# Patient Record
Sex: Female | Born: 1950 | Race: White | Hispanic: No | Smoking: Current every day smoker
Health system: Southern US, Community
[De-identification: ages and names within clinical notes are randomized; demographics above are authoritative.]

## PROBLEM LIST (undated history)

## (undated) DIAGNOSIS — Z961 Presence of intraocular lens: Secondary | ICD-10-CM

## (undated) DIAGNOSIS — Z9289 Personal history of other medical treatment: Secondary | ICD-10-CM

## (undated) DIAGNOSIS — I10 Essential (primary) hypertension: Secondary | ICD-10-CM

## (undated) DIAGNOSIS — F329 Major depressive disorder, single episode, unspecified: Secondary | ICD-10-CM

## (undated) DIAGNOSIS — F32A Depression, unspecified: Secondary | ICD-10-CM

## (undated) DIAGNOSIS — C4491 Basal cell carcinoma of skin, unspecified: Secondary | ICD-10-CM

## (undated) DIAGNOSIS — M549 Dorsalgia, unspecified: Secondary | ICD-10-CM

## (undated) DIAGNOSIS — M5432 Sciatica, left side: Secondary | ICD-10-CM

## (undated) DIAGNOSIS — H43813 Vitreous degeneration, bilateral: Secondary | ICD-10-CM

## (undated) DIAGNOSIS — H25813 Combined forms of age-related cataract, bilateral: Secondary | ICD-10-CM

## (undated) DIAGNOSIS — G459 Transient cerebral ischemic attack, unspecified: Secondary | ICD-10-CM

## (undated) DIAGNOSIS — H52209 Unspecified astigmatism, unspecified eye: Secondary | ICD-10-CM

## (undated) DIAGNOSIS — G5700 Lesion of sciatic nerve, unspecified lower limb: Secondary | ICD-10-CM

## (undated) DIAGNOSIS — R32 Unspecified urinary incontinence: Secondary | ICD-10-CM

## (undated) DIAGNOSIS — J309 Allergic rhinitis, unspecified: Secondary | ICD-10-CM

## (undated) DIAGNOSIS — G379 Demyelinating disease of central nervous system, unspecified: Secondary | ICD-10-CM

## (undated) DIAGNOSIS — H353131 Nonexudative age-related macular degeneration, bilateral, early dry stage: Secondary | ICD-10-CM

## (undated) DIAGNOSIS — H5052 Exophoria: Secondary | ICD-10-CM

## (undated) DIAGNOSIS — L409 Psoriasis, unspecified: Secondary | ICD-10-CM

## (undated) DIAGNOSIS — Z780 Asymptomatic menopausal state: Secondary | ICD-10-CM

## (undated) DIAGNOSIS — H527 Unspecified disorder of refraction: Secondary | ICD-10-CM

## (undated) DIAGNOSIS — N6019 Diffuse cystic mastopathy of unspecified breast: Secondary | ICD-10-CM

## (undated) DIAGNOSIS — IMO0002 Reserved for concepts with insufficient information to code with codable children: Secondary | ICD-10-CM

## (undated) DIAGNOSIS — K219 Gastro-esophageal reflux disease without esophagitis: Secondary | ICD-10-CM

## (undated) HISTORY — DX: Gastro-esophageal reflux disease without esophagitis: K21.9

## (undated) HISTORY — DX: Asymptomatic menopausal state: Z78.0

## (undated) HISTORY — DX: Unspecified disorder of refraction: H52.7

## (undated) HISTORY — DX: Demyelinating disease of central nervous system, unspecified: G37.9

## (undated) HISTORY — DX: Transient cerebral ischemic attack, unspecified: G45.9

## (undated) HISTORY — DX: Depression, unspecified: F32.A

## (undated) HISTORY — DX: Psoriasis, unspecified: L40.9

## (undated) HISTORY — DX: Unspecified astigmatism, unspecified eye: H52.209

## (undated) HISTORY — DX: Dorsalgia, unspecified: M54.9

## (undated) HISTORY — DX: Major depressive disorder, single episode, unspecified: F32.9

## (undated) HISTORY — PX: TONSILLECTOMY AND ADENOIDECTOMY: SHX28

## (undated) HISTORY — DX: Reserved for concepts with insufficient information to code with codable children: IMO0002

## (undated) HISTORY — DX: Nonexudative age-related macular degeneration, bilateral, early dry stage: H35.3131

## (undated) HISTORY — DX: Vitreous degeneration, bilateral: H43.813

## (undated) HISTORY — DX: Essential (primary) hypertension: I10

## (undated) HISTORY — DX: Combined forms of age-related cataract, bilateral: H25.813

## (undated) HISTORY — DX: Lesion of sciatic nerve, unspecified lower limb: G57.00

## (undated) HISTORY — DX: Unspecified urinary incontinence: R32

## (undated) HISTORY — DX: Exophoria: H50.52

## (undated) HISTORY — DX: Diffuse cystic mastopathy of unspecified breast: N60.19

## (undated) HISTORY — DX: Sciatica, left side: M54.32

## (undated) HISTORY — DX: Allergic rhinitis, unspecified: J30.9

## (undated) HISTORY — DX: Basal cell carcinoma of skin, unspecified: C44.91

## (undated) HISTORY — DX: Personal history of other medical treatment: Z92.89

## (undated) HISTORY — DX: Presence of intraocular lens: Z96.1

---

## 1997-07-15 HISTORY — PX: SKIN BIOPSY: SHX1

## 1998-07-15 DIAGNOSIS — G459 Transient cerebral ischemic attack, unspecified: Secondary | ICD-10-CM

## 1998-07-15 HISTORY — DX: Transient cerebral ischemic attack, unspecified: G45.9

## 2001-07-15 DIAGNOSIS — G379 Demyelinating disease of central nervous system, unspecified: Secondary | ICD-10-CM

## 2001-07-15 HISTORY — DX: Demyelinating disease of central nervous system, unspecified: G37.9

## 2001-11-12 HISTORY — PX: CHOLECYSTECTOMY: SHX55

## 2005-04-14 HISTORY — PX: LUMBAR DISC SURGERY: SHX700

## 2007-07-16 DIAGNOSIS — L409 Psoriasis, unspecified: Secondary | ICD-10-CM

## 2007-07-16 HISTORY — DX: Psoriasis, unspecified: L40.9

## 2012-12-13 DIAGNOSIS — Z9289 Personal history of other medical treatment: Secondary | ICD-10-CM

## 2012-12-13 HISTORY — DX: Personal history of other medical treatment: Z92.89

## 2013-05-15 DIAGNOSIS — M5432 Sciatica, left side: Secondary | ICD-10-CM

## 2013-05-15 DIAGNOSIS — G5700 Lesion of sciatic nerve, unspecified lower limb: Secondary | ICD-10-CM

## 2013-05-15 HISTORY — DX: Sciatica, left side: M54.32

## 2013-05-15 HISTORY — DX: Lesion of sciatic nerve, unspecified lower limb: G57.00

## 2014-06-22 DIAGNOSIS — I6781 Acute cerebrovascular insufficiency: Secondary | ICD-10-CM | POA: Diagnosis not present

## 2014-06-22 DIAGNOSIS — E782 Mixed hyperlipidemia: Secondary | ICD-10-CM | POA: Diagnosis not present

## 2014-06-22 DIAGNOSIS — Z6830 Body mass index (BMI) 30.0-30.9, adult: Secondary | ICD-10-CM | POA: Diagnosis not present

## 2014-06-22 DIAGNOSIS — I10 Essential (primary) hypertension: Secondary | ICD-10-CM | POA: Diagnosis not present

## 2014-06-22 DIAGNOSIS — M65311 Trigger thumb, right thumb: Secondary | ICD-10-CM | POA: Diagnosis not present

## 2014-07-04 DIAGNOSIS — M65311 Trigger thumb, right thumb: Secondary | ICD-10-CM | POA: Diagnosis not present

## 2014-07-04 DIAGNOSIS — M79644 Pain in right finger(s): Secondary | ICD-10-CM | POA: Diagnosis not present

## 2014-08-09 ENCOUNTER — Ambulatory Visit (INDEPENDENT_AMBULATORY_CARE_PROVIDER_SITE_OTHER): Payer: Medicare Other | Admitting: Podiatry

## 2014-08-09 ENCOUNTER — Encounter: Payer: Self-pay | Admitting: Podiatry

## 2014-08-09 VITALS — BP 121/81 | HR 63 | Resp 18

## 2014-08-09 DIAGNOSIS — L600 Ingrowing nail: Secondary | ICD-10-CM | POA: Diagnosis not present

## 2014-08-09 NOTE — Patient Instructions (Signed)

## 2014-08-09 NOTE — Progress Notes (Signed)
   Subjective:    Patient ID: [name], female    DOB: 06-Apr-1951, 64 y.o.   MRN: [id number]  HPI  64 year old female percent the office today with complaints of painful ingrown toenail to the left big toe. She states that she also has and discomfort to the right side on the left side is more severe. 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 She states that she started using a pedicure weekly Norditropin ingrown toenails. She states that her last appointment to get a pedicure they had difficulty trimming the left toenail and the area was very painful. She denies any recent redness or drainage from the nail site. No other complaints at this time.    Review of Systems  All other systems reviewed and are negative.      Objective:   Physical Exam AAO x3, NAD DP/PT pulses palpable bilaterally, CRT less than 3 seconds Protective sensation intact with Simms Weinstein monofilament, vibratory sensation intact, Achilles tendon reflex intact. Tenderness to palpation overlying the medial and lateral nail borders of the left hallux evidence of incurvation of ingrowing. There is no overlying erythema or increasing warmth. There is trace edema directly on the nail borders. No ascending cellulitis. There is no discomfort currently overlying the nail borders of the right hallux. The hallux nails do appear to be hypertrophic, dystrophic, discolored and brittle. Remaining nails do have slight discoloration to them as well. There is no tenderness along the lesser digit nails. No open lesions or pre-ulcer lesions identified. No other areas of tenderness to bilateral lower extremity. No other areas of edema, erythema, increased warmth bilateral lower extremities. MMT 5/5, ROM WNL No pain with calf compression, swelling, warmth, erythema.    Assessment & Plan:  64 year old female with symptomatic ingrown toenail left medial lateral  nail borders. -Treatment options were discussed the patient including alternatives, risks, complications. -Discussed the patient partial nail avulsion with chemical matricectomy to both medial and lateral nail borders of left hallux. Risks and complications of the procedure were discussed the patient for which she understands verbally consents to the procedure. Once the site was properly identified the left hallux was then infiltrated with 3 mL of a one-to-one mixture of 2% lidocaine plain and 0.5% Marcaine plain. Once anesthetized, the skin was then prepped in sterile fashion. A tourniquet was then applied. Next the medial and lateral nail borders were then sharply excised making sure to remove the entire offending nail border. No underlying purulence is expressed. There is found to be significant amount of ingrowing on the nail borders. Once the nail borders were assured to be removed phenol was then applied under standard conditions and the copious irrigated. This was performed on both nail borders. Betadine ointment was then applied followed by a dry sterile dressing. After application of the dressing the tourniquet was removed and there is found to be an immediate capillary refill time to the digit. The patient tolerated the procedure well without any complications. Post procedure suctions were discussed the patient for which she verbally understood. Monitor for any signs or symptoms of infection and directed to call the office immediately should any occur. -Follow-up in 1 week or sooner should any problems arise. In the meantime, encouraged to call the office with any questions, concerns, change in symptoms.

## 2014-08-10 ENCOUNTER — Telehealth: Payer: Self-pay | Admitting: *Deleted

## 2014-08-10 NOTE — Telephone Encounter (Addendum)
Pt states there are multiple areas on her check-out sheet that are incorrect.  Pt states the only medications she takes are Aspirin 325mg  daily, Losartan HCTZ 100/25mg  daily and has NEVER taken Norco.  Pt also states  She quit smoking over 9 years ago and it was charted as NEVER smoked.  Pt states there was a B/P and pulse also for that day, that wasn't taken at that visit.  Pt would like these areas corrected.

## 2014-08-24 ENCOUNTER — Ambulatory Visit: Payer: Medicare Other | Admitting: Podiatry

## 2014-08-31 ENCOUNTER — Ambulatory Visit (INDEPENDENT_AMBULATORY_CARE_PROVIDER_SITE_OTHER): Payer: Medicare Other | Admitting: Podiatry

## 2014-08-31 ENCOUNTER — Encounter: Payer: Self-pay | Admitting: Podiatry

## 2014-08-31 VITALS — BP 149/81 | HR 84 | Resp 18

## 2014-08-31 DIAGNOSIS — L600 Ingrowing nail: Secondary | ICD-10-CM

## 2014-08-31 NOTE — Patient Instructions (Signed)

## 2014-09-01 NOTE — Progress Notes (Addendum)
Patient ID: [name], female   DOB: 1950-09-06, 64 y.o.   MRN: [id number]  Subjective: 64 year old female returns the office today requesting nail procedure to the right big toe. Last appointment she had ingrown toenail removed with chemical matricectomy on the left hallux for which she stated is healing well. She states that the area no longer has any pain. She denies any drainage or purulence from the area and denies any surrounding erythema or streaking. She states of the right hallux has continued to be painful and she would likie to proceed with the procedure at this time. Denies any systemic complaints such as fevers, chills, nausea, vomiting. No other complaints at this time in no acute changes.  Objective: AAO 3, NAD DP/PT pulses palpable, CRT less than 3 seconds Protective sensation intact with Simms Weinstein monofilament, vibratory sensation intact, Achilles tendon reflex intact. Left hallux status post medial/lateral partial nail excision which is healing well for this timeframe. Is a scab formed overlying the area. There is no tenderness to palpation. There is no swelling erythema, edema, increased warmth. No ascending cellulitis. No drainage or purulence. No malodor. Left second digit with incurvation along the lateral aspect of the nail with mild tenderness. There is mild erythema over the nail border directly without any ascending cellulitis. There is no drainage or purulence. Right hallux with evidence of ingrowing on both the medial and lateral nail borders. There is tenderness to palpation overlying these nail borders. There is no surrounding erythema or increase in warmth. There is trace edema directly overlying the nail borders. There is no drainage or purulence expressed. No malodor. Remaining nails without pathology. No other open lesions or pre-ulcerative lesions identified. No pain with calf compression, swelling, warmth, erythema.  Assessment: 64 year old female  presents for right symptomatic ingrown toenail  Plan: -At this time patient is requesting partial nail avulsions the chemical matricectomy to both the medial and lateral nail borders of the right hallux. Risks and complications of the procedure were discussed which understands and verbally consents. After the site was properly identified a total of 3 mL of a mixture of 2% lidocaine plain and 0.5% Marcaine plain was infiltrated in a hallux block fashion. Once anesthetized the skin was then prepped in a sterile fashion. A tourniquet was then applied. Next both the medial and lateral nail borders were then sharply excised making sure to remove the entire offending nail border. There is found to be extensive amount of ingrowing along the nail borders. Once the nails were shortened to be remove the area was debrided and the underlying skin was intact. There is no purulence or other signs of infection present. Hemostasis was achieved. Phenol was then applied under standard conditions and copiously irrigated. Betadine ointment was applied followed by dry sterile dressing. After application of the dressing the tourniquet was removed and there is found to be an immediate capillary refill time to the digit. Patient tolerated the procedure well without any complications. Post procedure instructions were discussed the patient for which she understood. -Left second digit nail debrided without complication/bleeding. Continue to monitor the area. Discussed that if the area worsens or does not improve would likely need to have partial nail avulsion. -Follow-up in 1 week or sooner if any problems are to arise. In the meantime, encouraged call the office with any questions, concerns, change in symptoms. Monitoring signs or symptoms of infection and directed to call the office immediately lesion any occur or go to the emergency room.

## 2014-09-14 ENCOUNTER — Encounter: Payer: Self-pay | Admitting: Podiatry

## 2014-09-14 ENCOUNTER — Ambulatory Visit (INDEPENDENT_AMBULATORY_CARE_PROVIDER_SITE_OTHER): Payer: Medicare Other | Admitting: Podiatry

## 2014-09-14 VITALS — BP 125/75 | HR 76 | Resp 18

## 2014-09-14 DIAGNOSIS — L600 Ingrowing nail: Secondary | ICD-10-CM

## 2014-09-14 NOTE — Patient Instructions (Signed)

## 2014-09-19 NOTE — Progress Notes (Signed)
Patient ID: [name], female   DOB: 1950-08-20, 64 y.o.   MRN: [id number]  Subjective: 64 year old female presents the office they for follow-up evaluation of bilateral hallux partial nail avulsions. She states that she is doing well and she has no pain overlying the area. She denies any drainage or purulence. Denies any redness. She does state than the right fifth toenail has started to become mildly painful. She states that she might of Brothers toe and the shoe causing some irritation. She denies any history of injury or trauma. She denies any redness or drainage line nail site. No other complaints at this time.  Objective: AAO 3, NAD DP/PT pulses palpable, CRT less than 3 seconds Protective sensation intact with Simms Weinstein monofilament, vibratory sensation intact, Achilles tendon reflex intact. Bilateral hallux status post partial nail avulsion which are healing well. There is a small amount of granulation tissue within the nail borders however overall the sites are healing well. There is no tenderness palpation and there is no drainage or purulence. There is no swelling erythema or ascending cellulitis. There is no areas of fluctuance or crepitus or malodor. No clinical signs of infection. On the right fifth toenail there is some evidence of a mild subungual hematoma which encompasses less than 25% of the nail. She denies any history of injury or trauma. There is mild incurvation on the nail border without any surrounding erythema or drainage or other clinical signs of infection. Remaining nails without pathology. No other areas of tenderness to bilateral lower extremity is. No overlying edema, erythema, increase in warmth. No pain with calf compression, swelling, warmth, erythema.  Assessment: 64 year old female status post bilateral hallux partial nail avulsion with right fifth digit toenail discomfort   Plan: -Treatment options were discussed including alternatives, risks,  complications. -Recommended continue soaking the hallux nails in Epson salts twice a day covered with antibiotic ointment and a Band-Aid. Continue this until area has completely healed. Can leave the area uncovered at night. -Right fifth digit nail was sharply debrided without complications to patient comfort. Discussed the patient closely monitor the area. The area was becomes symptomatic again or if there is any changes to call the office. -Follow-up as needed. In the meantime encouraged to call the office with any questions, concerns, changes symptoms.

## 2014-10-05 DIAGNOSIS — L600 Ingrowing nail: Secondary | ICD-10-CM

## 2014-12-22 DIAGNOSIS — I10 Essential (primary) hypertension: Secondary | ICD-10-CM | POA: Diagnosis not present

## 2014-12-22 DIAGNOSIS — R203 Hyperesthesia: Secondary | ICD-10-CM | POA: Diagnosis not present

## 2014-12-22 DIAGNOSIS — E782 Mixed hyperlipidemia: Secondary | ICD-10-CM | POA: Diagnosis not present

## 2014-12-27 DIAGNOSIS — Z0001 Encounter for general adult medical examination with abnormal findings: Secondary | ICD-10-CM | POA: Diagnosis not present

## 2014-12-27 DIAGNOSIS — Z1211 Encounter for screening for malignant neoplasm of colon: Secondary | ICD-10-CM | POA: Diagnosis not present

## 2014-12-27 DIAGNOSIS — Z124 Encounter for screening for malignant neoplasm of cervix: Secondary | ICD-10-CM | POA: Diagnosis not present

## 2016-01-03 ENCOUNTER — Ambulatory Visit: Payer: Self-pay | Admitting: Family Medicine

## 2016-01-04 ENCOUNTER — Encounter: Payer: Self-pay | Admitting: Family Medicine

## 2016-01-04 ENCOUNTER — Other Ambulatory Visit: Payer: Self-pay | Admitting: Family Medicine

## 2016-01-11 ENCOUNTER — Ambulatory Visit (INDEPENDENT_AMBULATORY_CARE_PROVIDER_SITE_OTHER): Payer: Medicare Other | Admitting: Family Medicine

## 2016-01-11 ENCOUNTER — Encounter: Payer: Self-pay | Admitting: Family Medicine

## 2016-01-11 VITALS — BP 137/88 | HR 73 | Temp 97.9°F | Resp 20 | Ht 64.0 in | Wt 192.8 lb

## 2016-01-11 DIAGNOSIS — Z7189 Other specified counseling: Secondary | ICD-10-CM

## 2016-01-11 DIAGNOSIS — Z6833 Body mass index (BMI) 33.0-33.9, adult: Secondary | ICD-10-CM

## 2016-01-11 DIAGNOSIS — Z8673 Personal history of transient ischemic attack (TIA), and cerebral infarction without residual deficits: Secondary | ICD-10-CM | POA: Diagnosis not present

## 2016-01-11 DIAGNOSIS — H6120 Impacted cerumen, unspecified ear: Secondary | ICD-10-CM | POA: Insufficient documentation

## 2016-01-11 DIAGNOSIS — H6123 Impacted cerumen, bilateral: Secondary | ICD-10-CM

## 2016-01-11 DIAGNOSIS — I10 Essential (primary) hypertension: Secondary | ICD-10-CM | POA: Insufficient documentation

## 2016-01-11 DIAGNOSIS — E782 Mixed hyperlipidemia: Secondary | ICD-10-CM | POA: Insufficient documentation

## 2016-01-11 DIAGNOSIS — Z7689 Persons encountering health services in other specified circumstances: Secondary | ICD-10-CM

## 2016-01-11 DIAGNOSIS — Z1211 Encounter for screening for malignant neoplasm of colon: Secondary | ICD-10-CM

## 2016-01-11 DIAGNOSIS — Z0000 Encounter for general adult medical examination without abnormal findings: Secondary | ICD-10-CM

## 2016-01-11 DIAGNOSIS — Z1159 Encounter for screening for other viral diseases: Secondary | ICD-10-CM

## 2016-01-11 MED ORDER — LOSARTAN POTASSIUM-HCTZ 100-25 MG PO TABS: 1.0000 | ORAL_TABLET | Freq: Every day | ORAL | Status: DC

## 2016-01-11 MED ORDER — CARBAMIDE PEROXIDE 6.5 % OT SOLN: 5.0000 [drp] | Freq: Two times a day (BID) | OTIC | Status: DC

## 2016-01-11 NOTE — Patient Instructions (Signed)
I have placed future lab orders for you to complete fasting when you return from vacation.  I have called in refills for BP medicine.  Information on cologuard (colon cancer screen) will be sent to you.  I have called in drops to help with ear wax removal   Health Maintenance, Female Adopting a healthy lifestyle and getting preventive care can go a long way to promote health and wellness. Talk with your health care provider about what schedule of regular examinations is right for you. This is a good chance for you to check in with your provider about disease prevention and staying healthy. In between checkups, there are plenty of things you can do on your own. Experts have done a lot of research about which lifestyle changes and preventive measures are most likely to keep you healthy. Ask your health care provider for more information. WEIGHT AND DIET  Eat a healthy diet  Be sure to include plenty of vegetables, fruits, low-fat dairy products, and lean protein.  Do not eat a lot of foods high in solid fats, added sugars, or salt.  Get regular exercise. This is one of the most important things you can do for your health.  Most adults should exercise for at least 150 minutes each week. The exercise should increase your heart rate and make you sweat (moderate-intensity exercise).  Most adults should also do strengthening exercises at least twice a week. This is in addition to the moderate-intensity exercise.  Maintain a healthy weight  Body mass index (BMI) is a measurement that can be used to identify possible weight problems. It estimates body fat based on height and weight. Your health care provider can help determine your BMI and help you achieve or maintain a healthy weight.  For females 23 years of age and older:   A BMI below 18.5 is considered underweight.  A BMI of 18.5 to 24.9 is normal.  A BMI of 25 to 29.9 is considered overweight.  A BMI of 30 and above is considered  obese.  Watch levels of cholesterol and blood lipids  You should start having your blood tested for lipids and cholesterol at 67 years of age, then have this test every 5 years.  You may need to have your cholesterol levels checked more often if:  Your lipid or cholesterol levels are high.  You are older than 67 years of age.  You are at high risk for heart disease.  CANCER SCREENING   Lung Cancer  Lung cancer screening is recommended for adults 67-70 years old who are at high risk for lung cancer because of a history of smoking.  A yearly low-dose CT scan of the lungs is recommended for people who:  Currently smoke.  Have quit within the past 15 years.  Have at least a 30-pack-year history of smoking. A pack year is smoking an average of one pack of cigarettes a day for 1 year.  Yearly screening should continue until it has been 15 years since you quit.  Yearly screening should stop if you develop a health problem that would prevent you from having lung cancer treatment.  Breast Cancer  Practice breast self-awareness. This means understanding how your breasts normally appear and feel.  It also means doing regular breast self-exams. Let your health care provider know about any changes, no matter how small.  If you are in your 20s or 30s, you should have a clinical breast exam (CBE) by a health care provider every 1-3  years as part of a regular health exam.  If you are 80 or older, have a CBE every year. Also consider having a breast X-ray (mammogram) every year.  If you have a family history of breast cancer, talk to your health care provider about genetic screening.  If you are at high risk for breast cancer, talk to your health care provider about having an MRI and a mammogram every year.  Breast cancer gene (BRCA) assessment is recommended for women who have family members with BRCA-related cancers. BRCA-related cancers  include:  Breast.  Ovarian.  Tubal.  Peritoneal cancers.  Results of the assessment will determine the need for genetic counseling and BRCA1 and BRCA2 testing. Cervical Cancer Your health care provider may recommend that you be screened regularly for cancer of the pelvic organs (ovaries, uterus, and vagina). This screening involves a pelvic examination, including checking for microscopic changes to the surface of your cervix (Pap test). You may be encouraged to have this screening done every 3 years, beginning at age 21.  For women ages 22-65, health care providers may recommend pelvic exams and Pap testing every 3 years, or they may recommend the Pap and pelvic exam, combined with testing for human papilloma virus (HPV), every 5 years. Some types of HPV increase your risk of cervical cancer. Testing for HPV may also be done on women of any age with unclear Pap test results.  Other health care providers may not recommend any screening for nonpregnant women who are considered low risk for pelvic cancer and who do not have symptoms. Ask your health care provider if a screening pelvic exam is right for you.  If you have had past treatment for cervical cancer or a condition that could lead to cancer, you need Pap tests and screening for cancer for at least 20 years after your treatment. If Pap tests have been discontinued, your risk factors (such as having a new sexual partner) need to be reassessed to determine if screening should resume. Some women have medical problems that increase the chance of getting cervical cancer. In these cases, your health care provider may recommend more frequent screening and Pap tests. Colorectal Cancer  This type of cancer can be detected and often prevented.  Routine colorectal cancer screening usually begins at 67 years of age and continues through 67 years of age.  Your health care provider may recommend screening at an earlier age if you have risk factors for  colon cancer.  Your health care provider may also recommend using home test kits to check for hidden blood in the stool.  A small camera at the end of a tube can be used to examine your colon directly (sigmoidoscopy or colonoscopy). This is done to check for the earliest forms of colorectal cancer.  Routine screening usually begins at age 7.  Direct examination of the colon should be repeated every 5-10 years through 67 years of age. However, you may need to be screened more often if early forms of precancerous polyps or small growths are found. Skin Cancer  Check your skin from head to toe regularly.  Tell your health care provider about any new moles or changes in moles, especially if there is a change in a mole's shape or color.  Also tell your health care provider if you have a mole that is larger than the size of a pencil eraser.  Always use sunscreen. Apply sunscreen liberally and repeatedly throughout the day.  Protect yourself by wearing long  sleeves, pants, a wide-brimmed hat, and sunglasses whenever you are outside. HEART DISEASE, DIABETES, AND HIGH BLOOD PRESSURE   High blood pressure causes heart disease and increases the risk of stroke. High blood pressure is more likely to develop in:  People who have blood pressure in the high end of the normal range (130-139/85-89 mm Hg).  People who are overweight or obese.  People who are African American.  If you are 61-23 years of age, have your blood pressure checked every 3-5 years. If you are 72 years of age or older, have your blood pressure checked every year. You should have your blood pressure measured twice--once when you are at a hospital or clinic, and once when you are not at a hospital or clinic. Record the average of the two measurements. To check your blood pressure when you are not at a hospital or clinic, you can use:  An automated blood pressure machine at a pharmacy.  A home blood pressure monitor.  If you  are between 59 years and 56 years old, ask your health care provider if you should take aspirin to prevent strokes.  Have regular diabetes screenings. This involves taking a blood sample to check your fasting blood sugar level.  If you are at a normal weight and have a low risk for diabetes, have this test once every three years after 67 years of age.  If you are overweight and have a high risk for diabetes, consider being tested at a younger age or more often. PREVENTING INFECTION  Hepatitis B  If you have a higher risk for hepatitis B, you should be screened for this virus. You are considered at high risk for hepatitis B if:  You were born in a country where hepatitis B is common. Ask your health care provider which countries are considered high risk.  Your parents were born in a high-risk country, and you have not been immunized against hepatitis B (hepatitis B vaccine).  You have HIV or AIDS.  You use needles to inject street drugs.  You live with someone who has hepatitis B.  You have had sex with someone who has hepatitis B.  You get hemodialysis treatment.  You take certain medicines for conditions, including cancer, organ transplantation, and autoimmune conditions. Hepatitis C  Blood testing is recommended for:  Everyone born from [birth year] through 1967.  Anyone with known risk factors for hepatitis C. Sexually transmitted infections (STIs)  You should be screened for sexually transmitted infections (STIs) including gonorrhea and chlamydia if:  You are sexually active and are younger than 67 years of age.  You are older than 67 years of age and your health care provider tells you that you are at risk for this type of infection.  Your sexual activity has changed since you were last screened and you are at an increased risk for chlamydia or gonorrhea. Ask your health care provider if you are at risk.  If you do not have HIV, but are at risk, it may be recommended that you  take a prescription medicine daily to prevent HIV infection. This is called pre-exposure prophylaxis (PrEP). You are considered at risk if:  You are sexually active and do not regularly use condoms or know the HIV status of your partner(s).  You take drugs by injection.  You are sexually active with a partner who has HIV. Talk with your health care provider about whether you are at high risk of being infected with HIV. If you choose  to begin PrEP, you should first be tested for HIV. You should then be tested every 3 months for as long as you are taking PrEP.  PREGNANCY   If you are premenopausal and you may become pregnant, ask your health care provider about preconception counseling.  If you may become pregnant, take 400 to 800 micrograms (mcg) of folic acid every day.  If you want to prevent pregnancy, talk to your health care provider about birth control (contraception). OSTEOPOROSIS AND MENOPAUSE   Osteoporosis is a disease in which the bones lose minerals and strength with aging. This can result in serious bone fractures. Your risk for osteoporosis can be identified using a bone density scan.  If you are 74 years of age or older, or if you are at risk for osteoporosis and fractures, ask your health care provider if you should be screened.  Ask your health care provider whether you should take a calcium or vitamin D supplement to lower your risk for osteoporosis.  Menopause may have certain physical symptoms and risks.  Hormone replacement therapy may reduce some of these symptoms and risks. Talk to your health care provider about whether hormone replacement therapy is right for you.  HOME CARE INSTRUCTIONS   Schedule regular health, dental, and eye exams.  Stay current with your immunizations.   Do not use any tobacco products including cigarettes, chewing tobacco, or electronic cigarettes.  If you are pregnant, do not drink alcohol.  If you are breastfeeding, limit how  much and how often you drink alcohol.  Limit alcohol intake to no more than 1 drink per day for nonpregnant women. One drink equals 12 ounces of beer, 5 ounces of wine, or 1 ounces of hard liquor.  Do not use street drugs.  Do not share needles.  Ask your health care provider for help if you need support or information about quitting drugs.  Tell your health care provider if you often feel depressed.  Tell your health care provider if you have ever been abused or do not feel safe at home.   This information is not intended to replace advice given to you by your health care provider. Make sure you discuss any questions you have with your health care provider.   Document Released: 01/14/2011 Document Revised: 07/22/2014 Document Reviewed: 06/02/2013 Elsevier Interactive Patient Education [employer].

## 2016-01-11 NOTE — Progress Notes (Signed)
Patient ID: [name], female   DOB: 03/19/1951, 65 y.o.   MRN: [id number]      Patient ID: [name], female  DOB: 25-Mar-1951, 65 y.o.   MRN: [id number]  Subjective:  [name] is a 65 y.o.  female present for establishment of care.  All past medical history, surgical history, allergies, family history, immunizations, medications and social history were obtained and entered in the electronic medical record today. All recent labs, ED visits and hospitalizations within the last year were reviewed. Patient Care Team    Relationship Specialty Notifications Start End  [name], DO PCP - General Family Medicine  01/04/16   [name], DPM Consulting Physician Podiatry  01/12/16     Health maintenance:  Colonoscopy: pt declines colonoscopy, she has never had one and does not desire one. Discussed colo-guard testing, and she is agreeable to this. Colo-guard form signed. FOBT negative 2016. Mammogram: no fhx, declines mammogram. SBE.  Cervical cancer screening: completed (2016) normal. > 65, not indicated. Last menstrual cycle at age 69.  Immunizations: tdap 2015, zostavax completed, declines flu always. She will think about PNA series, currenlty declines.  Infectious disease screening: Pt agreeable to Hep C screening  DEXA:declines future screening. She does take vit D daily.  Assistive device: None  Patient has a Dental home. Hospitalizations/ED visits: None Eye Exam: 12/2015- Vision exam, no barriers.  Hearing: no barriers identified. Cognitve exam: No barriers identified.  Get up and go test: steady <20s. Foot exam 09/2014  Depression screen [facility] 2/9 01/11/2016  Decreased Interest 0  Down, Depressed, Hopeless 0  PHQ - 2 Score 0    Current Exercise Habits: Structured exercise class, Type of exercise: calisthenics, Time (Minutes): 45, Frequency (Times/Week): 3, Weekly Exercise (Minutes/Week): 135, Intensity: Mild      Functional Status Survey: Is the patient  deaf or have difficulty hearing?: No Does the patient have difficulty seeing, even when wearing glasses/contacts?: No Does the patient have difficulty concentrating, remembering, or making decisions?: No Does the patient have difficulty walking or climbing stairs?: No Does the patient have difficulty dressing or bathing?: No Does the patient have difficulty doing errands alone such as visiting a doctor's office or shopping?: No  Lipid Panel  No results found for: CHOL, TRIG, HDL, CHOLHDL, VLDL, LDLCALC, LDLDIRECT - last lipids collected 2015 at prior PCP. Pt has hyperlipidemia on fish oil.  - total chol 153, trig 126,vldl 25,hdl 43, ldl85 Advanced Directives: Pt has advanced care plan, living will and HPOA. Copy requested.   Diabetes screen: - last glucose collected at prior PCP 2015. - a1c 5.1, glucose 85  Past Medical History  Diagnosis Date  . Hypertension   . Urinary incontinence   . Allergic rhinitis   . Fibrocystic breast   . GERD (gastroesophageal reflux disease)   . 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 Postmenopausal   . TIA (transient ischemic attack) 2000  . Psoriasis 2009  . Back pain     Dr. Karin Lieu ([facility])  . History of echocardiogram 12/2012    Dr. Daiva Huge: EF55-60%, mild TR, trace pulmonic and MR, abnl left vent diastolic  . Piriformis syndrome 05/2013  . Sciatica of left side 05/2013   Allergies  Allergen Reactions  . Sulfa Antibiotics    Past Surgical History  Procedure Laterality Date  . Skin biopsy Left 1999    left hand bcc vs scc  . Cholecystectomy  11/2001  . Lumbar disc surgery  04/2005    R L4-5  and L5-S1  . Tonsillectomy and adenoidectomy     Family History  Problem Relation Age of Onset  . Heart disease Father 63    Mi at 88; died  . Diabetes Father   . Rheum arthritis Mother   . Alcohol abuse Mother   . Dementia Maternal Grandmother    Social History   Social History    . Marital Status: Married    Spouse Name: N/A  . Number of Children: N/A  . Years of Education: N/A   Occupational History  . Not on file.   Social History Main Topics  . Smoking status: Former Research scientist (life sciences)  . Smokeless tobacco: Never Used  . Alcohol Use: 1.2 oz/week    0 Standard drinks or equivalent, 2 Cans of beer per week  . Drug Use: No  . Sexual Activity: No   Other Topics Concern  . Not on file   Social History Narrative   Married to [employer]. 2 sons.   College. Retired.    Former smoker (quit 04/2005- 30+ pack year), occasional etoh, no drugs   Drinks caffeine, uses herbal remedies, daily vitamin use   Wears her seatbelt, wears her bicycle helmet   Exercises routinely   Smoke detector in the home.    Firearms in the home (locked)   Feels safe in her relationships.    ROS: 14 pt review of systems performed and negative (unless mentioned in an HPI)  Objective: BP 137/88 mmHg  Pulse 73  Temp(Src) 97.9 F (36.6 C)  Resp 20  Ht 5\' 4"  (1.626 m)  Wt 192 lb 12.8 oz (87.454 kg)  BMI 33.08 kg/m2  SpO2 95% Gen: Afebrile. No acute distress. Nontoxic in appearance, well-developed, well-nourished, female, pleasant  HENT: AT. [place]. Bilateral TM visualized and normal in appearance, normal external auditory canal. MMM, no oral lesions, good dentition. Bilateral nares WNL. Throat without erythema, ulcerations or exudates. no Cough on exam, no hoarseness on exam. Eyes:Pupils Equal Round Reactive to light, Extraocular movements intact,  Conjunctiva without redness, discharge or icterus. Neck/lymp/endocrine: Supple,no lymphadenopathy, no thyromegaly CV: RRR no murmur, no edema, +2/4 P posterior tibialis pulses.  Chest: CTAB, no wheeze, rhonchi or crackles.  Abd: Soft. Obese. NTND. BS present. no Masses palpated. No hepatosplenomegaly. No rebound tenderness or guarding. Skin: no rashes, purpura or petechiae. Warm and well-perfused. Skin intact. Neuro/Msk: Normal gait. PERLA. EOMi. Alert.  Oriented x3.   Psych: Normal affect, dress and demeanor. Normal speech. Normal thought content and judgment.   Assessment/plan: [name] is a 65 y.o. female present for establishment of care/Medicare wellness: Medicare annual wellness visit, subsequent - Cologuard; Future--> Colonoscopy: pt declines colonoscopy, she has never had one and does not desire one. Discussed colo-guard testing, and she is agreeable to this. Colo-guard form signed.  -Mammogram: no fhx, declines mammogram. SBE.  - Cervical cancer screening: completed last year, it was normal. > 65, not indicated. Last menstrual cycle at age 34.  - Immunizations: tdap 2015, zostavax completed, declines flu always. She will think about PNA series, currenlty declines.  - Hepatitis c antibody (reflex); Future--> Pt agreeable to Hep C screening  - DEXA:declines future screening. She does take vit D daily.    Essential hypertension, benign - stable. Encouraged exercise and low salt diet.  - losartan-hydrochlorothiazide (HYZAAR) 100-25 MG tablet; Take 1 tablet by mouth daily.  Dispense: 90 tablet; Refill: 1 - Lipid panel; Future -  CBC w/Diff; Future - COMPLETE METABOLIC PANEL WITH GFR; Future - F/U 6 months  History of transient ischemic attack/<ixed Hyperlipidemia - continue ASA and fish oil (does not desire statin) - Lipid panel; Future - CBC w/Diff; Future - COMPLETE METABOLIC PANEL WITH GFR; Future  Adult BMI 33.0-33.9 kg/sq m - encouraged healthy diet and exercise >150 minutes a week.  Cerumen impaction, bilateral - AVOID QTIP use.  - carbamide peroxide (DEBROX) 6.5 % otic solution; Place 5 drops into both ears 2 (two) times daily.  Dispense: 15 mL; Refill: 0  Electronically Signed by: Howard Pouch, DO [place] [facility]- OR

## 2016-01-12 DIAGNOSIS — Z0000 Encounter for general adult medical examination without abnormal findings: Secondary | ICD-10-CM | POA: Insufficient documentation

## 2016-01-12 DIAGNOSIS — Z1211 Encounter for screening for malignant neoplasm of colon: Secondary | ICD-10-CM | POA: Insufficient documentation

## 2016-01-25 ENCOUNTER — Other Ambulatory Visit (INDEPENDENT_AMBULATORY_CARE_PROVIDER_SITE_OTHER): Payer: Medicare Other

## 2016-01-25 DIAGNOSIS — Z8673 Personal history of transient ischemic attack (TIA), and cerebral infarction without residual deficits: Secondary | ICD-10-CM

## 2016-01-25 DIAGNOSIS — E782 Mixed hyperlipidemia: Secondary | ICD-10-CM | POA: Diagnosis not present

## 2016-01-25 DIAGNOSIS — Z6833 Body mass index (BMI) 33.0-33.9, adult: Secondary | ICD-10-CM

## 2016-01-25 DIAGNOSIS — Z1159 Encounter for screening for other viral diseases: Secondary | ICD-10-CM

## 2016-01-25 DIAGNOSIS — I10 Essential (primary) hypertension: Secondary | ICD-10-CM

## 2016-01-25 LAB — CBC WITH DIFFERENTIAL/PLATELET
BASOS ABS: 0 10*3/uL (ref 0.0–0.1)
BASOS PCT: 0.4 % (ref 0.0–3.0)
EOS PCT: 1.2 % (ref 0.0–5.0)
Eosinophils Absolute: 0.1 10*3/uL (ref 0.0–0.7)
HEMATOCRIT: 42.5 % (ref 36.0–46.0)
Hemoglobin: 14.7 g/dL (ref 12.0–15.0)
LYMPHS PCT: 27 % (ref 12.0–46.0)
Lymphs Abs: 1.6 10*3/uL (ref 0.7–4.0)
MCHC: 34.6 g/dL (ref 30.0–36.0)
MCV: 92.7 fl (ref 78.0–100.0)
MONOS PCT: 8 % (ref 3.0–12.0)
Monocytes Absolute: 0.5 10*3/uL (ref 0.1–1.0)
NEUTROS ABS: 3.6 10*3/uL (ref 1.4–7.7)
Neutrophils Relative %: 63.4 % (ref 43.0–77.0)
PLATELETS: 174 10*3/uL (ref 150.0–400.0)
RBC: 4.58 Mil/uL (ref 3.87–5.11)
RDW: 13.3 % (ref 11.5–15.5)
WBC: 5.7 10*3/uL (ref 4.0–10.5)

## 2016-01-25 LAB — COMPLETE METABOLIC PANEL WITH GFR
ALT: 18 U/L (ref 6–29)
AST: 19 U/L (ref 10–35)
Albumin: 4 g/dL (ref 3.6–5.1)
Alkaline Phosphatase: 69 U/L (ref 33–130)
BILIRUBIN TOTAL: 1 mg/dL (ref 0.2–1.2)
BUN: 26 mg/dL — ABNORMAL HIGH (ref 7–25)
CHLORIDE: 107 mmol/L (ref 98–110)
CO2: 30 mmol/L (ref 20–31)
Calcium: 9.2 mg/dL (ref 8.6–10.4)
Creat: 0.62 mg/dL (ref 0.50–0.99)
GFR, Est African American: >89 mL/min (ref >=60)
GLUCOSE: 97 mg/dL (ref 65–99)
Potassium: 4.8 mmol/L (ref 3.5–5.3)
SODIUM: 145 mmol/L (ref 135–146)
TOTAL PROTEIN: 6.2 g/dL (ref 6.1–8.1)

## 2016-01-26 ENCOUNTER — Telehealth: Payer: Self-pay | Admitting: Family Medicine

## 2016-01-26 LAB — LIPID PANEL
CHOLESTEROL: 175 mg/dL (ref 0–200)
HDL: 36.9 mg/dL — ABNORMAL LOW (ref >39.00)
LDL Cholesterol: 107 mg/dL — ABNORMAL HIGH (ref 0–99)
NonHDL: 138.07
TRIGLYCERIDES: 157 mg/dL — AB (ref 0.0–149.0)
Total CHOL/HDL Ratio: 5
VLDL: 31.4 mg/dL (ref 0.0–40.0)

## 2016-01-26 LAB — HEPATITIS C ANTIBODY: HCV Ab: NEGATIVE

## 2016-01-26 NOTE — Telephone Encounter (Signed)
Please call pt: - her labs are stable.  - Her triglycerides are still a little higher than desired, if she increases her fish oil she will benefit. She should take 2- 3g daily.

## 2016-01-26 NOTE — Telephone Encounter (Signed)
Spoke with patient reviewed lab results and instructions. 

## 2016-05-13 ENCOUNTER — Ambulatory Visit (INDEPENDENT_AMBULATORY_CARE_PROVIDER_SITE_OTHER): Payer: Medicare Other | Admitting: Family Medicine

## 2016-05-13 VITALS — BP 112/75 | HR 83 | Temp 97.9°F | Resp 20 | Wt 192.8 lb

## 2016-05-13 DIAGNOSIS — R350 Frequency of micturition: Secondary | ICD-10-CM

## 2016-05-13 MED ORDER — CEPHALEXIN 500 MG PO CAPS: 500.0000 mg | ORAL_CAPSULE | Freq: Three times a day (TID) | ORAL | 0 refills | Status: DC

## 2016-05-13 NOTE — Patient Instructions (Addendum)
I will send your urine off for culture and testing to make certain you are properly treated for your symptoms.  Make certain to drink plenty of water/cranberry juice.    Have a GREAT trip!!!

## 2016-05-13 NOTE — Progress Notes (Signed)
[name] , 1951-04-29, 65 y.o., female MRN: [id number] Patient Care Team    Relationship Specialty Notifications Start End  [name], DO PCP - General Family Medicine  01/04/16   [name], DPM Consulting Physician Podiatry  01/12/16     CC: UTI symptoms Subjective: Pt presents for an acute OV with complaints of UTI like symptoms of 5 days duration.  Associated symptoms include urinary frequency, bladder pressure, mild dysuria. She denies fever, chills or low back pain. She use to have frequent UTI, but not recently. Pt has a h/o urinary incontinence and cystocele. She denies h/o of kidney stones. She is eating and drinking well.   Allergies  Allergen Reactions  . Sulfa Antibiotics    Social History  Substance Use Topics  . Smoking status: Former Research scientist (life sciences)  . Smokeless tobacco: Never Used  . Alcohol use 1.2 oz/week    2 Cans of beer per week   Past Medical History:  Diagnosis Date  . Allergic rhinitis   . Back pain    Dr. Karin Lieu ([facility])  . Basal cell carcinoma   . Cystocele   . 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 Fibrocystic breast   . GERD (gastroesophageal reflux disease)   . History of echocardiogram 12/2012   Dr. Daiva Huge: EF55-60%, mild TR, trace pulmonic and MR, abnl left vent diastolic  . Hypertension   . Piriformis syndrome 05/2013  . Postmenopausal   . Psoriasis 2009  . Sciatica of left side 05/2013  . TIA (transient ischemic attack) 2000  . Urinary incontinence    Past Surgical History:  Procedure Laterality Date  . CHOLECYSTECTOMY  11/2001  . LUMBAR DISC SURGERY  04/2005   R L4-5  and L5-S1  . SKIN BIOPSY Left 1999   left hand bcc vs scc  . TONSILLECTOMY AND ADENOIDECTOMY     Family History  Problem Relation Age of Onset  . Heart disease Father 45    MI at 79; died  . Diabetes Father   . Rheum arthritis Mother   . Alcohol abuse Mother   . Dementia Maternal Grandmother       Medication List       Accurate as of 05/13/16  4:05 PM. Always use your most recent med list.          aspirin EC 325 MG tablet Take 325 mg by mouth daily.   losartan-hydrochlorothiazide 100-25 MG tablet Commonly known as:  HYZAAR Take 1 tablet by mouth daily.   Omega 3 1000 MG Caps Take by mouth.   Vitamin D3 5000 units Caps Take by mouth.       No results found for this or any previous visit (from the past 24 hour(s)). No results found.   ROS: Negative, with the exception of above mentioned in HPI   Objective:  BP 112/75 (BP Location: Right Arm, Patient Position: Sitting, Cuff Size: Large)   Pulse 83   Temp 97.9 F (36.6 C)   Resp 20   Wt 192 lb 12.8 oz (87.5 kg)   SpO2 96%   BMI 33.09 kg/m  Body mass index is 33.09 kg/m. Gen: Afebrile. No acute distress. Nontoxic in appearance, well developed, well nourished.  HENT: AT. [place]. MMM Eyes:Pupils Equal Round Reactive to light, Extraocular movements intact,  Conjunctiva without redness, discharge or icterus. CV: RRR  Abd: Soft. NTND. BS present. MSK: no cva tenderness.  Neuro: Normal gait. PERLA. EOMi.  Alert. Oriented x3  Assessment/Plan: [name] is a 65 y.o. female present for acute OV for  Keflex 500 mg TID.  Micro U/A and Urine culture, pt will be called after results and regimen altered if needed.  Pt will be called with results.  F/U PRN  electronically signed by:  Howard Pouch, DO  [place]

## 2016-05-14 LAB — URINALYSIS, ROUTINE W REFLEX MICROSCOPIC
Bilirubin Urine: NEGATIVE
Glucose, UA: NEGATIVE
Hgb urine dipstick: NEGATIVE
KETONES UR: NEGATIVE
NITRITE: POSITIVE — AB
PH: 6 (ref 5.0–8.0)
Protein, ur: NEGATIVE
SPECIFIC GRAVITY, URINE: 1.013 (ref 1.001–1.035)

## 2016-05-14 LAB — URINALYSIS, MICROSCOPIC ONLY
BACTERIA UA: NONE SEEN [HPF]
CASTS: NONE SEEN [LPF]
CRYSTALS: NONE SEEN [HPF]
RBC / HPF: NONE SEEN RBC/HPF (ref <=2)
Yeast: NONE SEEN [HPF]

## 2016-05-15 ENCOUNTER — Telehealth: Payer: Self-pay | Admitting: Family Medicine

## 2016-05-15 LAB — URINE CULTURE

## 2016-05-15 NOTE — Telephone Encounter (Signed)
Spoke with patient and reviewed results and instructions. Patient verbalized understanding.

## 2016-05-15 NOTE — Telephone Encounter (Signed)
Patient did have some mild bacteria overload of group B strep. Please have her continue the antibiotics prescribed until completion.

## 2016-06-26 ENCOUNTER — Ambulatory Visit: Payer: Medicare Other | Admitting: Family Medicine

## 2016-07-09 ENCOUNTER — Other Ambulatory Visit: Payer: Self-pay | Admitting: Family Medicine

## 2016-07-09 DIAGNOSIS — I10 Essential (primary) hypertension: Secondary | ICD-10-CM

## 2016-07-10 ENCOUNTER — Encounter: Payer: Self-pay | Admitting: *Deleted

## 2016-08-02 ENCOUNTER — Other Ambulatory Visit: Payer: Self-pay | Admitting: Family Medicine

## 2016-08-02 ENCOUNTER — Telehealth: Payer: Self-pay | Admitting: Family Medicine

## 2016-08-02 DIAGNOSIS — I10 Essential (primary) hypertension: Secondary | ICD-10-CM

## 2016-08-02 NOTE — Telephone Encounter (Signed)
Patient would like to get RF of hyzaar medication until she can make it into her appointment.  Her husband passed away and she has been having a very hard time getting into the office.  Please sent Rx to [facility] in chart.

## 2016-08-02 NOTE — Telephone Encounter (Signed)
Please disregard.  Patient actually scheduled 08/05/16 and will have enough pills to make it until that appointment.

## 2016-08-05 ENCOUNTER — Encounter: Payer: Self-pay | Admitting: Family Medicine

## 2016-08-05 ENCOUNTER — Ambulatory Visit (INDEPENDENT_AMBULATORY_CARE_PROVIDER_SITE_OTHER): Payer: Medicare Other | Admitting: Family Medicine

## 2016-08-05 ENCOUNTER — Ambulatory Visit: Payer: Medicare Other | Admitting: Family Medicine

## 2016-08-05 VITALS — BP 131/87 | HR 74 | Temp 97.9°F | Resp 20 | Ht 64.0 in | Wt 195.5 lb

## 2016-08-05 DIAGNOSIS — F4320 Adjustment disorder, unspecified: Secondary | ICD-10-CM | POA: Diagnosis not present

## 2016-08-05 DIAGNOSIS — I10 Essential (primary) hypertension: Secondary | ICD-10-CM | POA: Diagnosis not present

## 2016-08-05 DIAGNOSIS — F4321 Adjustment disorder with depressed mood: Secondary | ICD-10-CM

## 2016-08-05 MED ORDER — LOSARTAN POTASSIUM-HCTZ 100-25 MG PO TABS: 1.0000 | ORAL_TABLET | Freq: Every day | ORAL | 1 refills | Status: DC

## 2016-08-05 NOTE — Patient Instructions (Signed)
It was great to see you today. So sorry for your loss.  If you need anything please do not hesitate to ask.   Your BP looked good today. I called 6 months of refills. Follow up at that time.    Complicated Grieving Introduction Grief is a normal response to the death of someone close to you. Feelings of fear, anger, and guilt can affect almost everyone who loses a loved one. It is also common to have symptoms of depression while you are grieving. These include problems with sleep, loss of appetite, and lack of energy. They may last for weeks or months after a loss. Complicated grief is different from normal grief or depression. Normal grieving involves sadness and feelings of loss, but these feelings are not constant. Complicated grief is a constant and severe type of grief. It interferes with your ability to function normally. It may last for several months to a year or longer. Complicated grief may require treatment from a mental health care provider. What are the causes? It is not known why some people continue to struggle with grief and others do not. You may be at higher risk for complicated grief if:  The death of your loved one was sudden or unexpected.  The death of your loved one was due to a violent event.  Your loved one committed suicide.  Your loved one was a child or a young person.  You were very close to or dependent on the loved one.  You have a history of depression. What are the signs or symptoms? Signs and symptoms of complicated grief may include:  Feeling disbelief or numbness.  Being unable to enjoy good memories of your loved one.  Needing to avoid anything that reminds you of your loved one.  Being unable to stop thinking about the death.  Feeling intense anger or guilt.  Feeling alone and hopeless.  Feeling that your life is meaningless and empty.  Losing the desire to live. How is this diagnosed? Your health care provider may diagnose complicated  grief if:  You have constant symptoms of grief for 6-12 months or longer.  Your symptoms are interfering with your ability to live your life. Your health care provider may want you to see a mental health care provider. Many symptoms of depression are similar to the symptoms of complicated grief. It is important to be evaluated for complicated grief along with other mental health conditions. How is this treated? Talk therapy with a mental health provider is the most common treatment for complicated grief. During therapy, you will learn healthy ways to cope with the loss of your loved one. In some cases, your mental health care provider may also recommend antidepressant medicines. Follow these instructions at home:  Take care of yourself.  Eat regular meals and maintain a healthy diet. Eat plenty of fruits, vegetables, and whole grains.  Try to get some exercise each day.  Keep regular hours for sleep. Try to get at least 8 hours of sleep each night.  Do not use drugs or alcohol to ease your symptoms.  Take medicines only as directed by your health care provider.  Spend time with friends and loved ones.  Consider joining a grief (bereavement) support group to help you deal with your loss.  Keep all follow-up visits as directed by your health care provider. This is important. Contact a health care provider if:  Your symptoms keep you from functioning normally.  Your symptoms do not get better  with treatment. Get help right away if:  You have serious thoughts of hurting yourself or someone else.  You have suicidal feelings. This information is not intended to replace advice given to you by your health care provider. Make sure you discuss any questions you have with your health care provider. Document Released: 07/01/2005 Document Revised: 12/07/2015 Document Reviewed: 12/09/2013  2017 Elsevier

## 2016-08-05 NOTE — Progress Notes (Signed)
[name] , 06/23/1951, 66 y.o., female MRN: [id number] Patient Care Team    Relationship Specialty Notifications Start End  [name], DO PCP - General Family Medicine  01/04/16   [name], DPM Consulting Physician Podiatry  01/12/16     CC:  Subjective: Pt presents for routine OV follow up on hypertension.   Hypertension: Pt reports compliance with Hyzaar today. She denies chest pain, shortness of breath or LE edema. Her BP is well controlled. She had been exercising routinely before the recent loss of her husband.   Grief reaction: Pt has recently lost her husband to kidney cancer (10 days ago). She reports a great support system with her family. Today is the first the day she has left the house. She wants to go to the gym, but did not quite make it there today. She states she feels she is dealing with it all "ok". She becomes tearful sometimes, but overall feels she is "ok".    Allergies  Allergen Reactions  . Sulfa Antibiotics    Social History  Substance Use Topics  . Smoking status: Former Research scientist (life sciences)  . Smokeless tobacco: Never Used  . Alcohol use 1.2 oz/week    2 Cans of beer per week   Past Medical History:  Diagnosis Date  . Allergic rhinitis   . Back pain    Dr. Karin Lieu ([facility])  . Basal cell carcinoma   . Cystocele   . 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 Fibrocystic breast   . GERD (gastroesophageal reflux disease)   . History of echocardiogram 12/2012   Dr. Daiva Huge: EF55-60%, mild TR, trace pulmonic and MR, abnl left vent diastolic  . Hypertension   . Piriformis syndrome 05/2013  . Postmenopausal   . Psoriasis 2009  . Sciatica of left side 05/2013  . TIA (transient ischemic attack) 2000  . Urinary incontinence    Past Surgical History:  Procedure Laterality Date  . CHOLECYSTECTOMY  11/2001  . LUMBAR DISC SURGERY  04/2005   R L4-5  and L5-S1  . SKIN BIOPSY Left 1999   left  hand bcc vs scc  . TONSILLECTOMY AND ADENOIDECTOMY     Family History  Problem Relation Age of Onset  . Rheum arthritis Mother   . Alcohol abuse Mother   . Heart disease Father 24    MI at 21; died  . Diabetes Father   . Dementia Maternal Grandmother    Allergies as of 08/05/2016      Reactions   Sulfa Antibiotics       Medication List       Accurate as of 08/05/16  3:40 PM. Always use your most recent med list.          aspirin EC 325 MG tablet Take 325 mg by mouth daily.   losartan-hydrochlorothiazide 100-25 MG tablet Commonly known as:  HYZAAR TAKE ONE TABLET BY MOUTH ONCE DAILY   Omega 3 1000 MG Caps Take by mouth.   Vitamin D3 5000 units Caps Take by mouth.       No results found for this or any previous visit (from the past 24 hour(s)). No results found.   ROS: Negative, with the exception of above mentioned in HPI   Objective:  BP 131/87 (BP Location: Right Arm, Patient Position: Sitting, Cuff Size: Normal)   Pulse 74   Temp 97.9 F (36.6 C)   Resp 20   Ht  5\' 4"  (1.626 m)   Wt 195 lb 8 oz (88.7 kg)   SpO2 99%   BMI 33.56 kg/m  Body mass index is 33.56 kg/m. Gen: Afebrile. No acute distress. Nontoxic in appearance, well developed, well nourished.  HENT: AT. [place]. MMM, no oral lesions.  Eyes:Pupils Equal Round Reactive to light, Extraocular movements intact,  Conjunctiva without redness, discharge or icterus. CV: RRR, no edema Chest: CTAB, no wheeze or crackles. Good air movement, normal resp effort.  Abd: Soft. NTND. BS present Neuro: Normal gait. PERLA. EOMi. Alert. Oriented x3  Psych: tearful, otherwise Normal affect, dress and demeanor (grieving). Normal speech. Normal thought content and judgment.  Assessment/Plan: VIVAN ALIOTTA is a 66 y.o. female present for OV for  Essential hypertension, benign - continue hyzaar.  - low sodium diet. Exercise > 150 m a week.  - losartan-hydrochlorothiazide (HYZAAR) 100-25 MG tablet; Take 1 tablet by  mouth daily.  Dispense: 90 tablet; Refill: 1 - F/U 6 months   Grief reaction - Husband recently passed away of kidney cancer.  - overall she feels she is coping "ok" - pt declines assistance at this time and has good support system with family and friends.   electronically signed by:  Howard Pouch, DO  [place]

## 2016-08-16 DIAGNOSIS — B0052 Herpesviral keratitis: Secondary | ICD-10-CM | POA: Diagnosis not present

## 2016-08-20 DIAGNOSIS — B0052 Herpesviral keratitis: Secondary | ICD-10-CM | POA: Diagnosis not present

## 2016-09-05 ENCOUNTER — Encounter: Payer: Self-pay | Admitting: Family Medicine

## 2016-11-19 ENCOUNTER — Ambulatory Visit (INDEPENDENT_AMBULATORY_CARE_PROVIDER_SITE_OTHER): Payer: Medicare Other

## 2016-11-19 VITALS — BP 144/88 | HR 82 | Ht 64.0 in | Wt 194.8 lb

## 2016-11-19 DIAGNOSIS — Z0000 Encounter for general adult medical examination without abnormal findings: Secondary | ICD-10-CM

## 2016-11-19 NOTE — Progress Notes (Addendum)
Subjective:   [name] is a 66 y.o. Waters who presents for an Initial Medicare Annual Wellness Visit.  Review of Systems    No ROS.  Medicare Wellness Visit. Cardiac Risk Factors include: advanced age (>43mn, >>2women);hypertension;smoking/ tobacco exposure   Sleep patterns: Sleeps 8 hours, feels rested. Up to void x 1.  Home Safety/Smoke Alarms:  Smoke detectors and security in place.  Living environment; residence and Firearm Safety: Lives in basement of sons home, feels safe in home.  Seat Belt Safety/Bike Helmet: Wears seat belt.   Counseling:   Eye Exam-Last exam 08/2016, yearly by Dr. FLoma Bostonexam Waters. Every 6 months by [name]:   Pap-N/A      Mammo-Last > 5 years, Declines further testing.      Dexa scan-07/15/2006. Declines further testing.    CCS-Cologuard kit at home, not sure she will complete.       Objective:    Today's Vitals   11/19/16 1004  BP: (!) 144/88  Pulse: 82  SpO2: 97%  Weight: 194 lb 12.8 oz (88.4 kg)  Height: _0  (1.626 m)   Body mass index is 33.44 kg/m.   Current Medications (verified) Outpatient Encounter Prescriptions as of 11/19/2016  Medication Sig  . aspirin EC 325 MG tablet Take 325 mg by mouth daily.  . Cholecalciferol (VITAMIN D3) 5000 units CAPS Take by mouth.  . losartan-hydrochlorothiazide (HYZAAR) 100-25 MG tablet Take 1 tablet by mouth daily.  . Multiple Vitamins-Minerals (MULTIVITAMIN ADULT PO) Take 2 Doses by mouth.  . Omega 3 1000 MG CAPS Take by mouth.  .Marland KitchenVITAMIN E PO Take by mouth.   No facility-administered encounter medications on file as of 11/19/2016.     Allergies (verified) Sulfa antibiotics   History: Past Medical History:  Diagnosis Date  . Allergic rhinitis   . Back pain    Dr. LKarin Lieu([facility])  . Basal cell carcinoma   . Cystocele   . 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 Fibrocystic breast   .  GERD (gastroesophageal reflux disease)   . History of echocardiogram 12/2012   Dr. GDaiva Waters EF55-60%, mild TR, trace pulmonic and MR, abnl left vent diastolic  . Hypertension   . Piriformis syndrome 05/2013  . Postmenopausal   . Psoriasis 2009  . Sciatica of left side 05/2013  . TIA (transient ischemic attack) 2000  . Urinary incontinence    Past Surgical History:  Procedure Laterality Date  . CHOLECYSTECTOMY  11/2001  . LUMBAR DISC SURGERY  04/2005   R L4-5  and L5-S1  . SKIN BIOPSY Left 1999   left hand bcc vs scc  . TONSILLECTOMY AND ADENOIDECTOMY     Family History  Problem Relation Age of Onset  . Rheum arthritis Mother   . Alcohol abuse Mother   . Heart disease Father 551   MI at 549 died  . Diabetes Father   . Dementia Maternal Grandmother    Social History   Occupational History  . Not on file.   Social History Main Topics  . Smoking status: Current Every Day Smoker    Packs/day: 0.50    Types: Cigarettes  . Smokeless tobacco: Never Used  . Alcohol use No  . Drug use: No  . Sexual activity: No    Tobacco Counseling Ready to quit: No Counseling given: No   Activities of Daily Living In your present state of health,  do you have any difficulty performing the following activities: 11/19/2016 6/30/Waters  Hearing? N N  Vision? N N  Difficulty concentrating or making decisions? N N  Walking or climbing stairs? N N  Dressing or bathing? N N  Doing errands, shopping? N N  Preparing Food and eating ? N -  Using the Toilet? N -  In the past six months, have you accidently leaked urine? N -  Do you have problems with loss of bowel control? N -  Managing your Medications? N -  Managing your Finances? N -  Housekeeping or managing your Housekeeping? N -  Some recent data might be hidden    Immunizations and Health Maintenance Immunization History  Administered Date(s) Administered  . Tdap 12/21/2013  . Zoster 06/24/2013   There are no preventive care  reminders to display for this patient.  Patient Care Team: [name], DO as PCP - General (Family Medicine) [name], DPM as Consulting Physician (Podiatry)  Indicate any recent Medical Services you may have received from other than [facility] providers in the past year (date may be approximate).     Assessment:   This is a routine wellness examination for [name]. Physical assessment deferred to PCP.   Hearing/Vision screen Hearing Screening Comments: Able to hear conversational tones w/o difficulty. No issues reported.   Vision Screening Comments: Wears contacts.  Dietary issues and exercise activities discussed: Current Exercise Habits: Structured exercise class, Type of exercise: yoga;Other - see comments;strength training/weights (pilates, senior class), Time (Minutes): 45, Frequency (Times/Week): 4, Weekly Exercise (Minutes/Week): 180, Exercise limited by: None identified   Diet (meal preparation, eat out, water intake, caffeinated beverages, dairy products, fruits and vegetables): Drinks water, protein shakes, unsweet tea and coffee  Breakfast: protein shake Lunch: fast food, soup Dinner: soup, salad  Discussed heart healthy diet and staying active.  Goals    . Weight (lb) < 175 lb (79.4 kg)          Lose weight by eating healthier options and increasing activity.       Depression Screen PHQ 2/9 Scores 11/19/2016 6/29/Waters  PHQ - 2 Score 0 0    Patient recently lost husband, emotional most days. States she does not feel she is depressed, just sad.   Fall Risk Fall Risk  11/19/2016 6/29/Waters  Falls in the past year? No No    Cognitive Function:       Ad8 score reviewed for issues:  Issues making decisions: no  Less interest in hobbies / activities: no  Repeats questions, stories (family complaining): no  Trouble using ordinary gadgets (microwave, computer, phone): no  Forgets the month or year: no  Mismanaging finances: no  Remembering appts:  no  Daily problems with thinking and/or memory: no Ad8 score is=0     Screening Tests Health Maintenance  Topic Date Due  . PNA vac Low Risk Adult (1 of 2 - PCV13) 12/13/2016 (Originally 07/10/2014)  . MAMMOGRAM  07/15/2018 (Originally 07/15/2008)  . COLONOSCOPY  07/15/2018 (Originally 07/11/1999)  . INFLUENZA VACCINE  02/12/2017  . TETANUS/TDAP  12/22/2023  . DEXA SCAN  Completed  . Hepatitis C Screening  Completed   Declines screening tests (mammo, dexa and colonoscopy) Declines vaccines.     Plan:    Make appointment with Urology.   Continue doing brain stimulating activities (puzzles, reading, adult coloring books, staying active) to keep memory sharp.   Bring a copy of your advance directives to your next office visit.  I have personally reviewed and noted the following in the patient's chart:   . Medical and social history . Use of alcohol, tobacco or illicit drugs  . Current medications and supplements . Functional ability and status . Nutritional status . Physical activity . Advanced directives . List of other physicians . Hospitalizations, surgeries, and ER visits in previous 12 months . Vitals . Screenings to include cognitive, depression, and falls . Referrals and appointments  In addition, I have reviewed and discussed with patient certain preventive protocols, quality metrics, and best practice recommendations. A written personalized care plan for preventive services as well as general preventive health recommendations were provided to patient.     Gerilyn Nestle, RN   11/19/2016    ___________________________________________________________ Juluis Rainier: -Declines screening tests and vaccines         -Plans to make appt with Urology for bladder leakage         -Considering medication therapy through grieving process (husband recently                     passed). Will discuss at next OV--01/08/2017  Medical screening examination/treatment/procedure(s)  were performed by non-physician practitioner and as supervising physician I was immediately available for consultation/collaboration.  I agree with above assessment and plan.  Electronically Signed by: Howard Pouch, DO [facility]

## 2016-11-19 NOTE — Patient Instructions (Addendum)
Make appointment with Urology.   Continue doing brain stimulating activities (puzzles, reading, adult coloring books, staying active) to keep memory sharp.   Bring a copy of your advance directives to your next office visit.   Fall Prevention in the Home Falls can cause injuries. They can happen to people of all ages. There are many things you can do to make your home safe and to help prevent falls. What can I do on the outside of my home?  Regularly fix the edges of walkways and driveways and fix any cracks.  Remove anything that might make you trip as you walk through a door, such as a raised step or threshold.  Trim any bushes or trees on the path to your home.  Use bright outdoor lighting.  Clear any walking paths of anything that might make someone trip, such as rocks or tools.  Regularly check to see if handrails are loose or broken. Make sure that both sides of any steps have handrails.  Any raised decks and porches should have guardrails on the edges.  Have any leaves, snow, or ice cleared regularly.  Use sand or salt on walking paths during winter.  Clean up any spills in your garage right away. This includes oil or grease spills. What can I do in the bathroom?  Use night lights.  Install grab bars by the toilet and in the tub and shower. Do not use towel bars as grab bars.  Use non-skid mats or decals in the tub or shower.  If you need to sit down in the shower, use a plastic, non-slip stool.  Keep the floor dry. Clean up any water that spills on the floor as soon as it happens.  Remove soap buildup in the tub or shower regularly.  Attach bath mats securely with double-sided non-slip rug tape.  Do not have throw rugs and other things on the floor that can make you trip. What can I do in the bedroom?  Use night lights.  Make sure that you have a light by your bed that is easy to reach.  Do not use any sheets or blankets that are too big for your bed. They  should not hang down onto the floor.  Have a firm chair that has side arms. You can use this for support while you get dressed.  Do not have throw rugs and other things on the floor that can make you trip. What can I do in the kitchen?  Clean up any spills right away.  Avoid walking on wet floors.  Keep items that you use a lot in easy-to-reach places.  If you need to reach something above you, use a strong step stool that has a grab bar.  Keep electrical cords out of the way.  Do not use floor polish or wax that makes floors slippery. If you must use wax, use non-skid floor wax.  Do not have throw rugs and other things on the floor that can make you trip. What can I do with my stairs?  Do not leave any items on the stairs.  Make sure that there are handrails on both sides of the stairs and use them. Fix handrails that are broken or loose. Make sure that handrails are as long as the stairways.  Check any carpeting to make sure that it is firmly attached to the stairs. Fix any carpet that is loose or worn.  Avoid having throw rugs at the top or bottom of the stairs.  If you do have throw rugs, attach them to the floor with carpet tape.  Make sure that you have a light switch at the top of the stairs and the bottom of the stairs. If you do not have them, ask someone to add them for you. What else can I do to help prevent falls?  Otero shoes that:  Do not have high heels.  Have rubber bottoms.  Are comfortable and fit you well.  Are closed at the toe. Do not Antwi sandals.  If you use a stepladder:  Make sure that it is fully opened. Do not climb a closed stepladder.  Make sure that both sides of the stepladder are locked into place.  Ask someone to hold it for you, if possible.  Clearly mark and make sure that you can see:  Any grab bars or handrails.  First and last steps.  Where the edge of each step is.  Use tools that help you move around (mobility aids) if  they are needed. These include:  Canes.  Walkers.  Scooters.  Crutches.  Turn on the lights when you go into a dark area. Replace any light bulbs as soon as they burn out.  Set up your furniture so you have a clear path. Avoid moving your furniture around.  If any of your floors are uneven, fix them.  If there are any pets around you, be aware of where they are.  Review your medicines with your doctor. Some medicines can make you feel dizzy. This can increase your chance of falling. Ask your doctor what other things that you can do to help prevent falls. This information is not intended to replace advice given to you by your health care provider. Make sure you discuss any questions you have with your health care provider. Document Released: 04/27/2009 Document Revised: 12/07/2015 Document Reviewed: 08/05/2014 Elsevier Interactive Patient Education  2017 [place] Maintenance, Female Adopting a healthy lifestyle and getting preventive care can go a long way to promote health and wellness. Talk with your health care provider about what schedule of regular examinations is right for you. This is a good chance for you to check in with your provider about disease prevention and staying healthy. In between checkups, there are plenty of things you can do on your own. Experts have done a lot of research about which lifestyle changes and preventive measures are most likely to keep you healthy. Ask your health care provider for more information. Weight and diet Eat a healthy diet  Be sure to include plenty of vegetables, fruits, low-fat dairy products, and lean protein.  Do not eat a lot of foods high in solid fats, added sugars, or salt.  Get regular exercise. This is one of the most important things you can do for your health.  Most adults should exercise for at least 150 minutes each week. The exercise should increase your heart rate and make you sweat (moderate-intensity  exercise).  Most adults should also do strengthening exercises at least twice a week. This is in addition to the moderate-intensity exercise. Maintain a healthy weight  Body mass index (BMI) is a measurement that can be used to identify possible weight problems. It estimates body fat based on height and weight. Your health care provider can help determine your BMI and help you achieve or maintain a healthy weight.  For females 43 years of age and older:  A BMI below 18.5 is considered underweight.  A BMI of  18.5 to 24.9 is normal.  A BMI of 25 to 29.9 is considered overweight.  A BMI of 30 and above is considered obese. Watch levels of cholesterol and blood lipids  You should start having your blood tested for lipids and cholesterol at 68 years of age, then have this test every 5 years.  You may need to have your cholesterol levels checked more often if:  Your lipid or cholesterol levels are high.  You are older than 68 years of age.  You are at high risk for heart disease. Cancer screening Lung Cancer  Lung cancer screening is recommended for adults 21-88 years old who are at high risk for lung cancer because of a history of smoking.  A yearly low-dose CT scan of the lungs is recommended for people who:  Currently smoke.  Have quit within the past 15 years.  Have at least a 30-pack-year history of smoking. A pack year is smoking an average of one pack of cigarettes a day for 1 year.  Yearly screening should continue until it has been 15 years since you quit.  Yearly screening should stop if you develop a health problem that would prevent you from having lung cancer treatment. Breast Cancer  Practice breast self-awareness. This means understanding how your breasts normally appear and feel.  It also means doing regular breast self-exams. Let your health care provider know about any changes, no matter how small.  If you are in your 20s or 30s, you should have a clinical  breast exam (CBE) by a health care provider every 1-3 years as part of a regular health exam.  If you are 64 or older, have a CBE every year. Also consider having a breast X-ray (mammogram) every year.  If you have a family history of breast cancer, talk to your health care provider about genetic screening.  If you are at high risk for breast cancer, talk to your health care provider about having an MRI and a mammogram every year.  Breast cancer gene (BRCA) assessment is recommended for women who have family members with BRCA-related cancers. BRCA-related cancers include:  Breast.  Ovarian.  Tubal.  Peritoneal cancers.  Results of the assessment will determine the need for genetic counseling and BRCA1 and BRCA2 testing. Cervical Cancer  Your health care provider may recommend that you be screened regularly for cancer of the pelvic organs (ovaries, uterus, and vagina). This screening involves a pelvic examination, including checking for microscopic changes to the surface of your cervix (Pap test). You may be encouraged to have this screening done every 3 years, beginning at age 31.  For women ages 66-65, health care providers may recommend pelvic exams and Pap testing every 3 years, or they may recommend the Pap and pelvic exam, combined with testing for human papilloma virus (HPV), every 5 years. Some types of HPV increase your risk of cervical cancer. Testing for HPV may also be done on women of any age with unclear Pap test results.  Other health care providers may not recommend any screening for nonpregnant women who are considered low risk for pelvic cancer and who do not have symptoms. Ask your health care provider if a screening pelvic exam is right for you.  If you have had past treatment for cervical cancer or a condition that could lead to cancer, you need Pap tests and screening for cancer for at least 20 years after your treatment. If Pap tests have been discontinued, your risk  factors (such  as having a new sexual partner) need to be reassessed to determine if screening should resume. Some women have medical problems that increase the chance of getting cervical cancer. In these cases, your health care provider may recommend more frequent screening and Pap tests. Colorectal Cancer  This type of cancer can be detected and often prevented.  Routine colorectal cancer screening usually begins at 69 years of age and continues through 68 years of age.  Your health care provider may recommend screening at an earlier age if you have risk factors for colon cancer.  Your health care provider may also recommend using home test kits to check for hidden blood in the stool.  A small camera at the end of a tube can be used to examine your colon directly (sigmoidoscopy or colonoscopy). This is done to check for the earliest forms of colorectal cancer.  Routine screening usually begins at age 20.  Direct examination of the colon should be repeated every 5-10 years through 68 years of age. However, you may need to be screened more often if early forms of precancerous polyps or small growths are found. Skin Cancer  Check your skin from head to toe regularly.  Tell your health care provider about any new moles or changes in moles, especially if there is a change in a mole's shape or color.  Also tell your health care provider if you have a mole that is larger than the size of a pencil eraser.  Always use sunscreen. Apply sunscreen liberally and repeatedly throughout the day.  Protect yourself by wearing long sleeves, pants, a wide-brimmed hat, and sunglasses whenever you are outside. Heart disease, diabetes, and high blood pressure  High blood pressure causes heart disease and increases the risk of stroke. High blood pressure is more likely to develop in:  People who have blood pressure in the high end of the normal range (130-139/85-89 mm Hg).  People who are overweight or  obese.  People who are African American.  If you are 66-35 years of age, have your blood pressure checked every 3-5 years. If you are 28 years of age or older, have your blood pressure checked every year. You should have your blood pressure measured twice-once when you are at a hospital or clinic, and once when you are not at a hospital or clinic. Record the average of the two measurements. To check your blood pressure when you are not at a hospital or clinic, you can use:  An automated blood pressure machine at a pharmacy.  A home blood pressure monitor.  If you are between 41 years and 98 years old, ask your health care provider if you should take aspirin to prevent strokes.  Have regular diabetes screenings. This involves taking a blood sample to check your fasting blood sugar level.  If you are at a normal weight and have a low risk for diabetes, have this test once every three years after 68 years of age.  If you are overweight and have a high risk for diabetes, consider being tested at a younger age or more often. Preventing infection Hepatitis B  If you have a higher risk for hepatitis B, you should be screened for this virus. You are considered at high risk for hepatitis B if:  You were born in a country where hepatitis B is common. Ask your health care provider which countries are considered high risk.  Your parents were born in a high-risk country, and you have not been immunized  against hepatitis B (hepatitis B vaccine).  You have HIV or AIDS.  You use needles to inject street drugs.  You live with someone who has hepatitis B.  You have had sex with someone who has hepatitis B.  You get hemodialysis treatment.  You take certain medicines for conditions, including cancer, organ transplantation, and autoimmune conditions. Hepatitis C  Blood testing is recommended for:  Everyone born from [birth year] through 1967.  Anyone with known risk factors for hepatitis C. Sexually  transmitted infections (STIs)  You should be screened for sexually transmitted infections (STIs) including gonorrhea and chlamydia if:  You are sexually active and are younger than 68 years of age.  You are older than 68 years of age and your health care provider tells you that you are at risk for this type of infection.  Your sexual activity has changed since you were last screened and you are at an increased risk for chlamydia or gonorrhea. Ask your health care provider if you are at risk.  If you do not have HIV, but are at risk, it may be recommended that you take a prescription medicine daily to prevent HIV infection. This is called pre-exposure prophylaxis (PrEP). You are considered at risk if:  You are sexually active and do not regularly use condoms or know the HIV status of your partner(s).  You take drugs by injection.  You are sexually active with a partner who has HIV. Talk with your health care provider about whether you are at high risk of being infected with HIV. If you choose to begin PrEP, you should first be tested for HIV. You should then be tested every 3 months for as long as you are taking PrEP. Pregnancy  If you are premenopausal and you may become pregnant, ask your health care provider about preconception counseling.  If you may become pregnant, take 400 to 800 micrograms (mcg) of folic acid every day.  If you want to prevent pregnancy, talk to your health care provider about birth control (contraception). Osteoporosis and menopause  Osteoporosis is a disease in which the bones lose minerals and strength with aging. This can result in serious bone fractures. Your risk for osteoporosis can be identified using a bone density scan.  If you are 60 years of age or older, or if you are at risk for osteoporosis and fractures, ask your health care provider if you should be screened.  Ask your health care provider whether you should take a calcium or vitamin D  supplement to lower your risk for osteoporosis.  Menopause may have certain physical symptoms and risks.  Hormone replacement therapy may reduce some of these symptoms and risks. Talk to your health care provider about whether hormone replacement therapy is right for you. Follow these instructions at home:  Schedule regular health, dental, and eye exams.  Stay current with your immunizations.  Do not use any tobacco products including cigarettes, chewing tobacco, or electronic cigarettes.  If you are pregnant, do not drink alcohol.  If you are breastfeeding, limit how much and how often you drink alcohol.  Limit alcohol intake to no more than 1 drink per day for nonpregnant women. One drink equals 12 ounces of beer, 5 ounces of wine, or 1 ounces of hard liquor.  Do not use street drugs.  Do not share needles.  Ask your health care provider for help if you need support or information about quitting drugs.  Tell your health care provider if you often feel  depressed.  Tell your health care provider if you have ever been abused or do not feel safe at home. This information is not intended to replace advice given to you by your health care provider. Make sure you discuss any questions you have with your health care provider. Document Released: 01/14/2011 Document Revised: 12/07/2015 Document Reviewed: 04/04/2015 Elsevier Interactive Patient Education  2017 [employer].

## 2016-11-22 ENCOUNTER — Ambulatory Visit: Payer: Medicare Other | Admitting: Family Medicine

## 2017-01-08 ENCOUNTER — Telehealth: Payer: Self-pay | Admitting: Family Medicine

## 2017-01-08 ENCOUNTER — Ambulatory Visit (INDEPENDENT_AMBULATORY_CARE_PROVIDER_SITE_OTHER): Payer: Medicare Other | Admitting: Family Medicine

## 2017-01-08 ENCOUNTER — Encounter: Payer: Self-pay | Admitting: Family Medicine

## 2017-01-08 VITALS — BP 140/86 | HR 81 | Temp 97.9°F | Resp 16 | Wt 193.0 lb

## 2017-01-08 DIAGNOSIS — F4381 Prolonged grief disorder: Secondary | ICD-10-CM

## 2017-01-08 DIAGNOSIS — F4321 Adjustment disorder with depressed mood: Secondary | ICD-10-CM | POA: Diagnosis not present

## 2017-01-08 DIAGNOSIS — I10 Essential (primary) hypertension: Secondary | ICD-10-CM

## 2017-01-08 DIAGNOSIS — F17200 Nicotine dependence, unspecified, uncomplicated: Secondary | ICD-10-CM

## 2017-01-08 DIAGNOSIS — F17210 Nicotine dependence, cigarettes, uncomplicated: Secondary | ICD-10-CM

## 2017-01-08 DIAGNOSIS — F4329 Adjustment disorder with other symptoms: Secondary | ICD-10-CM | POA: Insufficient documentation

## 2017-01-08 DIAGNOSIS — F321 Major depressive disorder, single episode, moderate: Secondary | ICD-10-CM

## 2017-01-08 DIAGNOSIS — Z720 Tobacco use: Secondary | ICD-10-CM | POA: Diagnosis not present

## 2017-01-08 DIAGNOSIS — Z716 Tobacco abuse counseling: Secondary | ICD-10-CM | POA: Diagnosis not present

## 2017-01-08 MED ORDER — BUPROPION HCL ER (SR) 150 MG PO TB12: 150.0000 mg | ORAL_TABLET | Freq: Two times a day (BID) | ORAL | 1 refills | Status: DC

## 2017-01-08 MED ORDER — LOSARTAN POTASSIUM-HCTZ 100-25 MG PO TABS: 1.0000 | ORAL_TABLET | Freq: Every day | ORAL | 1 refills | Status: DC

## 2017-01-08 NOTE — Progress Notes (Signed)
[name] , Jan 12, 1951, 66 y.o., female MRN: [id number] Patient Care Team    Relationship Specialty Notifications Start End  [name], DO PCP - General Family Medicine  01/04/16   [name], DPM Consulting Physician Podiatry  01/12/16     Chief Complaint  Patient presents with  . Hypertension    Follow up    Subjective: Pt presents for routine OV follow up on hypertension.   Hypertension: Pt reports compliance with Hyzaar QD. Blood pressures ranges at home not checked. Patient denies chest pain, shortness of breath or lower extremity edema. Pt takes daily ASA. Pt is not prescribed statin. BMP: 01/25/2016 GFR greater than 90, creatinine 0.62 CBC: 01/25/2016 unremarkable Lipid: 01/25/2016 total cholesterol 175, HDL 36, LDL 107, triglycerides 157 Diet: Has not been eating well secondary to grief Exercise: Has not been motivated to exercise RF: Hypertension, history of TIA, smoker, family history of heart disease  Grief reaction: Patient reports that she is still having a good deal of difficulty dealing with her grief surrounding the death of her husband proximally 6 months ago. She states that she is not motivated to leave the house. She does try to visit family at a town to keep her company. She reports when she is left alone she sleeps most of the day. She reports she cries every time she thinks of him. She does not want to attend any therapy rather in a group or 1:1, because "they just didn't know him. " She is able to talk to her family and her friends for support. Prior note 07-Sep-2016: Pt has recently lost her husband to kidney cancer (10 days ago). She reports a great support system with her family. Today is the first the day she has left the house. She wants to go to the gym, but did not quite make it there today. She states she feels she is dealing with it all "ok". She becomes tearful sometimes, but overall feels she is "ok".   Tobacco use disorder/smoking  cessation: Patient reports she has started back smoking since 09/07/22 after her husband's death. She states she hasn't smoked in over a decade, but felt she needed something to deal with her grief. She is smoking anywhere from a half a pack to 1 pack of cigarettes a day, light 100s. She states she really wants to quit smoking, and can't believe she started back. She had been successful with Chantix in the past.  Mood disorder screening: Negative completed today 01/08/2017  Depression screen [name] 2/9 01/08/2017 11/19/2016 01/11/2016  Decreased Interest 2 0 0  Down, Depressed, Hopeless 3 0 0  PHQ - 2 Score 5 0 0  Altered sleeping 0 - -  Tired, decreased energy 3 - -  Change in appetite 3 - -  Feeling bad or failure about yourself  1 - -  Trouble concentrating 3 - -  Moving slowly or fidgety/restless 0 - -  Suicidal thoughts 0 - -  PHQ-9 Score 15 - -   GAD 7 : Generalized Anxiety Score 01/08/2017  Nervous, Anxious, on Edge 2  Control/stop worrying 2  Worry too much - different things 0  Trouble relaxing 0  Restless 0  Easily annoyed or irritable 0  Afraid - awful might happen 0  Total GAD 7 Score 4  Anxiety Difficulty Very difficult    Allergies  Allergen Reactions  . Sulfa Antibiotics    Social History  Substance Use Topics  . Smoking status: Current  Every Day Smoker    Packs/day: 1.00    Types: Cigarettes  . Smokeless tobacco: Never Used  . Alcohol use No   Past Medical History:  Diagnosis Date  . Allergic rhinitis   . Back pain    Dr. Karin Lieu ([facility])  . Basal cell carcinoma   . Cystocele   . 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 Fibrocystic breast   . GERD (gastroesophageal reflux disease)   . History of echocardiogram 12/2012   Dr. Daiva Huge: EF55-60%, mild TR, trace pulmonic and MR, abnl left vent diastolic  . Hypertension   . Piriformis syndrome 05/2013  . Postmenopausal   . Psoriasis 2009  . Sciatica of  left side 05/2013  . TIA (transient ischemic attack) 2000  . Urinary incontinence    Past Surgical History:  Procedure Laterality Date  . CHOLECYSTECTOMY  11/2001  . LUMBAR DISC SURGERY  04/2005   R L4-5  and L5-S1  . SKIN BIOPSY Left 1999   left hand bcc vs scc  . TONSILLECTOMY AND ADENOIDECTOMY     Family History  Problem Relation Age of Onset  . Rheum arthritis Mother   . Alcohol abuse Mother   . Heart disease Father 48       MI at 47; died  . Diabetes Father   . Dementia Maternal Grandmother    Allergies as of 01/08/2017      Reactions   Sulfa Antibiotics       Medication List       Accurate as of 01/08/17  6:22 PM. Always use your most recent med list.          aspirin EC 325 MG tablet Take 325 mg by mouth daily.   buPROPion 150 MG 12 hr tablet Commonly known as:  WELLBUTRIN SR Take 1 tablet (150 mg total) by mouth 2 (two) times daily.   losartan-hydrochlorothiazide 100-25 MG tablet Commonly known as:  HYZAAR Take 1 tablet by mouth daily.   MULTIVITAMIN ADULT PO Take 2 Doses by mouth.   NON FORMULARY   NON FORMULARY   Omega 3 1000 MG Caps Take by mouth.   Vitamin D3 5000 units Caps Take by mouth.   VITAMIN E PO Take by mouth.       No results found for this or any previous visit (from the past 24 hour(s)). No results found.   ROS: Negative, with the exception of above mentioned in HPI  Objective:  BP 140/86 (BP Location: Left Arm, Patient Position: Sitting, Cuff Size: Large)   Pulse 81   Temp 97.9 F (36.6 C) (Oral)   Resp 16   Wt 193 lb (87.5 kg)   SpO2 97%   BMI 33.13 kg/m  Body mass index is 33.13 kg/m. Gen: Afebrile. No acute distress. Nontoxic in appearance, well-developed, well-nourished, obese Caucasian female. HENT: AT. [place]. MMM.  Eyes:Pupils Equal Round Reactive to light, Extraocular movements intact,  Conjunctiva without redness, discharge or icterus. Neck/lymp/endocrine: Supple, no lymphadenopathy, no thyromegaly CV:  RRR no murmur, trace edema, +2/4 P posterior tibialis pulses Chest: CTAB, no wheeze or crackles Skin: WWP. Intact. Neuro:  Normal gait. PERLA. EOMi. Alert. Oriented x3 Psych: Crying and tearful when discussing her husband, otherwise Normal affect, dress and demeanor. Normal speech. Normal thought content and judgment.  Assessment/Plan: INGE WALDROUP is a 66 y.o. female present for OV for  Essential hypertension, benign - Blood pressures are borderline today. Discussed with her that  has been her blood pressure the last few occasions she has been in a office. I would like her to try to decrease her sodium intake. She is to monitor her blood pressure in the home, and if routinely above 140/90 she is to record and bring with her on her next appointment in one month. - low sodium diet. Exercise > 150 m a week.  - Refills provided today on losartan-hydrochlorothiazide (HYZAAR) 100-25 MG tablet; Take 1 tablet by mouth daily.  Dispense: 90 tablet; Refill: 1 - We are starting a medication to help her with her depression/grief. Hopefully once starting this medication her blood pressure was decreased to normal parameters and she will be able to be more motivated to get out and exercise. - Follow-up in one month  Grief reaction - new - PHQ9 screening tool significant for Major depression. GAD, although only 4, she reports its is "very difficult" to cope with daily. Mood d/o screen negative.  - Discussed multiple different approaches to treatment including medications and therapy. Patient would rather not discuss her issues with strangers, therefore she does not wish any therapy. She is agreeable to medication start today. Scuffed a few different options of medications with her today and decided to start bupropion 150 mg for 3 days, and then taper to twice a day. The thought being that she will also get benefit from the medication for smoking cessation.  Tobacco use disorder/smoking cessation  counseling: Patient was asked about smoking history. They were advised  to quit smoking in a clear, strong and personalized manner. Their willingness to quit smoking was assessed and felt to be in  Action phase. They were offered assistance in cessation and options were explained to them today.  Patient decided to to try bupropion 150 mg twice a day. She was able to quit on Chantix many years ago, so she feels she will be able to be successful. Patient was advised to start medication, quit smoking/throw out all cigarettes approximately 7-10 days after starting medication. Consider nicotine gum for cravings. 10 minutes were spent today in counseling.  Pt advised to follow up one month. AVS on steps to quit smoking tips provided.  Patient to follow-up in one month, would like to collect CBC, BMP and fasting lipids at that appointment.   electronically signed by:  Howard Pouch, DO  [place]

## 2017-01-08 NOTE — Telephone Encounter (Signed)
Please call patient, she was seen yesterday and has a follow-up appointment scheduled in one month. Her fasting lab work will be due around that time. I have placed her orders in the computer if she would like to come in one to 2 days prior to her appointment to have labs completed. She may be unable since she is out of town frequently. - If not I would encourage her to be fasting for her next appointment scheduled with me and we'll collect it then.

## 2017-01-08 NOTE — Patient Instructions (Addendum)
Bupropion 1 tab a day for 3 days, then every 12 hours.  Follow up in 1 month on grief and mediation start.    Start medicine, then in 1 week stop smoking.     Steps to Quit Smoking Smoking tobacco can be bad for your health. It can also affect almost every organ in your body. Smoking puts you and people around you at risk for many serious long-lasting (chronic) diseases. Quitting smoking is hard, but it is one of the best things that you can do for your health. It is never too late to quit. What are the benefits of quitting smoking? When you quit smoking, you lower your risk for getting serious diseases and conditions. They can include:  Lung cancer or lung disease.  Heart disease.  Stroke.  Heart attack.  Not being able to have children (infertility).  Weak bones (osteoporosis) and broken bones (fractures).  If you have coughing, wheezing, and shortness of breath, those symptoms may get better when you quit. You may also get sick less often. If you are pregnant, quitting smoking can help to lower your chances of having a baby of low birth weight. What can I do to help me quit smoking? Talk with your doctor about what can help you quit smoking. Some things you can do (strategies) include:  Quitting smoking totally, instead of slowly cutting back how much you smoke over a period of time.  Going to in-person counseling. You are more likely to quit if you go to many counseling sessions.  Using resources and support systems, such as: ? Database administrator with a Social worker. ? Phone quitlines. ? Careers information officer. ? Support groups or group counseling. ? Text messaging programs. ? Mobile phone apps or applications.  Taking medicines. Some of these medicines may have nicotine in them. If you are pregnant or breastfeeding, do not take any medicines to quit smoking unless your doctor says it is okay. Talk with your doctor about counseling or other things that can help you.  Talk  with your doctor about using more than one strategy at the same time, such as taking medicines while you are also going to in-person counseling. This can help make quitting easier. What things can I do to make it easier to quit? Quitting smoking might feel very hard at first, but there is a lot that you can do to make it easier. Take these steps:  Talk to your family and friends. Ask them to support and encourage you.  Call phone quitlines, reach out to support groups, or work with a Social worker.  Ask people who smoke to not smoke around you.  Avoid places that make you want (trigger) to smoke, such as: ? Bars. ? Parties. ? Smoke-break areas at work.  Spend time with people who do not smoke.  Lower the stress in your life. Stress can make you want to smoke. Try these things to help your stress: ? Getting regular exercise. ? Deep-breathing exercises. ? Yoga. ? Meditating. ? Doing a body scan. To do this, close your eyes, focus on one area of your body at a time from head to toe, and notice which parts of your body are tense. Try to relax the muscles in those areas.  Download or buy apps on your mobile phone or tablet that can help you stick to your quit plan. There are many free apps, such as QuitGuide from the [employer] Office manager for Disease Control and Prevention). You can find more support from  smokefree.gov and other websites.  This information is not intended to replace advice given to you by your health care provider. Make sure you discuss any questions you have with your health care provider. Document Released: 04/27/2009 Document Revised: 02/27/2016 Document Reviewed: 11/15/2014 Elsevier Interactive Patient Education  2018 [employer].

## 2017-01-09 NOTE — Telephone Encounter (Signed)
Spoke with patient she states she will probably be out of town and unable to get labs drawn prior to her appt. She states she will call back to schedule lab appt if anything changes. patient aware to be fasting for labs.

## 2017-01-10 DIAGNOSIS — H25813 Combined forms of age-related cataract, bilateral: Secondary | ICD-10-CM | POA: Diagnosis not present

## 2017-01-21 DIAGNOSIS — Z720 Tobacco use: Secondary | ICD-10-CM | POA: Diagnosis not present

## 2017-01-21 DIAGNOSIS — R531 Weakness: Secondary | ICD-10-CM | POA: Diagnosis not present

## 2017-01-21 DIAGNOSIS — Z79899 Other long term (current) drug therapy: Secondary | ICD-10-CM | POA: Diagnosis not present

## 2017-01-21 DIAGNOSIS — Z882 Allergy status to sulfonamides status: Secondary | ICD-10-CM | POA: Diagnosis not present

## 2017-01-21 DIAGNOSIS — I10 Essential (primary) hypertension: Secondary | ICD-10-CM | POA: Diagnosis not present

## 2017-01-21 DIAGNOSIS — R404 Transient alteration of awareness: Secondary | ICD-10-CM | POA: Diagnosis not present

## 2017-01-21 DIAGNOSIS — F17210 Nicotine dependence, cigarettes, uncomplicated: Secondary | ICD-10-CM | POA: Diagnosis not present

## 2017-01-21 DIAGNOSIS — R42 Dizziness and giddiness: Secondary | ICD-10-CM | POA: Diagnosis not present

## 2017-01-21 DIAGNOSIS — R112 Nausea with vomiting, unspecified: Secondary | ICD-10-CM | POA: Diagnosis not present

## 2017-02-06 ENCOUNTER — Other Ambulatory Visit (INDEPENDENT_AMBULATORY_CARE_PROVIDER_SITE_OTHER): Payer: Medicare Other

## 2017-02-06 DIAGNOSIS — I10 Essential (primary) hypertension: Secondary | ICD-10-CM | POA: Diagnosis not present

## 2017-02-06 LAB — CBC WITH DIFFERENTIAL/PLATELET
Basophils Absolute: 0 10*3/uL (ref 0.0–0.1)
Basophils Relative: 0.3 % (ref 0.0–3.0)
EOS ABS: 0.1 10*3/uL (ref 0.0–0.7)
EOS PCT: 1.2 % (ref 0.0–5.0)
HEMATOCRIT: 44.5 % (ref 36.0–46.0)
HEMOGLOBIN: 15.4 g/dL — AB (ref 12.0–15.0)
LYMPHS PCT: 31 % (ref 12.0–46.0)
Lymphs Abs: 2.1 10*3/uL (ref 0.7–4.0)
MCHC: 34.7 g/dL (ref 30.0–36.0)
MCV: 95.4 fl (ref 78.0–100.0)
Monocytes Absolute: 0.6 10*3/uL (ref 0.1–1.0)
Monocytes Relative: 8.4 % (ref 3.0–12.0)
NEUTROS ABS: 4 10*3/uL (ref 1.4–7.7)
Neutrophils Relative %: 59.1 % (ref 43.0–77.0)
Platelets: 193 10*3/uL (ref 150.0–400.0)
RBC: 4.67 Mil/uL (ref 3.87–5.11)
RDW: 13.5 % (ref 11.5–15.5)
WBC: 6.8 10*3/uL (ref 4.0–10.5)

## 2017-02-06 LAB — LIPID PANEL
Cholesterol: 165 mg/dL (ref 0–200)
HDL: 33.4 mg/dL — ABNORMAL LOW (ref >39.00)
LDL CALC: 103 mg/dL — AB (ref 0–99)
NonHDL: 131.13
Total CHOL/HDL Ratio: 5
Triglycerides: 139 mg/dL (ref 0.0–149.0)
VLDL: 27.8 mg/dL (ref 0.0–40.0)

## 2017-02-07 ENCOUNTER — Ambulatory Visit (INDEPENDENT_AMBULATORY_CARE_PROVIDER_SITE_OTHER): Payer: Medicare Other | Admitting: Family Medicine

## 2017-02-07 ENCOUNTER — Encounter: Payer: Self-pay | Admitting: Family Medicine

## 2017-02-07 VITALS — BP 138/87 | HR 96 | Temp 98.5°F | Resp 20 | Ht 64.0 in | Wt 193.0 lb

## 2017-02-07 DIAGNOSIS — Z716 Tobacco abuse counseling: Secondary | ICD-10-CM

## 2017-02-07 DIAGNOSIS — Z720 Tobacco use: Secondary | ICD-10-CM | POA: Diagnosis not present

## 2017-02-07 DIAGNOSIS — F4329 Adjustment disorder with other symptoms: Secondary | ICD-10-CM

## 2017-02-07 DIAGNOSIS — I10 Essential (primary) hypertension: Secondary | ICD-10-CM | POA: Diagnosis not present

## 2017-02-07 DIAGNOSIS — F4321 Adjustment disorder with depressed mood: Secondary | ICD-10-CM | POA: Diagnosis not present

## 2017-02-07 DIAGNOSIS — F4381 Prolonged grief disorder: Secondary | ICD-10-CM

## 2017-02-07 LAB — COMPLETE METABOLIC PANEL WITH GFR
ALBUMIN: 4 g/dL (ref 3.6–5.1)
ALK PHOS: 88 U/L (ref 33–130)
ALT: 14 U/L (ref 6–29)
AST: 15 U/L (ref 10–35)
BILIRUBIN TOTAL: 0.9 mg/dL (ref 0.2–1.2)
BUN: 14 mg/dL (ref 7–25)
CALCIUM: 9.3 mg/dL (ref 8.6–10.4)
CO2: 23 mmol/L (ref 20–31)
CREATININE: 0.62 mg/dL (ref 0.50–0.99)
Chloride: 105 mmol/L (ref 98–110)
GFR, Est African American: >89 mL/min (ref >=60)
GFR, Est Non African American: >89 mL/min (ref >=60)
Glucose, Bld: 97 mg/dL (ref 65–99)
Potassium: 4.6 mmol/L (ref 3.5–5.3)
Sodium: 140 mmol/L (ref 135–146)
TOTAL PROTEIN: 6.4 g/dL (ref 6.1–8.1)

## 2017-02-07 MED ORDER — HYDROCHLOROTHIAZIDE 25 MG PO TABS: 25.0000 mg | ORAL_TABLET | Freq: Every day | ORAL | 1 refills | Status: DC

## 2017-02-07 NOTE — Progress Notes (Signed)
[name] , [birth year], 66 y.o., female MRN: [id number] Patient Care Team    Relationship Specialty Notifications Start End  [name], DO PCP - General Family Medicine  01/04/16   [name], DPM Consulting Physician Podiatry  01/12/16     Chief Complaint  Patient presents with  . grief  . Nicotine Dependence    smoking cessation    Subjective: Pt presents for routine OV follow up on hypertension.   Hypertension: Pt reports compliance with Hyzaar QD. Blood pressures ranges at home not checked. Patient denies chest pain, shortness of breath, dizziness or lower extremity edema. Pt takes daily ASA. Pt is not prescribed statin. BMP: 02/06/2017 GFR greater than 90, creatinine 0.62 CBC: 02/06/2017, normal with the exception of very mild elevated hemoglobin 15.4, in a smoker. Lipid: 01/17/2017 total cholesterol 165, HDL 33, LDL 103, triglycerides 139 Diet: Has not been eating well  Exercise: Has not been motivated to exercise RF: Hypertension, history of TIA, smoker, family history of heart disease  Grief reaction/tobacco abuse:  Patient was recently tried on Wellbutrin to help her with smoking cessation and her grief reaction secondary to her husband dying of kidney cancer in 24-Aug-2016. Patient did not tolerate Wellbutrin and felt extremely dizzy and nauseous. He was seen in emergency room and given Zofran and meclizine which resolved his symptoms. She did not restart Wellbutrin inferior that it was caused by that, and does not desire to restart Wellbutrin or any other medicine for her grief or tobacco cessation at this time. She states she would like to try to get over her grief on her own, she is feeling a little improved. She is traveling  more frequently and visiting her family.Patient reports she has started back smoking since 08/24/22 after her husband's death. She states she hasn't smoked in over a decade, but felt she needed something to deal with her grief. She is  smoking anywhere from a half a pack to 1 pack of cigarettes a day, light 100s.  She had been successful with Chantix in the past.  Mood disorder screening: Negative completed today 01/08/2017  Depression screen [facility] 2/9 01/08/2017 11/19/2016 01/11/2016  Decreased Interest 2 0 0  Down, Depressed, Hopeless 3 0 0  PHQ - 2 Score 5 0 0  Altered sleeping 0 - -  Tired, decreased energy 3 - -  Change in appetite 3 - -  Feeling bad or failure about yourself  1 - -  Trouble concentrating 3 - -  Moving slowly or fidgety/restless 0 - -  Suicidal thoughts 0 - -  PHQ-9 Score 15 - -   GAD 7 : Generalized Anxiety Score 01/08/2017  Nervous, Anxious, on Edge 2  Control/stop worrying 2  Worry too much - different things 0  Trouble relaxing 0  Restless 0  Easily annoyed or irritable 0  Afraid - awful might happen 0  Total GAD 7 Score 4  Anxiety Difficulty Very difficult    Allergies  Allergen Reactions  . Sulfa Antibiotics    Social History  Substance Use Topics  . Smoking status: Current Every Day Smoker    Packs/day: 1.00    Types: Cigarettes  . Smokeless tobacco: Never Used  . Alcohol use No   Past Medical History:  Diagnosis Date  . Allergic rhinitis   . Back pain    Dr. Karin Lieu ([facility])  . Basal cell carcinoma   . Cystocele   . 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 Fibrocystic breast   . GERD (gastroesophageal reflux disease)   . History of echocardiogram 12/2012   Dr. Daiva Huge: EF55-60%, mild TR, trace pulmonic and MR, abnl left vent diastolic  . Hypertension   . Piriformis syndrome 05/2013  . Postmenopausal   . Psoriasis 2009  . Sciatica of left side 05/2013  . TIA (transient ischemic attack) 2000  . Urinary incontinence    Past Surgical History:  Procedure Laterality Date  . CHOLECYSTECTOMY  11/2001  . LUMBAR DISC SURGERY  04/2005   R L4-5  and L5-S1  . SKIN BIOPSY Left 1999   left hand bcc vs scc  .  TONSILLECTOMY AND ADENOIDECTOMY     Family History  Problem Relation Age of Onset  . Rheum arthritis Mother   . Alcohol abuse Mother   . Heart disease Father 40       MI at 5; died  . Diabetes Father   . Dementia Maternal Grandmother    Allergies as of 02/07/2017      Reactions   Sulfa Antibiotics       Medication List       Accurate as of 02/07/17  1:50 PM. Always use your most recent med list.          aspirin EC 325 MG tablet Take 325 mg by mouth daily.   losartan-hydrochlorothiazide 100-25 MG tablet Commonly known as:  HYZAAR Take 1 tablet by mouth daily.   MULTIVITAMIN ADULT PO Take 2 Doses by mouth.   NON FORMULARY   NON FORMULARY   Omega 3 1000 MG Caps Take by mouth.   Vitamin D3 5000 units Caps Take by mouth.   VITAMIN E PO Take by mouth.       No results found for this or any previous visit (from the past 24 hour(s)). No results found.   ROS: Negative, with the exception of above mentioned in HPI  Objective:  BP 138/87 (BP Location: Right Arm, Patient Position: Sitting, Cuff Size: Large)   Pulse 96   Temp 98.5 F (36.9 C)   Resp 20   Ht 5\' 4"  (1.626 m)   Wt 193 lb (87.5 kg)   SpO2 95%   BMI 33.13 kg/m  Body mass index is 33.13 kg/m. Gen: Afebrile. No acute distress.  HENT: AT. [place].  MMM.  Eyes:Pupils Equal Round Reactive to light, Extraocular movements intact,  Conjunctiva without redness, discharge or icterus. CV: RRR No murmur, no edema, +2/4 P posterior tibialis pulses Chest: CTAB, no wheeze or crackles Abd: Soft.  NTND. BS present.  Neuro:  Normal gait. PERLA. EOMi. Alert. Oriented.  Psych: Normal affect, dress and demeanor. Normal speech. Normal thought content and judgment. Appears happy and well today.  Assessment/Plan: [name] is a 66 y.o. female present for OV for  Essential hypertension, benign - Blood pressures are Again borderline today. Discussed with her that has been her blood pressure the last few occasions  she has been in a office. I would like her to try to decrease her sodium intake. She is to monitor her blood pressure in the home, and if routinely above 140/90 she is to record and bring with her on her next appointment in one month. - low sodium diet. Exercise > 150 m a week.  - Continue losartan/HCTZ 100-25 milligrams daily, added HCTZ 25 mg.   Grief reaction - Would like to try to "deal" with it on her own. She will let  me know if she changes her mind and would like to restart a medication.  Tobacco use disorder/smoking cessation counseling: - she would like to wait and may consider chantix later.   Return in about 6 months (around 08/10/2017) for HTN'.  electronically signed by:  Howard Pouch, DO  [place]

## 2017-02-07 NOTE — Patient Instructions (Addendum)
It was great to see you today.  We are adding a little extra HCTZ to your BP regimen.  This should be just enough to bring BP to a good range.   Have fun in [place]!   Please help [place] help you:  We are honored you have chosen [facility] for your Primary Care home. Below you will find basic instructions that you may need to access in the future. Please help [place] help you by reading the instructions, which cover many of the frequent questions we experience.   Prescription refills and request:  -In order to allow more efficient response time, please call your pharmacy for all refills. They will forward the request electronically to [place]. This allows for the quickest possible response. Request left on a nurse line can take longer to refill, since these are checked as time allows between office patients and other phone calls.  - refill request can take up to 3-5 working days to complete.  - If request is sent electronically and request is appropiate, it is usually completed in 1-2 business days.  - all patients will need to be seen routinely for all chronic medical conditions requiring prescription medications (see follow-up below). If you are overdue for follow up on your condition, you will be asked to make an appointment and we will call in enough medication to cover you until your appointment (up to 30 days).  - all controlled substances will require a face to face visit to request/refill.  - if you desire your prescriptions to go through a new pharmacy, and have an active script at original pharmacy, you will need to call your pharmacy and have scripts transferred to new pharmacy. This is completed between the pharmacy locations and not by your provider.    Results: If any images or labs were ordered, it can take up to 1 week to get results depending on the test ordered and the lab/facility running and resulting the test. - Normal or stable results, which do not need further discussion, may be  released to your mychart immediately with attached note to you. A call may not be generated for normal results. Please make certain to sign up for mychart. If you have questions on how to activate your mychart you can call the front office.  - If your results need further discussion, our office will attempt to contact you via phone, and if unable to reach you after 2 attempts, we will release your abnormal result to your mychart with instructions.  - All results will be automatically released in mychart after 1 week.  - Your provider will provide you with explanation and instruction on all relevant material in your results. Please keep in mind, results and labs may appear confusing or abnormal to the untrained eye, but it does not mean they are actually abnormal for you personally. If you have any questions about your results that are not covered, or you desire more detailed explanation than what was provided, you should make an appointment with your provider to do so.   Our office handles many outgoing and incoming calls daily. If we have not contacted you within 1 week about your results, please check your mychart to see if there is a message first and if not, then contact our office.  In helping with this matter, you help decrease call volume, and therefore allow [place] to be able to respond to patients needs more efficiently.   Acute office visits (sick visit):  An  acute visit is intended for a new problem and are scheduled in shorter time slots to allow schedule openings for patients with new problems. This is the appropriate visit to discuss a new problem. In order to provide you with excellent quality medical care with proper time for you to explain your problem, have an exam and receive treatment with instructions, these appointments should be limited to one new problem per visit. If you experience a new problem, in which you desire to be addressed, please make an acute office visit, we save openings on  the schedule to accommodate you. Please do not save your new problem for any other type of visit, let us take care of it properly and quickly for you.   Follow up visits:  Depending on your condition(s) your provider will need to see you routinely in order to provide you with quality care and prescribe medication(s). Most chronic conditions (Example: hypertension, Diabetes, depression/anxiety... etc), require visits a couple times a year. Your provider will instruct you on proper follow up for your personal medical conditions and history. Please make certain to make follow up appointments for your condition as instructed. Failing to do so could result in lapse in your medication treatment/refills. If you request a refill, and are overdue to be seen on a condition, we will always provide you with a 30 day script (once) to allow you time to schedule.    Medicare wellness (well visit): - we have a wonderful Nurse Maudie Mercury), that will meet with you and provide you will yearly medicare wellness visits. These visits should occur yearly (can not be scheduled less than 1 calendar year apart) and cover preventive health, immunizations, advance directives and screenings you are entitled to yearly through your medicare benefits. Do not miss out on your entitled benefits, this is when medicare will pay for these benefits to be ordered for you.  These are strongly encouraged by your provider and is the appropriate type of visit to make certain you are up to date with all preventive health benefits. If you have not had your medicare wellness exam in the last 12 months, please make certain to schedule one by calling the office and schedule your medicare wellness with Maudie Mercury as soon as possible.   Yearly physical (well visit):  - Adults are recommended to be seen yearly for physicals. Check with your insurance and date of your last physical, most insurances require one calendar year between physicals. Physicals include all  preventive health topics, screenings, medical exam and labs that are appropriate for gender/age and history. You may have fasting labs needed at this visit. This is a well visit (not a sick visit), new problems should not be covered during this visit (see acute visit).  - Pediatric patients are seen more frequently when they are younger. Your provider will advise you on well child visit timing that is appropriate for your their age. - This is not a medicare wellness visit. Medicare wellness exams do not have an exam portion to the visit. Some medicare companies allow for a physical, some do not allow a yearly physical. If your medicare allows a yearly physical you can schedule the medicare wellness with our nurse Maudie Mercury and have your physical with your provider after, on the same day. Please check with insurance for your full benefits.   Late Policy/No Shows:  - all new patients should arrive 15-30 minutes earlier than appointment to allow [place] time  to  obtain all personal demographics,  insurance  information and for you to complete office paperwork. - All established patients should arrive 10-15 minutes earlier than appointment time to update all information and be checked in .  - In our best efforts to run on time, if you are late for your appointment you will be asked to either reschedule or if able, we will work you back into the schedule. There will be a wait time to work you back in the schedule,  depending on availability.  - If you are unable to make it to your appointment as scheduled, please call 24 hours ahead of time to allow [place] to fill the time slot with someone else who needs to be seen. If you do not cancel your appointment ahead of time, you may be charged a no show fee.

## 2017-07-17 ENCOUNTER — Other Ambulatory Visit: Payer: Self-pay | Admitting: Family Medicine

## 2017-08-05 ENCOUNTER — Ambulatory Visit: Payer: Self-pay

## 2017-08-05 NOTE — Telephone Encounter (Signed)
Pr calling with c/o "severe" sore throat, runny nose, diarrhea, body aches. Pt states that her throat is very painful and cannot swallow oral secretions at nighttime. Per protocol pt needs to see physician within 24 hours. Care advice given. Appt made for tomorrow at 2:15 with her PCP.   Reason for Disposition . [1] SEVERE sore throat AND [2] present > 24 hours  Answer Assessment - Initial Assessment Questions 1. ONSET: "When did the nasal discharge start?"      3 days ago 2. AMOUNT: "How much discharge is there?"      Moderate amount 3. COUGH: "Do you have a cough?" If yes, ask: "Describe the color of your sputum" (clear, white, yellow, green)     NP cough 4. RESPIRATORY DISTRESS: "Describe your breathing."      No SOB 5. FEVER: "Do you have a fever?" If so, ask: "What is your temperature, how was it measured, and when did it start?"     No fever 6. SEVERITY: "Overall, how bad are you feeling right now?" (e.g., doesn't interfere with normal activities, staying home from school/work, staying in bed)      Wrapping up in blanket and laying on sofa - no energy 7. OTHER SYMPTOMS: "Do you have any other symptoms?" (e.g., sore throat, earache, wheezing, vomiting)     "very sore throat", no earache, diarrhea, no vomiting, achyness, head congestion, fatigued 8. PREGNANCY: "Is there any chance you are pregnant?" "When was your last menstrual period?"     n/a  Protocols used: COMMON COLD-A-AH

## 2017-08-05 NOTE — Telephone Encounter (Addendum)
Noted patient has been scheduled .

## 2017-08-06 ENCOUNTER — Ambulatory Visit (INDEPENDENT_AMBULATORY_CARE_PROVIDER_SITE_OTHER): Payer: Medicare Other | Admitting: Family Medicine

## 2017-08-06 ENCOUNTER — Encounter: Payer: Self-pay | Admitting: Family Medicine

## 2017-08-06 VITALS — BP 128/76 | HR 74 | Temp 98.1°F | Resp 20 | Wt 184.5 lb

## 2017-08-06 DIAGNOSIS — J0100 Acute maxillary sinusitis, unspecified: Secondary | ICD-10-CM | POA: Diagnosis not present

## 2017-08-06 MED ORDER — DOXYCYCLINE HYCLATE 100 MG PO TABS: 100.0000 mg | ORAL_TABLET | Freq: Two times a day (BID) | ORAL | 0 refills | Status: DC

## 2017-08-06 NOTE — Patient Instructions (Signed)
Rest, hydrate.  + flonase, mucinex (DM if cough), nettie pot or nasal saline.  Doxycyline prescribed, take until completed.  If cough present it can last up to 6-8 weeks.  F/U 2 weeks of not improved.     Sinusitis, Adult Sinusitis is soreness and inflammation of your sinuses. Sinuses are hollow spaces in the bones around your face. They are located:  Around your eyes.  In the middle of your forehead.  Behind your nose.  In your cheekbones.  Your sinuses and nasal passages are lined with a stringy fluid (mucus). Mucus normally drains out of your sinuses. When your nasal tissues get inflamed or swollen, the mucus can get trapped or blocked so air cannot flow through your sinuses. This lets bacteria, viruses, and funguses grow, and that leads to infection. Follow these instructions at home: Medicines  Take, use, or apply over-the-counter and prescription medicines only as told by your doctor. These may include nasal sprays.  If you were prescribed an antibiotic medicine, take it as told by your doctor. Do not stop taking the antibiotic even if you start to feel better. Hydrate and Humidify  Drink enough water to keep your pee (urine) clear or pale yellow.  Use a cool mist humidifier to keep the humidity level in your home above 50%.  Breathe in steam for 10-15 minutes, 3-4 times a day or as told by your doctor. You can do this in the bathroom while a hot shower is running.  Try not to spend time in cool or dry air. Rest  Rest as much as possible.  Sleep with your head raised (elevated).  Make sure to get enough sleep each night. General instructions  Put a warm, moist washcloth on your face 3-4 times a day or as told by your doctor. This will help with discomfort.  Wash your hands often with soap and water. If there is no soap and water, use hand sanitizer.  Do not smoke. Avoid being around people who are smoking (secondhand smoke).  Keep all follow-up visits as told  by your doctor. This is important. Contact a doctor if:  You have a fever.  Your symptoms get worse.  Your symptoms do not get better within 10 days. Get help right away if:  You have a very bad headache.  You cannot stop throwing up (vomiting).  You have pain or swelling around your face or eyes.  You have trouble seeing.  You feel confused.  Your neck is stiff.  You have trouble breathing. This information is not intended to replace advice given to you by your health care provider. Make sure you discuss any questions you have with your health care provider. Document Released: 12/18/2007 Document Revised: 02/25/2016 Document Reviewed: 04/26/2015 Elsevier Interactive Patient Education  [employer].

## 2017-08-06 NOTE — Progress Notes (Signed)
[name] , [birth year], 67 y.o., female MRN: [id number] Patient Care Team    Relationship Specialty Notifications Start End  [name], DO PCP - General Family Medicine  01/04/16   [name], DPM Consulting Physician Podiatry  01/12/16     Chief Complaint  Patient presents with  . URI    cough,congestion, x 4 days     Subjective: Pt presents for an OV with complaints of cough of  Nasal congestion, rhinorrhea, sore throat 4 days duration.  Associated symptoms include sinus headache and severe pressure. She denies fever, chills, nausea, vomit or rash. Pt has tried mucinex DM to ease their symptoms.   Depression screen [facility] 2/9 01/08/2017 11/19/2016 01/11/2016  Decreased Interest 2 0 0  Down, Depressed, Hopeless 3 0 0  PHQ - 2 Score 5 0 0  Altered sleeping 0 - -  Tired, decreased energy 3 - -  Change in appetite 3 - -  Feeling bad or failure about yourself  1 - -  Trouble concentrating 3 - -  Moving slowly or fidgety/restless 0 - -  Suicidal thoughts 0 - -  PHQ-9 Score 15 - -    Allergies  Allergen Reactions  . Sulfa Antibiotics    Social History   Tobacco Use  . Smoking status: Current Every Day Smoker    Packs/day: 1.00    Types: Cigarettes  . Smokeless tobacco: Never Used  Substance Use Topics  . Alcohol use: No    Alcohol/week: 0.0 oz   Past Medical History:  Diagnosis Date  . Allergic rhinitis   . Back pain    Dr. Karin Lieu ([facility])  . Basal cell carcinoma   . Cystocele   . 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 Fibrocystic breast   . GERD (gastroesophageal reflux disease)   . History of echocardiogram 12/2012   Dr. Daiva Huge: EF55-60%, mild TR, trace pulmonic and MR, abnl left vent diastolic  . Hypertension   . Piriformis syndrome 05/2013  . Postmenopausal   . Psoriasis 2009  . Sciatica of left side 05/2013  . TIA (transient ischemic attack) 2000  . Urinary incontinence     Past Surgical History:  Procedure Laterality Date  . CHOLECYSTECTOMY  11/2001  . LUMBAR DISC SURGERY  04/2005   R L4-5  and L5-S1  . SKIN BIOPSY Left 1999   left hand bcc vs scc  . TONSILLECTOMY AND ADENOIDECTOMY     Family History  Problem Relation Age of Onset  . Rheum arthritis Mother   . Alcohol abuse Mother   . Heart disease Father 74       MI at 10; died  . Diabetes Father   . Dementia Maternal Grandmother    Allergies as of 08/06/2017      Reactions   Sulfa Antibiotics       Medication List        Accurate as of 08/06/17  2:05 PM. Always use your most recent med list.          aspirin EC 325 MG tablet Take 325 mg by mouth daily.   hydrochlorothiazide 25 MG tablet Commonly known as:  HYDRODIURIL TAKE 1 TABLET BY MOUTH ONCE DAILY   losartan-hydrochlorothiazide 100-25 MG tablet Commonly known as:  HYZAAR Take 1 tablet by mouth daily.   MULTIVITAMIN ADULT PO Take 2 Doses by mouth.   NON FORMULARY   NON FORMULARY   Omega 3 1000  MG Caps Take by mouth.   Vitamin D3 5000 units Caps Take by mouth.   VITAMIN E PO Take by mouth.       All past medical history, surgical history, allergies, family history, immunizations andmedications were updated in the EMR today and reviewed under the history and medication portions of their EMR.     ROS: Negative, with the exception of above mentioned in HPI   Objective:  BP 128/76 (BP Location: Right Arm, Patient Position: Sitting, Cuff Size: Normal)   Pulse 74   Temp 98.1 F (36.7 C)   Resp 20   Wt 184 lb 8 oz (83.7 kg)   SpO2 98%   BMI 31.67 kg/m  Body mass index is 31.67 kg/m. Gen: Afebrile. No acute distress. Nontoxic in appearance, well developed, well nourished.  HENT: AT. [place]. Bilateral TM visualized WNL. MMM, no oral lesions. Bilateral nares with erythema, swelling and drainage. Throat without erythema or exudates. PND, cough and sinus pressure present. Eyes:Pupils Equal Round Reactive to light,  Extraocular movements intact,  Conjunctiva without redness, discharge or icterus. Neck/lymp/endocrine: Supple,mild right ant cervical lymphadenopathy CV: RRR  Chest: CTAB, no wheeze or crackles. Good air movement, normal resp effort.  Neuro:  Normal gait. PERLA. EOMi. Alert. Oriented x3  No exam data present No results found. No results found for this or any previous visit (from the past 24 hour(s)).  Assessment/Plan: [name] is a 66 y.o. female present for OV for  Acute non-recurrent maxillary sinusitis Rest, hydrate.  + flonase, mucinex (DM if cough), nettie pot or nasal saline.  doxy prescribed, take until completed.  Offered prednisone and she declined  If cough present it can last up to 6-8 weeks.  F/U 2 weeks of not improved.     Reviewed expectations re: course of current medical issues.  Discussed self-management of symptoms.  Outlined signs and symptoms indicating need for more acute intervention.  Patient verbalized understanding and all questions were answered.  Patient received an After-Visit Summary.    No orders of the defined types were placed in this encounter.    Note is dictated utilizing voice recognition software. Although note has been proof read prior to signing, occasional typographical errors still can be missed. If any questions arise, please do not hesitate to call for verification.   electronically signed by:  Howard Pouch, DO  [place]

## 2017-08-11 ENCOUNTER — Encounter: Payer: Self-pay | Admitting: Family Medicine

## 2017-08-11 ENCOUNTER — Ambulatory Visit (INDEPENDENT_AMBULATORY_CARE_PROVIDER_SITE_OTHER): Payer: Medicare Other | Admitting: Family Medicine

## 2017-08-11 VITALS — BP 122/74 | HR 67 | Temp 97.8°F | Resp 20 | Ht 64.0 in | Wt 186.0 lb

## 2017-08-11 DIAGNOSIS — F17210 Nicotine dependence, cigarettes, uncomplicated: Secondary | ICD-10-CM

## 2017-08-11 DIAGNOSIS — Z716 Tobacco abuse counseling: Secondary | ICD-10-CM

## 2017-08-11 DIAGNOSIS — I10 Essential (primary) hypertension: Secondary | ICD-10-CM | POA: Diagnosis not present

## 2017-08-11 DIAGNOSIS — F17200 Nicotine dependence, unspecified, uncomplicated: Secondary | ICD-10-CM

## 2017-08-11 MED ORDER — VARENICLINE TARTRATE 0.5 MG PO TABS: ORAL_TABLET | ORAL | 0 refills | Status: DC

## 2017-08-11 MED ORDER — HYDROCHLOROTHIAZIDE 25 MG PO TABS: 25.0000 mg | ORAL_TABLET | Freq: Every day | ORAL | 1 refills | Status: DC

## 2017-08-11 MED ORDER — LOSARTAN POTASSIUM-HCTZ 100-25 MG PO TABS: 1.0000 | ORAL_TABLET | Freq: Every day | ORAL | 1 refills | Status: DC

## 2017-08-11 NOTE — Progress Notes (Signed)
[name] , 24-Jul-1950, 67 y.o., female MRN: [id number] Patient Care Team    Relationship Specialty Notifications Start End  [name], DO PCP - General Family Medicine  01/04/16   [name], DPM Consulting Physician Podiatry  01/12/16     Chief Complaint  Patient presents with  . Hypertension    Subjective: Pt presents for routine OV follow up on hypertension.   Hypertension: Pt reports compliance with Hyzaar QD and HCTZ 25 wd. Blood pressures ranges at home not checked. Patient denies chest pain, shortness of breath, dizziness or lower extremity edema.  Pt takes daily ASA. Pt is not prescribed statin. BMP: 02/06/2017 GFR greater than 90, creatinine 0.62 CBC: 02/06/2017, normal with the exception of very mild elevated hemoglobin 15.4, in a smoker. Lipid: 01/17/2017 total cholesterol 165, HDL 33, LDL 103, triglycerides 139 Diet:Pt  Has been eating well now.  Exercise: routine exercise.  RF: Hypertension, history of TIA, smoker, family history of heart disease  Smoking cessation:  And started back smoking 1 year ago at the time of her husband's death. She has tried Chantix in the past and was successful. She is smoking anywhere from a half a pack to 1 pack of cigarettes a day apply 100s. She is now ready to pick her quit date sometime next month and stop smoking. She would like to try Chantix again. Mood disorder screening: Negative completed today 01/08/2017  Depression screen [facility] 2/9 01/08/2017 11/19/2016 01/11/2016  Decreased Interest 2 0 0  Down, Depressed, Hopeless 3 0 0  PHQ - 2 Score 5 0 0  Altered sleeping 0 - -  Tired, decreased energy 3 - -  Change in appetite 3 - -  Feeling bad or failure about yourself  1 - -  Trouble concentrating 3 - -  Moving slowly or fidgety/restless 0 - -  Suicidal thoughts 0 - -  PHQ-9 Score 15 - -   GAD 7 : Generalized Anxiety Score 01/08/2017  Nervous, Anxious, on Edge 2  Control/stop worrying 2  Worry too much -  different things 0  Trouble relaxing 0  Restless 0  Easily annoyed or irritable 0  Afraid - awful might happen 0  Total GAD 7 Score 4  Anxiety Difficulty Very difficult    Allergies  Allergen Reactions  . Sulfa Antibiotics    Social History   Tobacco Use  . Smoking status: Current Every Day Smoker    Packs/day: 1.00    Types: Cigarettes  . Smokeless tobacco: Never Used  Substance Use Topics  . Alcohol use: No    Alcohol/week: 0.0 oz   Past Medical History:  Diagnosis Date  . Allergic rhinitis   . Back pain    Dr. Karin Lieu ([facility])  . Basal cell carcinoma   . Cystocele   . 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 Fibrocystic breast   . GERD (gastroesophageal reflux disease)   . History of echocardiogram 12/2012   Dr. Daiva Huge: EF55-60%, mild TR, trace pulmonic and MR, abnl left vent diastolic  . Hypertension   . Piriformis syndrome 05/2013  . Postmenopausal   . Psoriasis 2009  . Sciatica of left side 05/2013  . TIA (transient ischemic attack) 2000  . Urinary incontinence    Past Surgical History:  Procedure Laterality Date  . CHOLECYSTECTOMY  11/2001  . LUMBAR DISC SURGERY  04/2005   R L4-5  and L5-S1  . SKIN BIOPSY Left  1999   left hand bcc vs scc  . TONSILLECTOMY AND ADENOIDECTOMY     Family History  Problem Relation Age of Onset  . Rheum arthritis Mother   . Alcohol abuse Mother   . Heart disease Father 32       MI at 69; died  . Diabetes Father   . Dementia Maternal Grandmother    Allergies as of 08/11/2017      Reactions   Sulfa Antibiotics       Medication List        Accurate as of 08/11/17  9:53 AM. Always use your most recent med list.          aspirin EC 325 MG tablet Take 325 mg by mouth daily.   hydrochlorothiazide 25 MG tablet Commonly known as:  HYDRODIURIL Take 1 tablet (25 mg total) by mouth daily.   losartan-hydrochlorothiazide 100-25 MG tablet Commonly known as:   HYZAAR Take 1 tablet by mouth daily.   MULTIVITAMIN ADULT PO Take 2 Doses by mouth.   NON FORMULARY   NON FORMULARY   Omega 3 1000 MG Caps Take by mouth.   varenicline 0.5 MG tablet Commonly known as:  CHANTIX Day 1-3 0.5mg  QD, day 4-7 0.5mg  BID, then 1 mg BID   Vitamin D3 5000 units Caps Take by mouth.   VITAMIN E PO Take by mouth.       No results found for this or any previous visit (from the past 24 hour(s)). No results found.   ROS: Negative, with the exception of above mentioned in HPI  Objective:  BP 122/74 (BP Location: Right Arm, Patient Position: Sitting, Cuff Size: Large)   Pulse 67   Temp 97.8 F (36.6 C)   Resp 20   Ht 5\' 4"  (1.626 m)   Wt 186 lb (84.4 kg)   SpO2 97%   BMI 31.93 kg/m  Body mass index is 31.93 kg/m. Gen: Afebrile. No acute distress. Nontoxic in appearance. Well-developed. Well-nourished. Caucasian obese female. Pleasant. HENT: AT. [place]. MMM. Eyes:Pupils Equal Round Reactive to light, Extraocular movements intact,  Conjunctiva without redness, discharge or icterus. CV: RRR no murmur, no edema, +2/4 P posterior tibialis pulses Chest: CTAB, no wheeze or crackles Abd: Soft. NTND. BS present. No Masses palpated.  Neuro:  Normal gait. PERLA. EOMi. Alert. Oriented x3  Psych: Normal affect, dress and demeanor. Normal speech. Normal thought content and judgment..    Assessment/Plan: [name] is a 67 y.o. female present for OV for  Essential hypertension, benign/morbid obesity - stable today.  - Continue losartan/HCTZ 100-25 milligrams daily, added HCTZ 25 mg, refills provided on both today. - low sodium, exercise.  - Labs up-to-date. Repeat labs including lipids next visit. Patient aware to come fasting. - Follow-up 6 months  Tobacco use disorder/smoking cessation counseling: - Pt reports she would like a prescription for chantix. She is planning on picking her quit date for next month.  - Patient was asked about smoking  history. They were advised  to quit smoking in a clear, strong and personalized manner. Their willingness to quit smoking was assessed and felt to be in  Action phase. They were offered assistance in cessation and options were explained to them today.  Patient decided to retry Chantix 5 minutes minutes were spent today in counseling.  Pt advised to follow up 3 months after starting medication. She will call in one month and refills will be provided for her for 2 months.   Return in  about 6 months (around 02/08/2018) for HTN'.  electronically signed by:  Howard Pouch, DO  [place]

## 2017-08-11 NOTE — Patient Instructions (Addendum)
It was great to see you today. I am glad you are feeling better.  Refills provided. Followup in 6 months- Fasting labs will be collected.   Chantix taper prescribed. Pick your date and start 14 days prior. You will need to call in after 1 month for refill, follow up 3 months after starting.     Smoking Tobacco Information Smoking tobacco will very likely harm your health. Tobacco contains a poisonous (toxic), colorless chemical called nicotine. Nicotine affects the brain and makes tobacco addictive. This change in your brain can make it hard to stop smoking. Tobacco also has other toxic chemicals that can hurt your body and raise your risk of many cancers. How can smoking tobacco affect me? Smoking tobacco can increase your chances of having serious health conditions, such as:  Cancer. Smoking is most commonly associated with lung cancer, but can lead to cancer in other parts of the body.  Chronic obstructive pulmonary disease (COPD). This is a long-term lung condition that makes it hard to breathe. It also gets worse over time.  High blood pressure (hypertension), heart disease, stroke, or heart attack.  Lung infections, such as pneumonia.  Cataracts. This is when the lenses in the eyes become clouded.  Digestive problems. This may include peptic ulcers, heartburn, and gastroesophageal reflux disease (GERD).  Oral health problems, such as gum disease and tooth loss.  Loss of taste and smell.  Smoking can affect your appearance by causing:  Wrinkles.  Yellow or stained teeth, fingers, and fingernails.  Smoking tobacco can also affect your social life.  Many workplaces, [employer], hotels, and public places are tobacco-free. This means that you may experience challenges in finding places to smoke when away from home.  The cost of a smoking habit can be expensive. Expenses for someone who smokes come in two ways: ? You spend money on a regular basis to buy tobacco. ? Your  health care costs in the long-term are higher if you smoke.  Tobacco smoke can also affect the health of those around you. Children of smokers have greater chances of: ? Sudden infant death syndrome (SIDS). ? Ear infections. ? Lung infections.  What lifestyle changes can be made?  Do not start smoking. Quit if you already do.  To quit smoking: ? Make a plan to quit smoking and commit yourself to it. Look for programs to help you and ask your health care provider for recommendations and ideas. ? Talk with your health care provider about using nicotine replacement medicines to help you quit. Medicine replacement medicines include gum, lozenges, patches, sprays, or pills. ? Do not replace cigarette smoking with electronic cigarettes, which are commonly called e-cigarettes. The safety of e-cigarettes is not known, and some may contain harmful chemicals. ? Avoid places, people, or situations that tempt you to smoke. ? If you try to quit but return to smoking, don't give up hope. It is very common for people to try a number of times before they fully succeed. When you feel ready again, give it another try.  Quitting smoking might affect the way you eat as well as your weight. Be prepared to monitor your eating habits. Get support in planning and following a healthy diet.  Ask your health care provider about having regular tests (screenings) to check for cancer. This may include blood tests, imaging tests, and other tests.  Exercise regularly. Consider taking walks, joining a gym, or doing yoga or exercise classes.  Develop skills to manage your stress. These  skills include meditation. What are the benefits of quitting smoking? By quitting smoking, you may:  Lower your risk of getting cancer and other diseases caused by smoking.  Live longer.  Breathe better.  Lower your blood pressure and heart rate.  Stop your addiction to tobacco.  Stop creating secondhand smoke that hurts other  people.  Improve your sense of taste and smell.  Look better over time, due to having fewer wrinkles and less staining.  What can happen if changes are not made? If you do not stop smoking, you may:  Get cancer and other diseases.  Develop COPD or other long-term (chronic) lung conditions.  Develop serious problems with your heart and blood vessels (cardiovascular system).  Need more tests to screen for problems caused by smoking.  Have higher, long-term healthcare costs from medicines or treatments related to smoking.  Continue to have worsening changes in your lungs, mouth, and nose.  Where to find support: To get support to quit smoking, consider:  Asking your health care provider for more information and resources.  Taking classes to learn more about quitting smoking.  Looking for local organizations that offer resources about quitting smoking.  Joining a support group for people who want to quit smoking in your local community.  Where to find more information: You may find more information about quitting smoking from:  HelpGuide.org: www.helpguide.org/articles/addictions/how-to-quit-smoking.htm  https://hall.com/: smokefree.gov  American Lung Association: www.lung.org  Contact a health care provider if:  You have problems breathing.  Your lips, nose, or fingers turn blue.  You have chest pain.  You are coughing up blood.  You feel faint or you pass out.  You have other noticeable changes that cause you to worry. Summary  Smoking tobacco can negatively affect your health, the health of those around you, your finances, and your social life.  Do not start smoking. Quit if you already do. If you need help quitting, ask your health care provider.  Think about joining a support group for people who want to quit smoking in your local community. There are many effective programs that will help you to quit this behavior. This information is not intended to replace  advice given to you by your health care provider. Make sure you discuss any questions you have with your health care provider. Document Released: 07/16/2016 Document Revised: 07/16/2016 Document Reviewed: 07/16/2016 Elsevier Interactive Patient Education  [employer].

## 2017-09-26 ENCOUNTER — Ambulatory Visit (INDEPENDENT_AMBULATORY_CARE_PROVIDER_SITE_OTHER): Payer: Medicare Other | Admitting: Family Medicine

## 2017-09-26 ENCOUNTER — Encounter: Payer: Self-pay | Admitting: Family Medicine

## 2017-09-26 VITALS — BP 121/85 | HR 68 | Temp 97.9°F | Ht 64.0 in | Wt 184.8 lb

## 2017-09-26 DIAGNOSIS — J0100 Acute maxillary sinusitis, unspecified: Secondary | ICD-10-CM

## 2017-09-26 MED ORDER — HYDROCODONE-HOMATROPINE 5-1.5 MG/5ML PO SYRP: 5.0000 mL | ORAL_SOLUTION | Freq: Every day | ORAL | 0 refills | Status: DC

## 2017-09-26 MED ORDER — DOXYCYCLINE HYCLATE 100 MG PO TABS: 100.0000 mg | ORAL_TABLET | Freq: Two times a day (BID) | ORAL | 0 refills | Status: DC

## 2017-09-26 MED ORDER — BENZONATATE 100 MG PO CAPS: 100.0000 mg | ORAL_CAPSULE | Freq: Two times a day (BID) | ORAL | 0 refills | Status: DC | PRN

## 2017-09-26 NOTE — Progress Notes (Signed)
[name] , [birth year], 67 y.o., female MRN: [id number] Patient Care Team    Relationship Specialty Notifications Start End  [name], DO PCP - General Family Medicine  01/04/16   [name], DPM Consulting Physician Podiatry  01/12/16     Chief Complaint  Patient presents with  . URI    pt c/o cough, runny nose, congestion, denies fever, body ache, x 1week.      Subjective: Pt presents for an OV with complaints of cough of 1 week duration.  Associated symptoms include runny nose, congestion. Denies fever, body aches. Cough keeping her up at night. Her grandbaby had the flu. She felt she had a cold after but it resolved.  Pt has tried codeine cough syrup  to ease their symptoms.   Depression screen [facility] 2/9 01/08/2017 11/19/2016 01/11/2016  Decreased Interest 2 0 0  Down, Depressed, Hopeless 3 0 0  PHQ - 2 Score 5 0 0  Altered sleeping 0 - -  Tired, decreased energy 3 - -  Change in appetite 3 - -  Feeling bad or failure about yourself  1 - -  Trouble concentrating 3 - -  Moving slowly or fidgety/restless 0 - -  Suicidal thoughts 0 - -  PHQ-9 Score 15 - -    Allergies  Allergen Reactions  . Sulfa Antibiotics    Social History   Tobacco Use  . Smoking status: Current Every Day Smoker    Packs/day: 1.00    Types: Cigarettes  . Smokeless tobacco: Never Used  Substance Use Topics  . Alcohol use: No    Alcohol/week: 0.0 oz   Past Medical History:  Diagnosis Date  . Allergic rhinitis   . Back pain    Dr. Karin Lieu ([facility])  . Basal cell carcinoma   . Cystocele   . 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 Fibrocystic breast   . GERD (gastroesophageal reflux disease)   . History of echocardiogram 12/2012   Dr. Daiva Huge: EF55-60%, mild TR, trace pulmonic and MR, abnl left vent diastolic  . Hypertension   . Piriformis syndrome 05/2013  . Postmenopausal   . Psoriasis 2009  . Sciatica of left  side 05/2013  . TIA (transient ischemic attack) 2000  . Urinary incontinence    Past Surgical History:  Procedure Laterality Date  . CHOLECYSTECTOMY  11/2001  . LUMBAR DISC SURGERY  04/2005   R L4-5  and L5-S1  . SKIN BIOPSY Left 1999   left hand bcc vs scc  . TONSILLECTOMY AND ADENOIDECTOMY     Family History  Problem Relation Age of Onset  . Rheum arthritis Mother   . Alcohol abuse Mother   . Heart disease Father 66       MI at 49; died  . Diabetes Father   . Dementia Maternal Grandmother    Allergies as of 09/26/2017      Reactions   Sulfa Antibiotics       Medication List        Accurate as of 09/26/17  1:01 PM. Always use your most recent med list.          aspirin EC 325 MG tablet Take 325 mg by mouth daily.   hydrochlorothiazide 25 MG tablet Commonly known as:  HYDRODIURIL Take 1 tablet (25 mg total) by mouth daily.   losartan-hydrochlorothiazide 100-25 MG tablet Commonly known as:  HYZAAR Take 1 tablet by mouth daily.  MULTIVITAMIN ADULT PO Take 2 Doses by mouth.   NON FORMULARY   NON FORMULARY   Omega 3 1000 MG Caps Take by mouth.   varenicline 0.5 MG tablet Commonly known as:  CHANTIX Day 1-3 0.5mg  QD, day 4-7 0.5mg  BID, then 1 mg BID   Vitamin D3 5000 units Caps Take by mouth.   VITAMIN E PO Take by mouth.       All past medical history, surgical history, allergies, family history, immunizations andmedications were updated in the EMR today and reviewed under the history and medication portions of their EMR.     ROS: Negative, with the exception of above mentioned in HPI   Objective:  BP 121/85 (BP Location: Left Arm, Patient Position: Sitting, Cuff Size: Large)   Pulse 68   Temp 97.9 F (36.6 C) (Oral)   Ht 5\' 4"  (1.626 m)   Wt 184 lb 12.8 oz (83.8 kg)   SpO2 98%   BMI 31.72 kg/m  Body mass index is 31.72 kg/m. Gen: Afebrile. No acute distress. Nontoxic in appearance, well developed, well nourished.  HENT: AT. [place].  Bilateral TM visualized with fullness. MMM, no oral lesions. Bilateral nares with erythema and drainaeg. Throat without erythema or exudates. Cough, hoarseness, and TTP sinus.  Eyes:Pupils Equal Round Reactive to light, Extraocular movements intact,  Conjunctiva without redness, discharge or icterus. Neck/lymp/endocrine: Supple,no lymphadenopathy CV: RRR  Chest: CTAB, no wheeze or crackles. Good air movement, normal resp effort.  Abd: Soft. NTND. BS present.  Skin: no rashes, purpura or petechiae.  Neuro:  Normal gait. PERLA. EOMi. Alert. Oriented x3   No exam data present No results found. No results found for this or any previous visit (from the past 24 hour(s)).  Assessment/Plan: [name] is a 68 y.o. female present for OV for  Acute non-recurrent maxillary sinusitis Rest, hydrate.  + flonase, mucinex (DM if cough), nettie pot or nasal saline.  Doxy prescribed, take until completed.  Hycodan prescribed.  Tessalon perles.  If cough present it can last up to 6-8 weeks.  F/U 2 weeks of not improved.     Reviewed expectations re: course of current medical issues.  Discussed self-management of symptoms.  Outlined signs and symptoms indicating need for more acute intervention.  Patient verbalized understanding and all questions were answered.  Patient received an After-Visit Summary.    No orders of the defined types were placed in this encounter.    Note is dictated utilizing voice recognition software. Although note has been proof read prior to signing, occasional typographical errors still can be missed. If any questions arise, please do not hesitate to call for verification.   electronically signed by:  Howard Pouch, DO  [place]

## 2017-09-26 NOTE — Patient Instructions (Addendum)
Rest, hydrate.  +/- flonase, mucinex (DM if cough), nettie pot or nasal saline.  doxy prescribed, take until completed.  Tessalon perles prescribed for cough  And Mucinex DM during day for cough.  If cough present it can last up to 6-8 weeks.  F/U 2 weeks of not improved.  Cough syrup prescribed. This does have a controlled substance in it and can make you very tired. NO refills.     Sinusitis, Adult Sinusitis is soreness and inflammation of your sinuses. Sinuses are hollow spaces in the bones around your face. They are located:  Around your eyes.  In the middle of your forehead.  Behind your nose.  In your cheekbones.  Your sinuses and nasal passages are lined with a stringy fluid (mucus). Mucus normally drains out of your sinuses. When your nasal tissues get inflamed or swollen, the mucus can get trapped or blocked so air cannot flow through your sinuses. This lets bacteria, viruses, and funguses grow, and that leads to infection. Follow these instructions at home: Medicines  Take, use, or apply over-the-counter and prescription medicines only as told by your doctor. These may include nasal sprays.  If you were prescribed an antibiotic medicine, take it as told by your doctor. Do not stop taking the antibiotic even if you start to feel better. Hydrate and Humidify  Drink enough water to keep your pee (urine) clear or pale yellow.  Use a cool mist humidifier to keep the humidity level in your home above 50%.  Breathe in steam for 10-15 minutes, 3-4 times a day or as told by your doctor. You can do this in the bathroom while a hot shower is running.  Try not to spend time in cool or dry air. Rest  Rest as much as possible.  Sleep with your head raised (elevated).  Make sure to get enough sleep each night. General instructions  Put a warm, moist washcloth on your face 3-4 times a day or as told by your doctor. This will help with discomfort.  Wash your hands often with  soap and water. If there is no soap and water, use hand sanitizer.  Do not smoke. Avoid being around people who are smoking (secondhand smoke).  Keep all follow-up visits as told by your doctor. This is important. Contact a doctor if:  You have a fever.  Your symptoms get worse.  Your symptoms do not get better within 10 days. Get help right away if:  You have a very bad headache.  You cannot stop throwing up (vomiting).  You have pain or swelling around your face or eyes.  You have trouble seeing.  You feel confused.  Your neck is stiff.  You have trouble breathing. This information is not intended to replace advice given to you by your health care provider. Make sure you discuss any questions you have with your health care provider. Document Released: 12/18/2007 Document Revised: 02/25/2016 Document Reviewed: 04/26/2015 Elsevier Interactive Patient Education  [employer].

## 2017-11-07 ENCOUNTER — Telehealth: Payer: Self-pay | Admitting: *Deleted

## 2017-11-07 NOTE — Telephone Encounter (Signed)
Copied from [place] [id number]. Topic: Referral - Request >> Nov 07, 2017  9:05 AM Bea Graff, NT wrote: Reason for CRM: Pt needing a referral to [facility] for sciatic pain. Fax#: [phone]. Pt has an appt with them on 11/11/17 @ 4:00pm.  Spoke with patient scheduled an appt with Dr Raoul Pitch for evaluation.

## 2017-11-10 ENCOUNTER — Encounter: Payer: Self-pay | Admitting: Family Medicine

## 2017-11-10 ENCOUNTER — Ambulatory Visit (INDEPENDENT_AMBULATORY_CARE_PROVIDER_SITE_OTHER): Payer: Medicare Other | Admitting: Family Medicine

## 2017-11-10 VITALS — BP 128/80 | HR 76 | Temp 98.1°F | Ht 64.0 in | Wt 185.0 lb

## 2017-11-10 DIAGNOSIS — M5431 Sciatica, right side: Secondary | ICD-10-CM

## 2017-11-10 MED ORDER — MELOXICAM 15 MG PO TABS: 15.0000 mg | ORAL_TABLET | Freq: Every day | ORAL | 1 refills | Status: DC

## 2017-11-10 NOTE — Patient Instructions (Addendum)
Sciatica Sciatica is pain, numbness, weakness, or tingling along your sciatic nerve. The sciatic nerve starts in the lower back and goes down the back of each leg. Sciatica happens when this nerve is pinched or has pressure put on it. Sciatica usually goes away on its own or with treatment. Sometimes, sciatica may keep coming back (recur). Follow these instructions at home: Medicines  Take over-the-counter and prescription medicines only as told by your doctor.  Do not drive or use heavy machinery while taking prescription pain medicine. Managing pain  If directed, put ice on the affected area. ? Put ice in a plastic bag. ? Place a towel between your skin and the bag. ? Leave the ice on for 20 minutes, 2-3 times a day.  After icing, apply heat to the affected area before you exercise or as often as told by your doctor. Use the heat source that your doctor tells you to use, such as a moist heat pack or a heating pad. ? Place a towel between your skin and the heat source. ? Leave the heat on for 20-30 minutes. ? Remove the heat if your skin turns bright red. This is especially important if you are unable to feel pain, heat, or cold. You may have a greater risk of getting burned. Activity  Return to your normal activities as told by your doctor. Ask your doctor what activities are safe for you. ? Avoid activities that make your sciatica worse.  Take short rests during the day. Rest in a lying or standing position. This is usually better than sitting to rest. ? When you rest for a long time, do some physical activity or stretching between periods of rest. ? Avoid sitting for a long time without moving. Get up and move around at least one time each hour.  Exercise and stretch regularly, as told by your doctor.  Do not lift anything that is heavier than 10 lb (4.5 kg) while you have symptoms of sciatica. ? Avoid lifting heavy things even when you do not have symptoms. ? Avoid lifting heavy  things over and over.  When you lift objects, always lift in a way that is safe for your body. To do this, you should: ? Bend your knees. ? Keep the object close to your body. ? Avoid twisting. General instructions  Use good posture. ? Avoid leaning forward when you are sitting. ? Avoid hunching over when you are standing.  Stay at a healthy weight.  [place] comfortable shoes that support your feet. Avoid wearing high heels.  Avoid sleeping on a mattress that is too soft or too hard. You might have less pain if you sleep on a mattress that is firm enough to support your back.  Keep all follow-up visits as told by your doctor. This is important. Contact a doctor if:  You have pain that: ? Wakes you up when you are sleeping. ? Gets worse when you lie down. ? Is worse than the pain you have had in the past. ? Lasts longer than 4 weeks.  You lose weight for without trying. Get help right away if:  You cannot control when you pee (urinate) or poop (have a bowel movement).  You have weakness in any of these areas and it gets worse. ? Lower back. ? Lower belly (pelvis). ? Butt (buttocks). ? Legs.  You have redness or swelling of your back.  You have a burning feeling when you pee. This information is not intended to replace   advice given to you by your health care provider. Make sure you discuss any questions you have with your health care provider. Document Released: 04/09/2008 Document Revised: 12/07/2015 Document Reviewed: 03/10/2015 Elsevier Interactive Patient Education  2018 [place] not take additional advil or naproxen with mobic. Mobic is once a day with food.   Watch for easy bruising or bleeding with tumeric and mobic use.

## 2017-11-10 NOTE — Progress Notes (Signed)
[name] , Oct 09, 1950, 67 y.o., female MRN: [id number] Patient Care Team    Relationship Specialty Notifications Start End  [name], DO PCP - General Family Medicine  01/04/16   [name], DPM Consulting Physician Podiatry  01/12/16     Chief Complaint  Patient presents with  . Sciatica    Pt c/o sciatica pain said she need referral for physical therapy.     Subjective: Pt presents for an OV with complaints of right sciatica intermittently of a week duration.  Associated symptoms include increased pain that radiates from her thigh down to her medial ankle.  She states the discomfort is worse when sitting, standing or laying depending upon what she is doing.  She has had similar discomfort in the past and responded well to physical therapy.  He has had a discectomy or lumbar spine in the past.  She denies any recent trauma.  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 Depression screen [facility] 2/9 01/08/2017 11/19/2016 01/11/2016  Decreased Interest 2 0 0  Down, Depressed, Hopeless 3 0 0  PHQ - 2 Score 5 0 0  Altered sleeping 0 - -  Tired, decreased energy 3 - -  Change in appetite 3 - -  Feeling bad or failure about yourself  1 - -  Trouble concentrating 3 - -  Moving slowly or fidgety/restless 0 - -  Suicidal thoughts 0 - -  PHQ-9 Score 15 - -    Allergies  Allergen Reactions  . Sulfa Antibiotics    Social History   Tobacco Use  . Smoking status: Current Every Day Smoker    Packs/day: 1.00    Types: Cigarettes  . Smokeless tobacco: Never Used  Substance Use Topics  . Alcohol use: No    Alcohol/week: 0.0 oz   Past Medical History:  Diagnosis Date  . Allergic rhinitis   . Back pain    Dr. Karin Lieu ([facility])  . Basal cell carcinoma   . Cystocele   . 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 Fibrocystic  breast   . GERD (gastroesophageal reflux disease)   . History of echocardiogram 12/2012   Dr. Daiva Huge: EF55-60%, mild TR, trace pulmonic and MR, abnl left vent diastolic  . Hypertension   . Piriformis syndrome 05/2013  . Postmenopausal   . Psoriasis 2009  . Sciatica of left side 05/2013  . TIA (transient ischemic attack) 2000  . Urinary incontinence    Past Surgical History:  Procedure Laterality Date  . CHOLECYSTECTOMY  11/2001  . LUMBAR DISC SURGERY  04/2005   R L4-5  and L5-S1  . SKIN BIOPSY Left 1999   left hand bcc vs scc  . TONSILLECTOMY AND ADENOIDECTOMY     Family History  Problem Relation Age of Onset  . Rheum arthritis Mother   . Alcohol abuse Mother   . Heart disease Father 42       MI at 18; died  . Diabetes Father   . Dementia Maternal Grandmother    Allergies as of 11/10/2017      Reactions   Sulfa Antibiotics       Medication List        Accurate as of 11/10/17  8:54 AM. Always use your most recent med list.  aspirin EC 325 MG tablet Take 325 mg by mouth daily.   hydrochlorothiazide 25 MG tablet Commonly known as:  HYDRODIURIL Take 1 tablet (25 mg total) by mouth daily.   losartan-hydrochlorothiazide 100-25 MG tablet Commonly known as:  HYZAAR Take 1 tablet by mouth daily.   MULTIVITAMIN ADULT PO Take 2 Doses by mouth.   NON FORMULARY   NON FORMULARY   Omega 3 1000 MG Caps Take by mouth.   Vitamin D3 5000 units Caps Take by mouth.   VITAMIN E PO Take by mouth.       All past medical history, surgical history, allergies, family history, immunizations andmedications were updated in the EMR today and reviewed under the history and medication portions of their EMR.     ROS: Negative, with the exception of above mentioned in HPI   Objective:  BP 128/80 (BP Location: Left Arm, Patient Position: Sitting, Cuff Size: Large)   Pulse 76   Temp 98.1 F (36.7 C) (Oral)   Ht 5\' 4"  (1.626 m)   Wt 185 lb (83.9 kg)   SpO2 96%    BMI 31.76 kg/m  Body mass index is 31.76 kg/m. Gen: Afebrile. No acute distress. Nontoxic in appearance, well developed, well nourished.  HENT: AT. .MMM Eyes:Pupils Equal Round Reactive to light, Extraocular movements intact,  Conjunctiva without redness, discharge or icterus. MSK: No erythema, no soft tissue swelling.  No paraspinal fullness.  No bony tenderness.  Discomfort with right side bending.  Otherwise full range of motion of lumbar spine.  Negative straight leg raises bilaterally.  Positive FABRE right  for SI discomfort. Skin: no rashes, purpura or petechiae.  Neuro:  Normal gait. PERLA. EOMi. Alert. Oriented x3  Psych: Normal affect, dress and demeanor. Normal speech. Normal thought content and judgment.  No exam data present No results found. No results found for this or any previous visit (from the past 24 hour(s)).  Assessment/Plan: [name] is a 67 y.o. female present for OV for  Right sided sciatica - She wants to try PT. With radiation to her medial ankle, may need to refer or image if not responsive to PT, or if worsening with PT - Mobic trail. Cautioned on use w/ ASA and tumeric.  - Ambulatory referral to Physical Therapy - F/U 4 weeks PRN   Reviewed expectations re: course of current medical issues.  Discussed self-management of symptoms.  Outlined signs and symptoms indicating need for more acute intervention.  Patient verbalized understanding and all questions were answered.  Patient received an After-Visit Summary.    No orders of the defined types were placed in this encounter.    Note is dictated utilizing voice recognition software. Although note has been proof read prior to signing, occasional typographical errors still can be missed. If any questions arise, please do not hesitate to call for verification.   electronically signed by:  Howard Pouch, DO  [place]

## 2017-11-11 DIAGNOSIS — M25551 Pain in right hip: Secondary | ICD-10-CM | POA: Diagnosis not present

## 2017-11-11 DIAGNOSIS — M6281 Muscle weakness (generalized): Secondary | ICD-10-CM | POA: Diagnosis not present

## 2017-11-11 DIAGNOSIS — M545 Low back pain: Secondary | ICD-10-CM | POA: Diagnosis not present

## 2017-11-13 DIAGNOSIS — M25551 Pain in right hip: Secondary | ICD-10-CM | POA: Diagnosis not present

## 2017-11-13 DIAGNOSIS — M6281 Muscle weakness (generalized): Secondary | ICD-10-CM | POA: Diagnosis not present

## 2017-11-24 ENCOUNTER — Ambulatory Visit: Payer: Medicare Other

## 2017-11-24 NOTE — Progress Notes (Deleted)
Subjective:   [name] is a 67 y.o. female who presents for Medicare Annual (Subsequent) preventive examination.  Review of Systems:  No ROS.  Medicare Wellness Visit. Additional risk factors are reflected in the social history.    Sleep patterns:  Home Safety/Smoke Alarms: Feels safe in home. Smoke alarms in place.  Living environment; residence and Firearm Safety: Lives in basement of sons home Seat Belt Safety/Bike Helmet: Wears seat belt.   Female:   Pap-N/A      Mammo-Last > 5 years, Declines further testing.      Dexa scan-07/15/2006. Declines further testing.    CCS-Cologuard kit at home, not sure she will complete.       Objective:     Vitals: There were no vitals taken for this visit.  There is no height or weight on file to calculate BMI.  Advanced Directives 11/19/2016 01/12/2016  Does Patient Have a Medical Advance Directive? Yes;No Yes  Type of Advance Directive - [place];Living will  Does patient want to make changes to medical advance directive? Yes (MAU/Ambulatory/Procedural Areas - Information given) -  Copy of [place] in Chart? - No - copy requested    Tobacco Social History   Tobacco Use  Smoking Status Current Every Day Smoker  . Packs/day: 1.00  . Types: Cigarettes  Smokeless Tobacco Never Used     Ready to quit: Not Answered Counseling given: Not Answered   Past Medical History:  Diagnosis Date  . Allergic rhinitis   . Back pain    Dr. Karin Lieu ([facility])  . Basal cell carcinoma   . Cystocele   . 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 Fibrocystic breast   . GERD (gastroesophageal reflux disease)   . History of echocardiogram 12/2012   Dr. Daiva Huge: EF55-60%, mild TR, trace pulmonic and MR, abnl left vent diastolic  . Hypertension   . Piriformis syndrome 05/2013  . Postmenopausal   . Psoriasis 2009  . Sciatica of left side 05/2013    . TIA (transient ischemic attack) 2000  . Urinary incontinence    Past Surgical History:  Procedure Laterality Date  . CHOLECYSTECTOMY  11/2001  . LUMBAR DISC SURGERY  04/2005   R L4-5  and L5-S1  . SKIN BIOPSY Left 1999   left hand bcc vs scc  . TONSILLECTOMY AND ADENOIDECTOMY     Family History  Problem Relation Age of Onset  . Rheum arthritis Mother   . Alcohol abuse Mother   . Heart disease Father 77       MI at 49; died  . Diabetes Father   . Dementia Maternal Grandmother    Social History   Socioeconomic History  . Marital status: Widowed    Spouse name: Not on file  . Number of children: Not on file  . Years of education: Not on file  . Highest education level: Not on file  Occupational History  . Not on file  Social Needs  . Financial resource strain: Not on file  . Food insecurity:    Worry: Not on file    Inability: Not on file  . Transportation needs:    Medical: Not on file    Non-medical: Not on file  Tobacco Use  . Smoking status: Current Every Day Smoker    Packs/day: 1.00    Types: Cigarettes  . Smokeless tobacco: Never Used  Substance and Sexual Activity  .  Alcohol use: No    Alcohol/week: 0.0 oz  . Drug use: No  . Sexual activity: Never  Lifestyle  . Physical activity:    Days per week: Not on file    Minutes per session: Not on file  . Stress: Not on file  Relationships  . Social connections:    Talks on phone: Not on file    Gets together: Not on file    Attends religious service: Not on file    Active member of club or organization: Not on file    Attends meetings of clubs or organizations: Not on file    Relationship status: Not on file  Other Topics Concern  . Not on file  Social History Narrative   Widower 2018 (Damon). 2 sons.   College. Retired.    Former smoker (quit 04/2005- 30+ pack year), occasional etoh, no drugs   Drinks caffeine, uses herbal remedies, daily vitamin use   Wears her seatbelt, wears her bicycle helmet    Exercises routinely   Smoke detector in the home.    Firearms in the home (locked)   Feels safe in her relationships.     Outpatient Encounter Medications as of 11/24/2017  Medication Sig  . aspirin EC 325 MG tablet Take 325 mg by mouth daily.  . Cholecalciferol (VITAMIN D3) 5000 units CAPS Take by mouth.  . hydrochlorothiazide (HYDRODIURIL) 25 MG tablet Take 1 tablet (25 mg total) by mouth daily.  Marland Kitchen losartan-hydrochlorothiazide (HYZAAR) 100-25 MG tablet Take 1 tablet by mouth daily.  . meloxicam (MOBIC) 15 MG tablet Take 1 tablet (15 mg total) by mouth daily.  . Multiple Vitamins-Minerals (MULTIVITAMIN ADULT PO) Take 2 Doses by mouth.  . NON FORMULARY   . NON FORMULARY   . Omega 3 1000 MG CAPS Take by mouth.  Marland Kitchen VITAMIN E PO Take by mouth.   No facility-administered encounter medications on file as of 11/24/2017.     Activities of Daily Living No flowsheet data found.  Patient Care Team: [name], DO as PCP - General (Family Medicine) [name], DPM as Consulting Physician (Podiatry)    Assessment:   This is a routine wellness examination for [name].  Exercise Activities and Dietary recommendations   Diet (meal preparation, eat out, water intake, caffeinated beverages, dairy products, fruits and vegetables):   Breakfast: Lunch:  Dinner:      Goals    . Weight (lb) < 175 lb (79.4 kg)     Lose weight by eating healthier options and increasing activity.        Fall Risk Fall Risk  11/19/2016 01/11/2016  Falls in the past year? No No    Depression Screen PHQ 2/9 Scores 01/08/2017 11/19/2016 01/11/2016  PHQ - 2 Score 5 0 0  PHQ- 9 Score 15 - -     Cognitive Function        Immunization History  Administered Date(s) Administered  . Tdap 12/21/2013  . Zoster 06/24/2013    Screening Tests Health Maintenance  Topic Date Due  . PNA vac Low Risk Adult (1 of 2 - PCV13) 07/10/2014  . INFLUENZA VACCINE  04/08/2018 (Originally 02/12/2018)  .  MAMMOGRAM  07/15/2018 (Originally 07/15/2008)  . COLONOSCOPY  07/15/2018 (Originally 07/11/1999)  . TETANUS/TDAP  12/22/2023  . DEXA SCAN  Completed  . Hepatitis C Screening  Completed        Plan:     I have personally reviewed and noted the following in the patient's chart:   .  Medical and social history . Use of alcohol, tobacco or illicit drugs  . Current medications and supplements . Functional ability and status . Nutritional status . Physical activity . Advanced directives . List of other physicians . Hospitalizations, surgeries, and ER visits in previous 12 months . Vitals . Screenings to include cognitive, depression, and falls . Referrals and appointments  In addition, I have reviewed and discussed with patient certain preventive protocols, quality metrics, and best practice recommendations. A written personalized care plan for preventive services as well as general preventive health recommendations were provided to patient.     Gerilyn Nestle, RN  11/24/2017

## 2017-11-25 DIAGNOSIS — M6281 Muscle weakness (generalized): Secondary | ICD-10-CM | POA: Diagnosis not present

## 2017-11-25 DIAGNOSIS — M25551 Pain in right hip: Secondary | ICD-10-CM | POA: Diagnosis not present

## 2017-11-25 DIAGNOSIS — M545 Low back pain: Secondary | ICD-10-CM | POA: Diagnosis not present

## 2017-12-30 ENCOUNTER — Telehealth: Payer: Self-pay

## 2017-12-30 NOTE — Telephone Encounter (Signed)
Patient needs appt for refills called patient schedule an appointment.

## 2017-12-30 NOTE — Telephone Encounter (Signed)
Copied from [place] [id number]. Topic: General - Other >> Dec 30, 2017  3:50 PM Carolyn Stare wrote:  Pt call to say she had the following med filled and now she need to have the 2nd pack   [place]

## 2017-12-31 ENCOUNTER — Encounter: Payer: Self-pay | Admitting: Family Medicine

## 2017-12-31 ENCOUNTER — Ambulatory Visit (INDEPENDENT_AMBULATORY_CARE_PROVIDER_SITE_OTHER): Payer: Medicare Other | Admitting: Family Medicine

## 2017-12-31 VITALS — BP 132/82 | HR 84 | Temp 98.2°F | Resp 20 | Ht 64.0 in | Wt 189.0 lb

## 2017-12-31 DIAGNOSIS — Z716 Tobacco abuse counseling: Secondary | ICD-10-CM

## 2017-12-31 MED ORDER — VARENICLINE TARTRATE 1 MG PO TABS: 1.0000 mg | ORAL_TABLET | Freq: Two times a day (BID) | ORAL | 0 refills | Status: DC

## 2017-12-31 NOTE — Progress Notes (Signed)
[name] , 1951/05/01, 67 y.o., female MRN: [id number] Patient Care Team    Relationship Specialty Notifications Start End  [name], DO PCP - General Family Medicine  01/04/16   [name], DPM Consulting Physician Podiatry  01/12/16     Chief Complaint  Patient presents with  . Nicotine Dependence    refill of chantix     Subjective:  Smoking cessation:  She finally picked her quit date, she started chantix starter. She is down to smoking only 3-5 cigarettes a day. She wants to continue the chantix. She is tolerating medicine without side effects    Depression screen [facility] 2/9 12/31/2017 01/08/2017 11/19/2016 01/11/2016  Decreased Interest 0 2 0 0  Down, Depressed, Hopeless 0 3 0 0  PHQ - 2 Score 0 5 0 0  Altered sleeping - 0 - -  Tired, decreased energy - 3 - -  Change in appetite - 3 - -  Feeling bad or failure about yourself  - 1 - -  Trouble concentrating - 3 - -  Moving slowly or fidgety/restless - 0 - -  Suicidal thoughts - 0 - -  PHQ-9 Score - 15 - -    Allergies  Allergen Reactions  . Sulfa Antibiotics    Social History   Tobacco Use  . Smoking status: Current Every Day Smoker    Packs/day: 1.00    Types: Cigarettes  . Smokeless tobacco: Never Used  . Tobacco comment: down to 3-4 cigarettes daily  Substance Use Topics  . Alcohol use: No    Alcohol/week: 0.0 oz   Past Medical History:  Diagnosis Date  . Allergic rhinitis   . Back pain    Dr. Karin Lieu ([facility])  . Basal cell carcinoma   . Cystocele   . 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 Fibrocystic breast   . GERD (gastroesophageal reflux disease)   . History of echocardiogram 12/2012   Dr. Daiva Huge: EF55-60%, mild TR, trace pulmonic and MR, abnl left vent diastolic  . Hypertension   . Piriformis syndrome 05/2013  . Postmenopausal   . Psoriasis 2009  . Sciatica of left side 05/2013  . TIA (transient ischemic attack)  2000  . Urinary incontinence    Past Surgical History:  Procedure Laterality Date  . CHOLECYSTECTOMY  11/2001  . LUMBAR DISC SURGERY  04/2005   R L4-5  and L5-S1  . SKIN BIOPSY Left 1999   left hand bcc vs scc  . TONSILLECTOMY AND ADENOIDECTOMY     Family History  Problem Relation Age of Onset  . Rheum arthritis Mother   . Alcohol abuse Mother   . Heart disease Father 64       MI at 33; died  . Diabetes Father   . Dementia Maternal Grandmother    Allergies as of 12/31/2017      Reactions   Sulfa Antibiotics       Medication List        Accurate as of 12/31/17  3:22 PM. Always use your most recent med list.          aspirin EC 325 MG tablet Take 325 mg by mouth daily.   CHANTIX STARTING MONTH PAK 0.5 MG X 11 & 1 MG X 42 tablet Generic drug:  varenicline   hydrochlorothiazide 25 MG tablet Commonly known as:  HYDRODIURIL Take 1 tablet (25 mg total) by mouth daily.   losartan 100  MG tablet Commonly known as:  COZAAR Take 100 mg by mouth daily.   meloxicam 15 MG tablet Commonly known as:  MOBIC Take 1 tablet (15 mg total) by mouth daily.   MULTIVITAMIN ADULT PO Take 2 Doses by mouth.   NON FORMULARY   NON FORMULARY   Omega 3 1000 MG Caps Take by mouth.   Vitamin D3 5000 units Caps Take by mouth.   VITAMIN E PO Take by mouth.       All past medical history, surgical history, allergies, family history, immunizations andmedications were updated in the EMR today and reviewed under the history and medication portions of their EMR.     ROS: Negative, with the exception of above mentioned in HPI   Objective:  BP 132/82 (BP Location: Right Arm, Patient Position: Sitting, Cuff Size: Large)   Pulse 84   Temp 98.2 F (36.8 C)   Resp 20   Ht 5\' 4"  (1.626 m)   Wt 189 lb (85.7 kg)   SpO2 96%   BMI 32.44 kg/m  Body mass index is 32.44 kg/m. Gen: Afebrile. No acute distress. Nontoxic in appearance, well developed, well nourished.  HENT: AT.  [place].MMM Eyes:Pupils Equal Round Reactive to light, Extraocular movements intact,  Conjunctiva without redness, discharge or icterus. Neck/lymp/endocrine: Supple,no lymphadenopathy CV: RRR  Chest: CTAB, no wheeze or crackles.  Abd: Soft. NTND.Skin: no rashes, purpura or petechiae.  Neuro:  Normal gait. PERLA. EOMi. Alert. Oriented x3  Psych: Normal affect, dress and demeanor. Normal speech. Normal thought content and judgment.  No exam data present No results found. No results found for this or any previous visit (from the past 24 hour(s)).  Assessment/Plan: [name] is a 67 y.o. female present for OV for  Encounter for smoking cessation counseling Full course of chantix prescribed. She is doing well. Encouraged to stop smoking the remaining few cigs she is holding onto daily, and start nicotine gum for cravings.  - varenicline (CHANTIX) 1 MG tablet; Take 1 tablet (1 mg total) by mouth 2 (two) times daily.  Dispense: 180 tablet; Refill: 0 - f/u PRN  Reviewed expectations re: course of current medical issues.  Discussed self-management of symptoms.  Outlined signs and symptoms indicating need for more acute intervention.  Patient verbalized understanding and all questions were answered.  Patient received an After-Visit Summary.    No orders of the defined types were placed in this encounter.    Note is dictated utilizing voice recognition software. Although note has been proof read prior to signing, occasional typographical errors still can be missed. If any questions arise, please do not hesitate to call for verification.   electronically signed by:  Howard Pouch, DO  [place]

## 2017-12-31 NOTE — Patient Instructions (Signed)
Chantix 1 tab every 12 hours for 3 months.  I am very proud of you. This is a great start. Have fun on your trip. Pick up nicotine gum for "those casino" moments   Smoking Tobacco Information Smoking tobacco will very likely harm your health. Tobacco contains a poisonous (toxic), colorless chemical called nicotine. Nicotine affects the brain and makes tobacco addictive. This change in your brain can make it hard to stop smoking. Tobacco also has other toxic chemicals that can hurt your body and raise your risk of many cancers. How can smoking tobacco affect me? Smoking tobacco can increase your chances of having serious health conditions, such as:  Cancer. Smoking is most commonly associated with lung cancer, but can lead to cancer in other parts of the body.  Chronic obstructive pulmonary disease (COPD). This is a long-term lung condition that makes it hard to breathe. It also gets worse over time.  High blood pressure (hypertension), heart disease, stroke, or heart attack.  Lung infections, such as pneumonia.  Cataracts. This is when the lenses in the eyes become clouded.  Digestive problems. This may include peptic ulcers, heartburn, and gastroesophageal reflux disease (GERD).  Oral health problems, such as gum disease and tooth loss.  Loss of taste and smell.  Smoking can affect your appearance by causing:  Wrinkles.  Yellow or stained teeth, fingers, and fingernails.  Smoking tobacco can also affect your social life.  Many workplaces, [employer], hotels, and public places are tobacco-free. This means that you may experience challenges in finding places to smoke when away from home.  The cost of a smoking habit can be expensive. Expenses for someone who smokes come in two ways: ? You spend money on a regular basis to buy tobacco. ? Your health care costs in the long-term are higher if you smoke.  Tobacco smoke can also affect the health of those around you. Children of  smokers have greater chances of: ? Sudden infant death syndrome (SIDS). ? Ear infections. ? Lung infections.  What lifestyle changes can be made?  Do not start smoking. Quit if you already do.  To quit smoking: ? Make a plan to quit smoking and commit yourself to it. Look for programs to help you and ask your health care provider for recommendations and ideas. ? Talk with your health care provider about using nicotine replacement medicines to help you quit. Medicine replacement medicines include gum, lozenges, patches, sprays, or pills. ? Do not replace cigarette smoking with electronic cigarettes, which are commonly called e-cigarettes. The safety of e-cigarettes is not known, and some may contain harmful chemicals. ? Avoid places, people, or situations that tempt you to smoke. ? If you try to quit but return to smoking, don't give up hope. It is very common for people to try a number of times before they fully succeed. When you feel ready again, give it another try.  Quitting smoking might affect the way you eat as well as your weight. Be prepared to monitor your eating habits. Get support in planning and following a healthy diet.  Ask your health care provider about having regular tests (screenings) to check for cancer. This may include blood tests, imaging tests, and other tests.  Exercise regularly. Consider taking walks, joining a gym, or doing yoga or exercise classes.  Develop skills to manage your stress. These skills include meditation. What are the benefits of quitting smoking? By quitting smoking, you may:  Lower your risk of getting cancer and other  diseases caused by smoking.  Live longer.  Breathe better.  Lower your blood pressure and heart rate.  Stop your addiction to tobacco.  Stop creating secondhand smoke that hurts other people.  Improve your sense of taste and smell.  Look better over time, due to having fewer wrinkles and less staining.  What can  happen if changes are not made? If you do not stop smoking, you may:  Get cancer and other diseases.  Develop COPD or other long-term (chronic) lung conditions.  Develop serious problems with your heart and blood vessels (cardiovascular system).  Need more tests to screen for problems caused by smoking.  Have higher, long-term healthcare costs from medicines or treatments related to smoking.  Continue to have worsening changes in your lungs, mouth, and nose.  Where to find support: To get support to quit smoking, consider:  Asking your health care provider for more information and resources.  Taking classes to learn more about quitting smoking.  Looking for local organizations that offer resources about quitting smoking.  Joining a support group for people who want to quit smoking in your local community.  Where to find more information: You may find more information about quitting smoking from:  HelpGuide.org: www.helpguide.org/articles/addictions/how-to-quit-smoking.htm  https://hall.com/: smokefree.gov  American Lung Association: www.lung.org  Contact a health care provider if:  You have problems breathing.  Your lips, nose, or fingers turn blue.  You have chest pain.  You are coughing up blood.  You feel faint or you pass out.  You have other noticeable changes that cause you to worry. Summary  Smoking tobacco can negatively affect your health, the health of those around you, your finances, and your social life.  Do not start smoking. Quit if you already do. If you need help quitting, ask your health care provider.  Think about joining a support group for people who want to quit smoking in your local community. There are many effective programs that will help you to quit this behavior. This information is not intended to replace advice given to you by your health care provider. Make sure you discuss any questions you have with your health care  provider. Document Released: 07/16/2016 Document Revised: 07/16/2016 Document Reviewed: 07/16/2016 Elsevier Interactive Patient Education  [employer].

## 2018-01-14 ENCOUNTER — Encounter: Payer: Self-pay | Admitting: *Deleted

## 2018-02-09 ENCOUNTER — Ambulatory Visit (INDEPENDENT_AMBULATORY_CARE_PROVIDER_SITE_OTHER): Payer: Medicare Other | Admitting: Family Medicine

## 2018-02-09 ENCOUNTER — Encounter: Payer: Self-pay | Admitting: Family Medicine

## 2018-02-09 VITALS — BP 129/84 | HR 68 | Temp 98.1°F | Resp 20 | Ht 64.0 in | Wt 190.0 lb

## 2018-02-09 DIAGNOSIS — E782 Mixed hyperlipidemia: Secondary | ICD-10-CM

## 2018-02-09 DIAGNOSIS — Z716 Tobacco abuse counseling: Secondary | ICD-10-CM | POA: Diagnosis not present

## 2018-02-09 DIAGNOSIS — I10 Essential (primary) hypertension: Secondary | ICD-10-CM

## 2018-02-09 LAB — BASIC METABOLIC PANEL
BUN: 32 mg/dL — ABNORMAL HIGH (ref 6–23)
CALCIUM: 9.5 mg/dL (ref 8.4–10.5)
CO2: 29 mEq/L (ref 19–32)
Chloride: 103 mEq/L (ref 96–112)
Creatinine, Ser: 0.63 mg/dL (ref 0.40–1.20)
GFR: 99.71 mL/min (ref >60.00)
GLUCOSE: 96 mg/dL (ref 70–99)
Potassium: 4.2 mEq/L (ref 3.5–5.1)
Sodium: 141 mEq/L (ref 135–145)

## 2018-02-09 LAB — CBC
HCT: 41.2 % (ref 36.0–46.0)
Hemoglobin: 14.4 g/dL (ref 12.0–15.0)
MCHC: 35.1 g/dL (ref 30.0–36.0)
MCV: 93.6 fl (ref 78.0–100.0)
Platelets: 161 10*3/uL (ref 150.0–400.0)
RBC: 4.4 Mil/uL (ref 3.87–5.11)
RDW: 13 % (ref 11.5–15.5)
WBC: 6.6 10*3/uL (ref 4.0–10.5)

## 2018-02-09 LAB — TSH: TSH: 2.37 u[IU]/mL (ref 0.35–4.50)

## 2018-02-09 MED ORDER — HYDROCHLOROTHIAZIDE 50 MG PO TABS: 50.0000 mg | ORAL_TABLET | Freq: Every day | ORAL | 1 refills | Status: DC

## 2018-02-09 MED ORDER — LOSARTAN POTASSIUM 100 MG PO TABS: 100.0000 mg | ORAL_TABLET | Freq: Every day | ORAL | 1 refills | Status: DC

## 2018-02-09 NOTE — Patient Instructions (Signed)
It was great to see you today.  You look great.  I have refilled your meds today.  Keep up the good work with stopping smoking.  F/U 6 months.   Please help [place] help you:  We are honored you have chosen [facility] for your Primary Care home. Below you will find basic instructions that you may need to access in the future. Please help [place] help you by reading the instructions, which cover many of the frequent questions we experience.   Prescription refills and request:  -In order to allow more efficient response time, please call your pharmacy for all refills. They will forward the request electronically to [place]. This allows for the quickest possible response. Request left on a nurse line can take longer to refill, since these are checked as time allows between office patients and other phone calls.  - refill request can take up to 3-5 working days to complete.  - If request is sent electronically and request is appropiate, it is usually completed in 1-2 business days.  - all patients will need to be seen routinely for all chronic medical conditions requiring prescription medications (see follow-up below). If you are overdue for follow up on your condition, you will be asked to make an appointment and we will call in enough medication to cover you until your appointment (up to 30 days).  - all controlled substances will require a face to face visit to request/refill.  - if you desire your prescriptions to go through a new pharmacy, and have an active script at original pharmacy, you will need to call your pharmacy and have scripts transferred to new pharmacy. This is completed between the pharmacy locations and not by your provider.    Results: If any images or labs were ordered, it can take up to 1 week to get results depending on the test ordered and the lab/facility running and resulting the test. - Normal or stable results, which do not need further discussion, may be released to your  mychart immediately with attached note to you. A call may not be generated for normal results. Please make certain to sign up for mychart. If you have questions on how to activate your mychart you can call the front office.  - If your results need further discussion, our office will attempt to contact you via phone, and if unable to reach you after 2 attempts, we will release your abnormal result to your mychart with instructions.  - All results will be automatically released in mychart after 1 week.  - Your provider will provide you with explanation and instruction on all relevant material in your results. Please keep in mind, results and labs may appear confusing or abnormal to the untrained eye, but it does not mean they are actually abnormal for you personally. If you have any questions about your results that are not covered, or you desire more detailed explanation than what was provided, you should make an appointment with your provider to do so.   Our office handles many outgoing and incoming calls daily. If we have not contacted you within 1 week about your results, please check your mychart to see if there is a message first and if not, then contact our office.  In helping with this matter, you help decrease call volume, and therefore allow [place] to be able to respond to patients needs more efficiently.   Acute office visits (sick visit):  An acute visit is intended for a new  problem and are scheduled in shorter time slots to allow schedule openings for patients with new problems. This is the appropriate visit to discuss a new problem. Problems will not be addressed by phone call or Echart message. Appointment is needed if requesting treatment. In order to provide you with excellent quality medical care with proper time for you to explain your problem, have an exam and receive treatment with instructions, these appointments should be limited to one new problem per visit. If you experience a new  problem, in which you desire to be addressed, please make an acute office visit, we save openings on the schedule to accommodate you. Please do not save your new problem for any other type of visit, let us take care of it properly and quickly for you.   Follow up visits:  Depending on your condition(s) your provider will need to see you routinely in order to provide you with quality care and prescribe medication(s). Most chronic conditions (Example: hypertension, Diabetes, depression/anxiety... etc), require visits a couple times a year. Your provider will instruct you on proper follow up for your personal medical conditions and history. Please make certain to make follow up appointments for your condition as instructed. Failing to do so could result in lapse in your medication treatment/refills. If you request a refill, and are overdue to be seen on a condition, we will always provide you with a 30 day script (once) to allow you time to schedule.    Medicare wellness (well visit): - we have a wonderful Nurse Maudie Mercury), that will meet with you and provide you will yearly medicare wellness visits. These visits should occur yearly (can not be scheduled less than 1 calendar year apart) and cover preventive health, immunizations, advance directives and screenings you are entitled to yearly through your medicare benefits. Do not miss out on your entitled benefits, this is when medicare will pay for these benefits to be ordered for you.  These are strongly encouraged by your provider and is the appropriate type of visit to make certain you are up to date with all preventive health benefits. If you have not had your medicare wellness exam in the last 12 months, please make certain to schedule one by calling the office and schedule your medicare wellness with Maudie Mercury as soon as possible.   Yearly physical (well visit):  - Adults are recommended to be seen yearly for physicals. Check with your insurance and date of your  last physical, most insurances require one calendar year between physicals. Physicals include all preventive health topics, screenings, medical exam and labs that are appropriate for gender/age and history. You may have fasting labs needed at this visit. This is a well visit (not a sick visit), new problems should not be covered during this visit (see acute visit).  - Pediatric patients are seen more frequently when they are younger. Your provider will advise you on well child visit timing that is appropriate for your their age. - This is not a medicare wellness visit. Medicare wellness exams do not have an exam portion to the visit. Some medicare companies allow for a physical, some do not allow a yearly physical. If your medicare allows a yearly physical you can schedule the medicare wellness with our nurse Maudie Mercury and have your physical with your provider after, on the same day. Please check with insurance for your full benefits.   Late Policy/No Shows:  - all new patients should arrive 15-30 minutes earlier than appointment to allow [place]  time  to  obtain all personal demographics,  insurance information and for you to complete office paperwork. - All established patients should arrive 10-15 minutes earlier than appointment time to update all information and be checked in .  - In our best efforts to run on time, if you are late for your appointment you will be asked to either reschedule or if able, we will work you back into the schedule. There will be a wait time to work you back in the schedule,  depending on availability.  - If you are unable to make it to your appointment as scheduled, please call 24 hours ahead of time to allow [place] to fill the time slot with someone else who needs to be seen. If you do not cancel your appointment ahead of time, you may be charged a no show fee.

## 2018-02-09 NOTE — Progress Notes (Signed)
[name] , 11-14-1950, 67 y.o., female MRN: [id number] Patient Care Team    Relationship Specialty Notifications Start End  [name], DO PCP - General Family Medicine  01/04/16   [name], DPM Consulting Physician Podiatry  01/12/16     Chief Complaint  Patient presents with  . Hypertension    Subjective: Pt presents for routine OV follow up on hypertension.   Hypertension: Pt reports compliance with Cozaar 100  QD and HCTZ 50 qd. Blood pressures ranges at home not checked. Patient denies chest pain, shortness of breath, dizziness or lower extremity edema.  Pt takes daily ASA. Pt is not prescribed statin. BMP: 02/06/2017 GFR greater than 90, creatinine 0.62 CBC: 02/06/2017, normal with the exception of very mild elevated hemoglobin 15.4, in a smoker. Lipid: 01/17/2017 total cholesterol 165, HDL 33, LDL 103, triglycerides 139 Diet:Pt  Has been eating well now.  Exercise: routine exercise.  RF: Hypertension, history of TIA, smoker, family history of heart disease  Smoking cessation:  Using chantix, no longer smoking.   Mood disorder screening: Negative completed today 01/08/2017  Depression screen [facility] 2/9 02/09/2018 12/31/2017 01/08/2017 11/19/2016 01/11/2016  Decreased Interest 0 0 2 0 0  Down, Depressed, Hopeless 0 0 3 0 0  PHQ - 2 Score 0 0 5 0 0  Altered sleeping - - 0 - -  Tired, decreased energy - - 3 - -  Change in appetite - - 3 - -  Feeling bad or failure about yourself  - - 1 - -  Trouble concentrating - - 3 - -  Moving slowly or fidgety/restless - - 0 - -  Suicidal thoughts - - 0 - -  PHQ-9 Score - - 15 - -   GAD 7 : Generalized Anxiety Score 01/08/2017  Nervous, Anxious, on Edge 2  Control/stop worrying 2  Worry too much - different things 0  Trouble relaxing 0  Restless 0  Easily annoyed or irritable 0  Afraid - awful might happen 0  Total GAD 7 Score 4  Anxiety Difficulty Very difficult    Allergies  Allergen Reactions  . Sulfa  Antibiotics    Social History   Tobacco Use  . Smoking status: Current Some Day Smoker    Packs/day: 0.25    Types: Cigarettes  . Smokeless tobacco: Never Used  . Tobacco comment: down to 3-4 cigarettes daily  Substance Use Topics  . Alcohol use: No    Alcohol/week: 0.0 oz   Past Medical History:  Diagnosis Date  . Allergic rhinitis   . Back pain    Dr. Karin Lieu ([facility])  . Basal cell carcinoma   . Cystocele   . 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 Depression   . Fibrocystic breast   . GERD (gastroesophageal reflux disease)   . History of echocardiogram 12/2012   Dr. Daiva Huge: EF55-60%, mild TR, trace pulmonic and MR, abnl left vent diastolic  . Hypertension   . Piriformis syndrome 05/2013  . Postmenopausal   . Psoriasis 2009  . Sciatica of left side 05/2013  . TIA (transient ischemic attack) 2000  . Urinary incontinence    Past Surgical History:  Procedure Laterality Date  . CHOLECYSTECTOMY  11/2001  . LUMBAR DISC SURGERY  04/2005   R L4-5  and L5-S1  . SKIN BIOPSY Left 1999   left hand bcc vs scc  . TONSILLECTOMY AND ADENOIDECTOMY     Family  History  Problem Relation Age of Onset  . Rheum arthritis Mother   . Alcohol abuse Mother   . Heart disease Father 15       MI at 74; died  . Diabetes Father   . Dementia Maternal Grandmother    Allergies as of 02/09/2018      Reactions   Sulfa Antibiotics       Medication List        Accurate as of 02/09/18 10:03 AM. Always use your most recent med list.          aspirin EC 325 MG tablet Take 325 mg by mouth daily.   hydrochlorothiazide 50 MG tablet Commonly known as:  HYDRODIURIL Take 1 tablet (50 mg total) by mouth daily.   losartan 100 MG tablet Commonly known as:  COZAAR Take 1 tablet (100 mg total) by mouth daily.   meloxicam 15 MG tablet Commonly known as:  MOBIC Take 1 tablet (15 mg total) by mouth daily.   MULTIVITAMIN ADULT  PO Take 2 Doses by mouth.   NON FORMULARY   Omega 3 1000 MG Caps Take by mouth.   varenicline 1 MG tablet Commonly known as:  CHANTIX Take 1 tablet (1 mg total) by mouth 2 (two) times daily.       No results found for this or any previous visit (from the past 24 hour(s)). No results found.   ROS: Negative, with the exception of above mentioned in HPI  Objective:  BP 129/84 (BP Location: Left Arm, Patient Position: Sitting, Cuff Size: Large)   Pulse 68   Temp 98.1 F (36.7 C)   Resp 20   Ht 5\' 4"  (1.626 m)   Wt 190 lb (86.2 kg)   SpO2 97%   BMI 32.61 kg/m  Body mass index is 32.61 kg/m. Gen: Afebrile. No acute distress. Nontoxic, pleasant female. Obesity HENT: AT. [place].  MMM. Eyes:Pupils Equal Round Reactive to light, Extraocular movements intact,  Conjunctiva without redness, discharge or icterus. CV: RRR no murmur, no edema, +2/4 P posterior tibialis pulses Chest: CTAB, no wheeze or crackles Abd: Soft. NTND. BS present. no Masses palpated.  Skin: no rashes, purpura or petechiae.  Neuro:  Normal gait. PERLA. EOMi. Alert. Oriented x3  Psych: Normal affect, dress and demeanor. Normal speech. Normal thought content and judgment.  Assessment/Plan: [name] is a 67 y.o. female present for OV for  Essential hypertension, benign/morbid obesity - stable today. - Continue losartan 100 - HCTZ 57milligrams daily - low sodium, exercise.  - Follow-up 6 months  Tobacco use disorder/smoking cessation counseling: - stable. Using chantix. - has not smoked since her trip to [place].    Return in about 6 months (around 08/12/2018) for HTN'.  electronically signed by:  Howard Pouch, DO  [place]

## 2018-02-10 DIAGNOSIS — H25813 Combined forms of age-related cataract, bilateral: Secondary | ICD-10-CM | POA: Diagnosis not present

## 2018-02-10 DIAGNOSIS — H527 Unspecified disorder of refraction: Secondary | ICD-10-CM | POA: Diagnosis not present

## 2018-03-31 DIAGNOSIS — R350 Frequency of micturition: Secondary | ICD-10-CM | POA: Diagnosis not present

## 2018-03-31 DIAGNOSIS — R351 Nocturia: Secondary | ICD-10-CM | POA: Diagnosis not present

## 2018-03-31 DIAGNOSIS — N3946 Mixed incontinence: Secondary | ICD-10-CM | POA: Diagnosis not present

## 2018-04-28 ENCOUNTER — Other Ambulatory Visit: Payer: Self-pay | Admitting: *Deleted

## 2018-04-28 MED ORDER — MELOXICAM 15 MG PO TABS: 15.0000 mg | ORAL_TABLET | Freq: Every day | ORAL | 0 refills | Status: DC

## 2018-05-12 DIAGNOSIS — N3946 Mixed incontinence: Secondary | ICD-10-CM | POA: Diagnosis not present

## 2018-05-12 DIAGNOSIS — R351 Nocturia: Secondary | ICD-10-CM | POA: Diagnosis not present

## 2018-05-27 DIAGNOSIS — H16042 Marginal corneal ulcer, left eye: Secondary | ICD-10-CM | POA: Diagnosis not present

## 2018-07-14 DIAGNOSIS — N3946 Mixed incontinence: Secondary | ICD-10-CM | POA: Diagnosis not present

## 2018-07-14 DIAGNOSIS — R351 Nocturia: Secondary | ICD-10-CM | POA: Diagnosis not present

## 2018-07-16 ENCOUNTER — Other Ambulatory Visit: Payer: Self-pay

## 2018-07-16 MED ORDER — MELOXICAM 15 MG PO TABS: 15.0000 mg | ORAL_TABLET | Freq: Every day | ORAL | 0 refills | Status: DC

## 2018-07-16 NOTE — Telephone Encounter (Signed)
[facility] requesting refill on pts Mobic rx. Will refill #30 w no rfs, pt has an appt in 2/20.

## 2018-08-18 ENCOUNTER — Ambulatory Visit: Payer: Medicare Other | Admitting: Family Medicine

## 2018-08-20 ENCOUNTER — Ambulatory Visit (INDEPENDENT_AMBULATORY_CARE_PROVIDER_SITE_OTHER): Payer: Medicare Other | Admitting: Family Medicine

## 2018-08-20 ENCOUNTER — Encounter: Payer: Self-pay | Admitting: Family Medicine

## 2018-08-20 VITALS — BP 129/85 | HR 77 | Temp 98.4°F | Resp 16 | Ht 64.0 in | Wt 195.0 lb

## 2018-08-20 DIAGNOSIS — I10 Essential (primary) hypertension: Secondary | ICD-10-CM | POA: Diagnosis not present

## 2018-08-20 DIAGNOSIS — Z8673 Personal history of transient ischemic attack (TIA), and cerebral infarction without residual deficits: Secondary | ICD-10-CM

## 2018-08-20 DIAGNOSIS — M1990 Unspecified osteoarthritis, unspecified site: Secondary | ICD-10-CM | POA: Insufficient documentation

## 2018-08-20 DIAGNOSIS — E782 Mixed hyperlipidemia: Secondary | ICD-10-CM

## 2018-08-20 MED ORDER — HYDROCHLOROTHIAZIDE 50 MG PO TABS: 50.0000 mg | ORAL_TABLET | Freq: Every day | ORAL | 1 refills | Status: DC

## 2018-08-20 MED ORDER — MELOXICAM 15 MG PO TABS: 15.0000 mg | ORAL_TABLET | Freq: Every day | ORAL | 3 refills | Status: DC

## 2018-08-20 MED ORDER — LOSARTAN POTASSIUM 100 MG PO TABS: 100.0000 mg | ORAL_TABLET | Freq: Every day | ORAL | 1 refills | Status: DC

## 2018-08-20 NOTE — Progress Notes (Signed)
/   [name] , 04/09/1951, 68 y.o., female MRN: [id number] Patient Care Team    Relationship Specialty Notifications Start End  [name], DO PCP - General Family Medicine  01/04/16   [name], DPM Consulting Physician Podiatry  01/12/16     Chief Complaint  Patient presents with  . Follow-up    HTN    Subjective: Pt presents for routine OV follow up on hypertension.   Hypertension/morbid obesity/hyperlipidemia: Pt reports compliance with Cozaar 100  QD and HCTZ 50 qd. Blood pressures ranges at home not checked.Patient denies chest pain, shortness of breath, dizziness or lower extremity edema.  Pt takes daily ASA. Pt is not prescribed statin. BMP: 02/06/2018 within normal limits CBC: 02/06/2018  normal limits Lipid: 01/17/2017 total cholesterol 165, HDL 33, LDL 103, triglycerides 139 TSH: 02/09/2018 WNL Diet:Pt  Has been eating well now.  Exercise: routine exercise.  RF: Hypertension, history of TIA, smoker, family history of heart disease  Arthritis: Patient reports she has been using the meloxicam and it has been working well.  She does request refills today.  She denies any swelling, bruising or bleeding.  Mood disorder screening: Negative completed today 01/08/2017  Depression screen [facility] 2/9 02/09/2018 12/31/2017 01/08/2017 11/19/2016 01/11/2016  Decreased Interest 0 0 2 0 0  Down, Depressed, Hopeless 0 0 3 0 0  PHQ - 2 Score 0 0 5 0 0  Altered sleeping - - 0 - -  Tired, decreased energy - - 3 - -  Change in appetite - - 3 - -  Feeling bad or failure about yourself  - - 1 - -  Trouble concentrating - - 3 - -  Moving slowly or fidgety/restless - - 0 - -  Suicidal thoughts - - 0 - -  PHQ-9 Score - - 15 - -   GAD 7 : Generalized Anxiety Score 01/08/2017  Nervous, Anxious, on Edge 2  Control/stop worrying 2  Worry too much - different things 0  Trouble relaxing 0  Restless 0  Easily annoyed or irritable 0  Afraid - awful might happen 0  Total GAD 7 Score  4  Anxiety Difficulty Very difficult    Allergies  Allergen Reactions  . Sulfa Antibiotics    Social History   Tobacco Use  . Smoking status: Former Smoker    Types: Cigarettes  . Smokeless tobacco: Never Used  Substance Use Topics  . Alcohol use: No    Alcohol/week: 0.0 standard drinks   Past Medical History:  Diagnosis Date  . Allergic rhinitis   . Back pain    Dr. Karin Lieu ([facility])  . Basal cell carcinoma   . Cystocele   . 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 Depression   . Fibrocystic breast   . GERD (gastroesophageal reflux disease)   . History of echocardiogram 12/2012   Dr. Daiva Huge: EF55-60%, mild TR, trace pulmonic and MR, abnl left vent diastolic  . Hypertension   . Piriformis syndrome 05/2013  . Postmenopausal   . Psoriasis 2009  . Sciatica of left side 05/2013  . TIA (transient ischemic attack) 2000  . Urinary incontinence    Past Surgical History:  Procedure Laterality Date  . CHOLECYSTECTOMY  11/2001  . LUMBAR DISC SURGERY  04/2005   R L4-5  and L5-S1  . SKIN BIOPSY Left 1999   left hand bcc vs scc  . TONSILLECTOMY AND ADENOIDECTOMY     Family  History  Problem Relation Age of Onset  . Rheum arthritis Mother   . Alcohol abuse Mother   . Heart disease Father 54       MI at 19; died  . Diabetes Father   . Dementia Maternal Grandmother    Allergies as of 08/20/2018      Reactions   Sulfa Antibiotics       Medication List       Accurate as of August 20, 2018 12:15 PM. Always use your most recent med list.        aspirin EC 325 MG tablet Take 325 mg by mouth daily.   hydrochlorothiazide 50 MG tablet Commonly known as:  HYDRODIURIL Take 1 tablet (50 mg total) by mouth daily.   losartan 100 MG tablet Commonly known as:  COZAAR Take 1 tablet (100 mg total) by mouth daily.   meloxicam 15 MG tablet Commonly known as:  MOBIC Take 1 tablet (15 mg total) by mouth daily.     MULTIVITAMIN ADULT PO Take 2 Doses by mouth.   NON FORMULARY   Omega 3 1000 MG Caps Take by mouth.   oxybutynin 10 MG 24 hr tablet Commonly known as:  DITROPAN-XL TK 1 T PO D       No results found for this or any previous visit (from the past 24 hour(s)). No results found.   ROS: Negative, with the exception of above mentioned in HPI  Objective:  BP 129/85 (BP Location: Left Arm, Patient Position: Sitting, Cuff Size: Large)   Pulse 77   Temp 98.4 F (36.9 C) (Oral)   Resp 16   Ht 5\' 4"  (1.626 m)   Wt 195 lb (88.5 kg)   SpO2 95%   BMI 33.47 kg/m  Body mass index is 33.47 kg/m. Gen: Afebrile. No acute distress.  HENT: AT. .  MMM.  Eyes:Pupils Equal Round Reactive to light, Extraocular movements intact,  Conjunctiva without redness, discharge or icterus. Neck/lymp/endocrine: Supple, no lymphadenopathy CV: RRR no murmur, no edema, +2/4 P posterior tibialis pulses Chest: CTAB, no wheeze or crackles Abd: Soft. NTND. BS present. no Masses palpated.  Skin: no rashes, purpura or petechiae.  Neuro: Normal gait. PERLA. EOMi. Alert. Oriented. Psych: Normal affect, dress and demeanor. Normal speech. Normal thought content and judgment.   Assessment/Plan: BROOKS STOTZ is a 68 y.o. female present for OV for  Essential hypertension, benign/morbid obesity/hyperlipidemia/history of TIA -Stable blood pressure today.  Continue losartan 100 and HCTZ 50 mg daily. -Labs are due on next appointment, she was told to come fasting for that appointment. - Continue ASA 325 - Continue losartan 100 - HCTZ 25milligrams daily - low sodium, exercise.  - Follow-up 6 months  Arthritis:  -Patient has been using Mobic 15 mg daily and reports it helps a great deal with her function.  Refills provided to her today.  Return in about 6 months (around 02/18/2019) for HTN/fasting labs.   > 25 minutes spent with patient, >50% of time spent face to face   electronically signed by:  Howard Pouch, DO  [place]

## 2018-08-20 NOTE — Patient Instructions (Signed)
You will have fasting labs at next appt end of July or 1st week in August.  I have refilled your meds.   Please help [place] help you:  We are honored you have chosen [place] for your Primary Care home. Below you will find basic instructions that you may need to access in the future. Please help [place] help you by reading the instructions, which cover many of the frequent questions we experience.   Prescription refills and request:  -In order to allow more efficient response time, please call your pharmacy for all refills. They will forward the request electronically to [place]. This allows for the quickest possible response. Request left on a nurse line can take longer to refill, since these are checked as time allows between office patients and other phone calls.  - refill request can take up to 3-5 working days to complete.  - If request is sent electronically and request is appropiate, it is usually completed in 1-2 business days.  - all patients will need to be seen routinely for all chronic medical conditions requiring prescription medications (see follow-up below). If you are overdue for follow up on your condition, you will be asked to make an appointment and we will call in enough medication to cover you until your appointment (up to 30 days).  - all controlled substances will require a face to face visit to request/refill.  - if you desire your prescriptions to go through a new pharmacy, and have an active script at original pharmacy, you will need to call your pharmacy and have scripts transferred to new pharmacy. This is completed between the pharmacy locations and not by your provider.    Results: If any images or labs were ordered, it can take up to 1 week to get results depending on the test ordered and the lab/facility running and resulting the test. - Normal or stable results, which do not need further discussion, may be released to your mychart immediately with attached note to you. A  call may not be generated for normal results. Please make certain to sign up for mychart. If you have questions on how to activate your mychart you can call the front office.  - If your results need further discussion, our office will attempt to contact you via phone, and if unable to reach you after 2 attempts, we will release your abnormal result to your mychart with instructions.  - All results will be automatically released in mychart after 1 week.  - Your provider will provide you with explanation and instruction on all relevant material in your results. Please keep in mind, results and labs may appear confusing or abnormal to the untrained eye, but it does not mean they are actually abnormal for you personally. If you have any questions about your results that are not covered, or you desire more detailed explanation than what was provided, you should make an appointment with your provider to do so.   Our office handles many outgoing and incoming calls daily. If we have not contacted you within 1 week about your results, please check your mychart to see if there is a message first and if not, then contact our office.  In helping with this matter, you help decrease call volume, and therefore allow [place] to be able to respond to patients needs more efficiently.   Acute office visits (sick visit):  An acute visit is intended for a new problem and are scheduled in shorter time slots to  allow schedule openings for patients with new problems. This is the appropriate visit to discuss a new problem. Problems will not be addressed by phone call or Echart message. Appointment is needed if requesting treatment. In order to provide you with excellent quality medical care with proper time for you to explain your problem, have an exam and receive treatment with instructions, these appointments should be limited to one new problem per visit. If you experience a new problem, in which you desire to be addressed, please  make an acute office visit, we save openings on the schedule to accommodate you. Please do not save your new problem for any other type of visit, let us take care of it properly and quickly for you.   Follow up visits:  Depending on your condition(s) your provider will need to see you routinely in order to provide you with quality care and prescribe medication(s). Most chronic conditions (Example: hypertension, Diabetes, depression/anxiety... etc), require visits a couple times a year. Your provider will instruct you on proper follow up for your personal medical conditions and history. Please make certain to make follow up appointments for your condition as instructed. Failing to do so could result in lapse in your medication treatment/refills. If you request a refill, and are overdue to be seen on a condition, we will always provide you with a 30 day script (once) to allow you time to schedule.    Medicare wellness (well visit): - we have a wonderful Nurse Maudie Mercury), that will meet with you and provide you will yearly medicare wellness visits. These visits should occur yearly (can not be scheduled less than 1 calendar year apart) and cover preventive health, immunizations, advance directives and screenings you are entitled to yearly through your medicare benefits. Do not miss out on your entitled benefits, this is when medicare will pay for these benefits to be ordered for you.  These are strongly encouraged by your provider and is the appropriate type of visit to make certain you are up to date with all preventive health benefits. If you have not had your medicare wellness exam in the last 12 months, please make certain to schedule one by calling the office and schedule your medicare wellness with Maudie Mercury as soon as possible.   Yearly physical (well visit):  - Adults are recommended to be seen yearly for physicals. Check with your insurance and date of your last physical, most insurances require one calendar  year between physicals. Physicals include all preventive health topics, screenings, medical exam and labs that are appropriate for gender/age and history. You may have fasting labs needed at this visit. This is a well visit (not a sick visit), new problems should not be covered during this visit (see acute visit).  - Pediatric patients are seen more frequently when they are younger. Your provider will advise you on well child visit timing that is appropriate for your their age. - This is not a medicare wellness visit. Medicare wellness exams do not have an exam portion to the visit. Some medicare companies allow for a physical, some do not allow a yearly physical. If your medicare allows a yearly physical you can schedule the medicare wellness with our nurse Maudie Mercury and have your physical with your provider after, on the same day. Please check with insurance for your full benefits.   Late Policy/No Shows:  - all new patients should arrive 15-30 minutes earlier than appointment to allow [place] time  to  obtain all personal demographics,  insurance information and for you to complete office paperwork. - All established patients should arrive 10-15 minutes earlier than appointment time to update all information and be checked in .  - In our best efforts to run on time, if you are late for your appointment you will be asked to either reschedule or if able, we will work you back into the schedule. There will be a wait time to work you back in the schedule,  depending on availability.  - If you are unable to make it to your appointment as scheduled, please call 24 hours ahead of time to allow [place] to fill the time slot with someone else who needs to be seen. If you do not cancel your appointment ahead of time, you may be charged a no show fee.

## 2018-09-22 DIAGNOSIS — H20013 Primary iridocyclitis, bilateral: Secondary | ICD-10-CM | POA: Diagnosis not present

## 2018-09-29 DIAGNOSIS — H20013 Primary iridocyclitis, bilateral: Secondary | ICD-10-CM | POA: Diagnosis not present

## 2019-02-10 ENCOUNTER — Encounter: Payer: Self-pay | Admitting: Family Medicine

## 2019-02-10 ENCOUNTER — Other Ambulatory Visit: Payer: Self-pay

## 2019-02-10 ENCOUNTER — Ambulatory Visit (INDEPENDENT_AMBULATORY_CARE_PROVIDER_SITE_OTHER): Payer: Medicare Other | Admitting: Family Medicine

## 2019-02-10 VITALS — BP 134/82 | HR 63 | Temp 97.8°F | Ht 64.0 in | Wt 166.1 lb

## 2019-02-10 DIAGNOSIS — Z131 Encounter for screening for diabetes mellitus: Secondary | ICD-10-CM | POA: Diagnosis not present

## 2019-02-10 DIAGNOSIS — E782 Mixed hyperlipidemia: Secondary | ICD-10-CM

## 2019-02-10 DIAGNOSIS — I10 Essential (primary) hypertension: Secondary | ICD-10-CM | POA: Diagnosis not present

## 2019-02-10 DIAGNOSIS — R7309 Other abnormal glucose: Secondary | ICD-10-CM

## 2019-02-10 DIAGNOSIS — Z0000 Encounter for general adult medical examination without abnormal findings: Secondary | ICD-10-CM | POA: Diagnosis not present

## 2019-02-10 DIAGNOSIS — E663 Overweight: Secondary | ICD-10-CM | POA: Insufficient documentation

## 2019-02-10 DIAGNOSIS — H9312 Tinnitus, left ear: Secondary | ICD-10-CM | POA: Insufficient documentation

## 2019-02-10 DIAGNOSIS — H6123 Impacted cerumen, bilateral: Secondary | ICD-10-CM | POA: Insufficient documentation

## 2019-02-10 DIAGNOSIS — Z8673 Personal history of transient ischemic attack (TIA), and cerebral infarction without residual deficits: Secondary | ICD-10-CM | POA: Diagnosis not present

## 2019-02-10 LAB — CBC
HCT: 45.2 % (ref 36.0–46.0)
Hemoglobin: 15.8 g/dL — ABNORMAL HIGH (ref 12.0–15.0)
MCHC: 34.8 g/dL (ref 30.0–36.0)
MCV: 95 fl (ref 78.0–100.0)
Platelets: 154 10*3/uL (ref 150.0–400.0)
RBC: 4.76 Mil/uL (ref 3.87–5.11)
RDW: 13.2 % (ref 11.5–15.5)
WBC: 7 10*3/uL (ref 4.0–10.5)

## 2019-02-10 LAB — COMPREHENSIVE METABOLIC PANEL
ALT: 12 U/L (ref 0–35)
AST: 16 U/L (ref 0–37)
Albumin: 4.3 g/dL (ref 3.5–5.2)
Alkaline Phosphatase: 68 U/L (ref 39–117)
BUN: 25 mg/dL — ABNORMAL HIGH (ref 6–23)
CO2: 31 mEq/L (ref 19–32)
Calcium: 9.9 mg/dL (ref 8.4–10.5)
Chloride: 102 mEq/L (ref 96–112)
Creatinine, Ser: 0.59 mg/dL (ref 0.40–1.20)
GFR: 100.89 mL/min (ref >60.00)
Glucose, Bld: 105 mg/dL — ABNORMAL HIGH (ref 70–99)
Potassium: 4.4 mEq/L (ref 3.5–5.1)
Sodium: 140 mEq/L (ref 135–145)
Total Bilirubin: 1 mg/dL (ref 0.2–1.2)
Total Protein: 6.6 g/dL (ref 6.0–8.3)

## 2019-02-10 LAB — TSH: TSH: 1.76 u[IU]/mL (ref 0.35–4.50)

## 2019-02-10 LAB — LIPID PANEL
Cholesterol: 160 mg/dL (ref 0–200)
HDL: 38.8 mg/dL — ABNORMAL LOW (ref >39.00)
LDL Cholesterol: 100 mg/dL — ABNORMAL HIGH (ref 0–99)
NonHDL: 120.91
Total CHOL/HDL Ratio: 4
Triglycerides: 105 mg/dL (ref 0.0–149.0)
VLDL: 21 mg/dL (ref 0.0–40.0)

## 2019-02-10 LAB — HEMOGLOBIN A1C: Hgb A1c MFr Bld: 5.1 % (ref 4.6–6.5)

## 2019-02-10 MED ORDER — DEBROX 6.5 % OT SOLN: 5.0000 [drp] | Freq: Two times a day (BID) | OTIC | 0 refills | Status: DC

## 2019-02-10 MED ORDER — LOSARTAN POTASSIUM 100 MG PO TABS: 100.0000 mg | ORAL_TABLET | Freq: Every day | ORAL | 1 refills | Status: DC

## 2019-02-10 MED ORDER — HYDROCHLOROTHIAZIDE 50 MG PO TABS: 50.0000 mg | ORAL_TABLET | Freq: Every day | ORAL | 1 refills | Status: DC

## 2019-02-10 NOTE — Progress Notes (Signed)
Medicare AWV, subsequent.  Chief Complaint  Patient presents with  . Medicare Wellness    No complaints. No recent mammogram. Does not get pap smears.     Patient Care Team    Relationship Specialty Notifications Start End  [name], DO PCP - General Family Medicine  01/04/16   [name], DPM Consulting Physician Podiatry  01/12/16      History of Present Ilness: [name], 68 y.o. , female presents today for Medicare wellness visit.  Hypertension/morbid obesity/hyperlipidemia: Pt reports  with Cozaar 100  QD and HCTZ 50 qd. Blood pressures ranges at home not checked. Patient denies chest pain, shortness of breath, dizziness or lower extremity edema .  Pt takes daily ASA. Pt is not prescribed statin. BMP: 02/06/2018 within normal limits CBC: 02/06/2018  normal limits Lipid: 01/17/2017 total cholesterol 165, HDL 33, LDL 103, triglycerides 139 TSH: 02/09/2018 WNL Diet:Pt  Has been eating well now.  Exercise: routine exercise.  RF: Hypertension, history of TIA, smoker, family history of heart disease  Back: Mild back pain after working.  Takes Mobic for arthritic pain.  Left ear ringing: Patient reports her ear has been ringing since late July.  She has been in the pool and is wondering if that has something to do with her pain.  She did use some drops in her ears, and this did not make any difference.  She does occasionally use Q-tips in her ears.  She denies any fever or decreased hearing.  She denies any pain.  Health maintenance:  Colonoscopy: Declined all types of screening. Understands risk. Mammogram: no fhx, declines mammogram. SBE.  Cervical cancer screening: completed (2016) normal. > 65, not indicated. Last menstrual cycle at age 46.  Immunizations: tdap 2015, zostavax completed, declines flu always.  Declines pneumonia vaccine.  Declined Shingrix vaccine today.   Infectious disease screening: Hep C screening completed. DEXA:declines future  screening. She does take vit D daily. Glaucoma screen: Dr. Domingo Cocking every 6 months.  Hearing: Whisper test, no barriers identified.   Past medical, surgical, family and social histories reviewed (including experiences with illnesses, hospital stays, operations, injuries, and treatments):  Past Medical History:  Diagnosis Date  . Allergic rhinitis   . Back pain    Dr. Karin Lieu ([facility])  . Basal cell carcinoma   . Cystocele   . 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 Depression   . Fibrocystic breast   . GERD (gastroesophageal reflux disease)   . History of echocardiogram 12/2012   Dr. Daiva Huge: EF55-60%, mild TR, trace pulmonic and MR, abnl left vent diastolic  . Hypertension   . Piriformis syndrome 05/2013  . Postmenopausal   . Psoriasis 2009  . Sciatica of left side 05/2013  . TIA (transient ischemic attack) 2000  . Urinary incontinence    All allergies reviewed Allergies  Allergen Reactions  . Sulfa Antibiotics    Past Surgical History:  Procedure Laterality Date  . CHOLECYSTECTOMY  11/2001  . LUMBAR DISC SURGERY  04/2005   R L4-5  and L5-S1  . SKIN BIOPSY Left 1999   left hand bcc vs scc  . TONSILLECTOMY AND ADENOIDECTOMY     Family History  Problem Relation Age of Onset  . Rheum arthritis Mother   . Alcohol abuse Mother   . Heart disease Father 3       MI at 84; died  . Diabetes Father   . Dementia  Maternal Grandmother    Social History   Social History Narrative   Widower 2018 (Damon). 2 sons.   College. Retired.    Former smoker (quit 04/2005- 30+ pack year), occasional etoh, no drugs   Drinks caffeine, uses herbal remedies, daily vitamin use   Wears her seatbelt, wears her bicycle helmet   Exercises routinely   Smoke detector in the home.    Firearms in the home (locked)   Feels safe in her relationships.     All medications verified Allergies as of 02/10/2019      Reactions   Sulfa  Antibiotics       Medication List       Accurate as of February 10, 2019  8:18 AM. If you have any questions, ask your nurse or doctor.        aspirin EC 325 MG tablet Take 325 mg by mouth daily.   hydrochlorothiazide 50 MG tablet Commonly known as: HYDRODIURIL Take 1 tablet (50 mg total) by mouth daily.   losartan 100 MG tablet Commonly known as: COZAAR Take 1 tablet (100 mg total) by mouth daily.   meloxicam 15 MG tablet Commonly known as: MOBIC Take 1 tablet (15 mg total) by mouth daily.   MULTIVITAMIN ADULT PO Take 2 Doses by mouth.   NON FORMULARY   Omega 3 1000 MG Caps Take by mouth.   oxybutynin 10 MG 24 hr tablet Commonly known as: DITROPAN-XL TK 1 T PO D       Depression screen [facility] 2/9 02/10/2019 02/09/2018 12/31/2017 01/08/2017 11/19/2016  Decreased Interest 0 0 0 2 0  Down, Depressed, Hopeless 0 0 0 3 0  PHQ - 2 Score 0 0 0 5 0  Altered sleeping - - - 0 -  Tired, decreased energy - - - 3 -  Change in appetite - - - 3 -  Feeling bad or failure about yourself  - - - 1 -  Trouble concentrating - - - 3 -  Moving slowly or fidgety/restless - - - 0 -  Suicidal thoughts - - - 0 -  PHQ-9 Score - - - 15 -   GAD 7 : Generalized Anxiety Score 01/08/2017  Nervous, Anxious, on Edge 2  Control/stop worrying 2  Worry too much - different things 0  Trouble relaxing 0  Restless 0  Easily annoyed or irritable 0  Afraid - awful might happen 0  Total GAD 7 Score 4  Anxiety Difficulty Very difficult    Cognitive assessment:  Word recall (daisy, blue, church): 3/3  Immunizations: Immunization History  Administered Date(s) Administered  . Tdap 12/21/2013  . Zoster 06/24/2013    Diet: Regular  Functional Status Survey: Get up and go test: steady and less than 20 seconds.  Is the patient deaf or have difficulty hearing?: No Does the patient have difficulty seeing, even when wearing glasses/contacts?: No Does the patient have difficulty concentrating,  remembering, or making decisions?: No Does the patient have difficulty walking or climbing stairs?: No Does the patient have difficulty dressing or bathing?: No Does the patient have difficulty doing errands alone such as visiting a doctor's office or shopping?: No     Fall Risk  02/10/2019 02/09/2018 12/31/2017 11/19/2016 01/11/2016  Falls in the past year? 0 No No No No  Follow up Falls prevention discussed;Education provided;Falls evaluation completed - - - -    Advanced Care Planning: She has and will bring in to copy.   Cardiovascular Screening Blood Tests Lipid  Panel     Component Value Date/Time   CHOL 165 02/06/2017 0844   TRIG 139.0 02/06/2017 0844   HDL 33.40 (L) 02/06/2017 0844   CHOLHDL 5 02/06/2017 0844   VLDL 27.8 02/06/2017 0844   LDLCALC 103 (H) 02/06/2017 0844   BP 134/82 (BP Location: Right Arm, Patient Position: Sitting, Cuff Size: Normal)   Pulse 63   Temp 97.8 F (36.6 C) (Temporal)   Ht 5\' 4"  (1.626 m)   Wt 166 lb 2 oz (75.4 kg)   SpO2 97%   BMI 28.52 kg/m  Gen: Afebrile. No acute distress.  Nontoxic in presentation, well-developed, well-nourished, overweight female. HENT: AT. [place].  Left tympanic membrane unable to be visualized secondary to cerumen impaction.. MMM. Bilateral nares within normal limits. Throat without erythema or exudates.  No cough, no hoarseness. Eyes:Pupils Equal Round Reactive to light, Extraocular movements intact,  Conjunctiva without redness, discharge or icterus. Neck/lymp/endocrine: Supple, no lymphadenopathy, no thyromegaly CV: RRR no murmur, no edema, +2/4 P posterior tibialis pulses Chest: CTAB, no wheeze or crackles Abd: Soft.  Flat. NTND. BS present.  No masses palpated.  Skin: No rashes, purpura or petechiae.  Neuro:  Normal gait. PERLA. EOMi. Alert. Oriented x3  Psych: Normal affect, dress and demeanor. Normal speech. Normal thought content and judgment.  Assessment and Plan: Overweight (BMI 25.0-29.9) Diet and exercise  recommended.   Essential hypertension, benign/morbid obesity/hyperlipidemia/history of TIA -Stable Continue losartan 100 and HCTZ 50 mg daily.  Refills provided today - Continue ASA 325 - Continue losartan 100 - HCTZ 35milligrams daily - low sodium, exercise.  - Lipid panel - CBC - Comprehensive metabolic panel - TSH - losartan (COZAAR) 100 MG tablet; Take 1 tablet (100 mg total) by mouth daily.  Dispense: 90 tablet; Refill: 1 - hydrochlorothiazide (HYDRODIURIL) 50 MG tablet; Take 1 tablet (50 mg total) by mouth daily.  Dispense: 90 tablet; Refill: 1 - Follow-up 6 months  Diabetes mellitus screening/elevated glucose - Hemoglobin A1c  Bilateral impacted cerumen/tinnitus of left ear Suspect her symptoms might be secondary to cerumen against the tympanic membrane. Debrox prescribed today.  Instructions provided to patient.  May follow-up in 1 to 2 weeks if needing rechecked or flushed in the office.  Medicare annual wellness visit, subsequent -Patient declines all preventative health screening including mammogram, colonoscopy, Cologuard, FOBT, DEXA, shingles vaccine, pneumonia vaccine, flu vaccine.  She has been counseled on the risks and preventative care.  Medicare wellness completed today as well as additional greater than 15 minutes face-to-face, covering chronic and acute conditions.  Follow-up chronic medical conditions in 6 months. Follow-up monitoring of the ears/cerumen impaction 1 to 2 weeks if needed only.   Electronically Signed by: Howard Pouch, DO [place]

## 2019-02-10 NOTE — Patient Instructions (Addendum)
Health Maintenance After Age 70 After age 70, you are at a higher risk for certain long-term diseases and infections as well as injuries from falls. Falls are a major cause of broken bones and head injuries in people who are older than age 70. Getting regular preventive care can help to keep you healthy and well. Preventive care includes getting regular testing and making lifestyle changes as recommended by your health care provider. Talk with your health care provider about:  Which screenings and tests you should have. A screening is a test that checks for a disease when you have no symptoms.  A diet and exercise plan that is right for you. What should I know about screenings and tests to prevent falls? Screening and testing are the best ways to find a health problem early. Early diagnosis and treatment give you the best chance of managing medical conditions that are common after age 70. Certain conditions and lifestyle choices may make you more likely to have a fall. Your health care provider may recommend:  Regular vision checks. Poor vision and conditions such as cataracts can make you more likely to have a fall. If you Cal[place] glasses, make sure to get your prescription updated if your vision changes.  Medicine review. Work with your health care provider to regularly review all of the medicines you are taking, including over-the-counter medicines. Ask your health care provider about any side effects that may make you more likely to have a fall. Tell your health care provider if any medicines that you take make you feel dizzy or sleepy.  Osteoporosis screening. Osteoporosis is a condition that causes the bones to get weaker. This can make the bones weak and cause them to break more easily.  Blood pressure screening. Blood pressure changes and medicines to control blood pressure can make you feel dizzy.  Strength and balance checks. Your health care provider may recommend certain tests to check your  strength and balance while standing, walking, or changing positions.  Foot health exam. Foot pain and numbness, as well as not wearing proper footwear, can make you more likely to have a fall.  Depression screening. You may be more likely to have a fall if you have a fear of falling, feel emotionally low, or feel unable to do activities that you used to do.  Alcohol use screening. Using too much alcohol can affect your balance and may make you more likely to have a fall. What actions can I take to lower my risk of falls? General instructions  Talk with your health care provider about your risks for falling. Tell your health care provider if: ? You fall. Be sure to tell your health care provider about all falls, even ones that seem minor. ? You feel dizzy, sleepy, or off-balance.  Take over-the-counter and prescription medicines only as told by your health care provider. These include any supplements.  Eat a healthy diet and maintain a healthy weight. A healthy diet includes low-fat dairy products, low-fat (lean) meats, and fiber from whole grains, beans, and lots of fruits and vegetables. Home safety  Remove any tripping hazards, such as rugs, cords, and clutter.  Install safety equipment such as grab bars in bathrooms and safety rails on stairs.  Keep rooms and walkways well-lit. Activity   Follow a regular exercise program to stay fit. This will help you maintain your balance. Ask your health care provider what types of exercise are appropriate for you.  If you need a cane or   walker, use it as recommended by your health care provider.  [place] supportive shoes that have nonskid soles. Lifestyle  Do not drink alcohol if your health care provider tells you not to drink.  If you drink alcohol, limit how much you have: ? 0-1 drink a day for women. ? 0-2 drinks a day for men.  Be aware of how much alcohol is in your drink. In the [place] one drink equals one typical bottle of beer (12  oz), one-half glass of wine (5 oz), or one shot of hard liquor (1 oz).  Do not use any products that contain nicotine or tobacco, such as cigarettes and e-cigarettes. If you need help quitting, ask your health care provider. Summary  Having a healthy lifestyle and getting preventive care can help to protect your health and wellness after age 37.  Screening and testing are the best way to find a health problem early and help you avoid having a fall. Early diagnosis and treatment give you the best chance for managing medical conditions that are more common for people who are older than age 32.  Falls are a major cause of broken bones and head injuries in people who are older than age 48. Take precautions to prevent a fall at home.  Work with your health care provider to learn what changes you can make to improve your health and wellness and to prevent falls. This information is not intended to replace advice given to you by your health care provider. Make sure you discuss any questions you have with your health care provider. Document Released: 05/14/2017 Document Revised: 10/22/2018 Document Reviewed: 05/14/2017 Elsevier Patient Education  [place] BMI: Body mass index is 28.52 kg/m.  BMI for Adults  Body mass index (BMI) is a number that is calculated from a person's weight and height. BMI may help to estimate how much of a person's weight is composed of fat. BMI can help identify those who may be at higher risk for certain medical problems. How is BMI used with adults? BMI is used as a screening tool to identify possible weight problems. It is used to check whether a person is obese, overweight, healthy weight, or underweight. How is BMI calculated? BMI measures your weight and compares it to your height. This can be done either in [place] ([place] or metric measurements. Note that charts are available to help you find your BMI quickly and easily without having to do these  calculations yourself. To calculate your BMI in English ([place] measurements, your health care provider will: 1. Measure your weight in pounds (lb). 2. Multiply the number of pounds by 703. ? For example, for a person who weighs 180 lb, multiply that number by 703, which equals 126,540. 3. Measure your height in inches (in). Then multiply that number by itself to get a measurement called "inches squared." ? For example, for a person who is 70 in tall, the "inches squared" measurement is 70 in x 70 in, which equals 4900 inches squared. 4. Divide the total from Step 2 (number of lb x 703) by the total from Step 3 (inches squared): 126,540  4900 = 25.8. This is your BMI. To calculate your BMI in metric measurements, your health care provider will: 1. Measure your weight in kilograms (kg). 2. Measure your height in meters (m). Then multiply that number by itself to get a measurement called "meters squared." ? For example, for a person who is 1.75 m tall, the "meters  squared" measurement is 1.75 m x 1.75 m, which is equal to 3.1 meters squared. 3. Divide the number of kilograms (your weight) by the meters squared number. In this example: 70  3.1 = 22.6. This is your BMI. How is BMI interpreted? To interpret your results, your health care provider will use BMI charts to identify whether you are underweight, normal weight, overweight, or obese. The following guidelines will be used:  Underweight: BMI less than 18.5.  Normal weight: BMI between 18.5 and 24.9.  Overweight: BMI between 25 and 29.9.  Obese: BMI of 30 and above. Please note:  Weight includes both fat and muscle, so someone with a muscular build, such as an athlete, may have a BMI that is higher than 24.9. In cases like these, BMI is not an accurate measure of body fat.  To determine if excess body fat is the cause of a BMI of 25 or higher, further assessments may need to be done by a health care provider.  BMI is usually  interpreted in the same way for men and women. Why is BMI a useful tool? BMI is useful in two ways:  Identifying a weight problem that may be related to a medical condition, or that may increase the risk for medical problems.  Promoting lifestyle and diet changes in order to reach a healthy weight. Summary  Body mass index (BMI) is a number that is calculated from a person's weight and height.  BMI may help to estimate how much of a person's weight is composed of fat. BMI can help identify those who may be at higher risk for certain medical problems.  BMI can be measured using English measurements or metric measurements.  To interpret your results, your health care provider will use BMI charts to identify whether you are underweight, normal weight, overweight, or obese. This information is not intended to replace advice given to you by your health care provider. Make sure you discuss any questions you have with your health care provider. Document Released: 03/12/2004 Document Revised: 06/13/2017 Document Reviewed: 05/14/2017 Elsevier Patient Education  2020 [employer].  If you change your mind about your preventive screenings- please make an appt and we can discuss and order them for you.

## 2019-02-15 ENCOUNTER — Encounter: Payer: Self-pay | Admitting: Family Medicine

## 2019-02-15 ENCOUNTER — Other Ambulatory Visit: Payer: Self-pay

## 2019-02-15 ENCOUNTER — Ambulatory Visit (INDEPENDENT_AMBULATORY_CARE_PROVIDER_SITE_OTHER): Payer: Medicare Other | Admitting: Family Medicine

## 2019-02-15 VITALS — BP 127/79 | HR 70 | Temp 98.2°F | Resp 18 | Ht 64.0 in | Wt 169.4 lb

## 2019-02-15 DIAGNOSIS — H6123 Impacted cerumen, bilateral: Secondary | ICD-10-CM

## 2019-02-15 NOTE — Progress Notes (Signed)
[name] , December 21, 1950, 68 y.o., female MRN: [id number] Patient Care Team    Relationship Specialty Notifications Start End  [name], DO PCP - General Family Medicine  01/04/16   [name], DPM Consulting Physician Podiatry  01/12/16     Chief Complaint  Patient presents with  . Ear Pain    Pt F/U with ear pain. It is getting worse and pt has ringing in ears. She has used drops BID and warm water flushes.      Subjective: Pt presents for an OV with complaints of left ear pain and ringing of her ears. She was noted to have bilateral cerumen impaction at her CPE last week. She has been using the debrox flushes BID since that time. She has not had any cerumen return. She would like her ears cleaned today.    Depression screen [facility] 2/9 02/10/2019 02/09/2018 12/31/2017 01/08/2017 11/19/2016  Decreased Interest 0 0 0 2 0  Down, Depressed, Hopeless 0 0 0 3 0  PHQ - 2 Score 0 0 0 5 0  Altered sleeping - - - 0 -  Tired, decreased energy - - - 3 -  Change in appetite - - - 3 -  Feeling bad or failure about yourself  - - - 1 -  Trouble concentrating - - - 3 -  Moving slowly or fidgety/restless - - - 0 -  Suicidal thoughts - - - 0 -  PHQ-9 Score - - - 15 -    Allergies  Allergen Reactions  . Sulfa Antibiotics    Social History   Social History Narrative   Widower 2018 (Damon). 2 sons.   College. Retired.    Former smoker (quit 04/2005- 30+ pack year), occasional etoh, no drugs   Drinks caffeine, uses herbal remedies, daily vitamin use   Wears her seatbelt, wears her bicycle helmet   Exercises routinely   Smoke detector in the home.    Firearms in the home (locked)   Feels safe in her relationships.    Past Medical History:  Diagnosis Date  . Allergic rhinitis   . Back pain    Dr. Karin Lieu ([facility])  . Basal cell carcinoma   . Cystocele   . 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 Depression   .  Fibrocystic breast   . GERD (gastroesophageal reflux disease)   . History of echocardiogram 12/2012   Dr. Daiva Huge: EF55-60%, mild TR, trace pulmonic and MR, abnl left vent diastolic  . Hypertension   . Piriformis syndrome 05/2013  . Postmenopausal   . Psoriasis 2009  . Sciatica of left side 05/2013  . TIA (transient ischemic attack) 2000  . Urinary incontinence    Past Surgical History:  Procedure Laterality Date  . CHOLECYSTECTOMY  11/2001  . LUMBAR DISC SURGERY  04/2005   R L4-5  and L5-S1  . SKIN BIOPSY Left 1999   left hand bcc vs scc  . TONSILLECTOMY AND ADENOIDECTOMY     Family History  Problem Relation Age of Onset  . Rheum arthritis Mother   . Alcohol abuse Mother   . Heart disease Father 58       MI at 52; died  . Diabetes Father   . Dementia Maternal Grandmother    Allergies as of 02/15/2019      Reactions   Sulfa Antibiotics       Medication List  Accurate as of February 15, 2019  4:09 PM. If you have any questions, ask your nurse or doctor.        aspirin EC 325 MG tablet Take 325 mg by mouth daily.   Debrox 6.5 % OTIC solution Generic drug: carbamide peroxide Place 5 drops into both ears 2 (two) times daily.   hydrochlorothiazide 50 MG tablet Commonly known as: HYDRODIURIL Take 1 tablet (50 mg total) by mouth daily.   losartan 100 MG tablet Commonly known as: COZAAR Take 1 tablet (100 mg total) by mouth daily.   meloxicam 15 MG tablet Commonly known as: MOBIC Take 1 tablet (15 mg total) by mouth daily.   MULTIVITAMIN ADULT PO Take 2 Doses by mouth.   NON FORMULARY   Omega 3 1000 MG Caps Take by mouth.   oxybutynin 10 MG 24 hr tablet Commonly known as: DITROPAN-XL TK 1 T PO D       All past medical history, surgical history, allergies, family history, immunizations andmedications were updated in the EMR today and reviewed under the history and medication portions of their EMR.     ROS: Negative, with the exception of above  mentioned in HPI   Objective:  BP 127/79 (BP Location: Left Arm, Patient Position: Sitting, Cuff Size: Normal)   Pulse 70   Temp 98.2 F (36.8 C) (Temporal)   Resp 18   Ht 5\' 4"  (1.626 m)   Wt 169 lb 6 oz (76.8 kg)   SpO2 97%   BMI 29.07 kg/m  Body mass index is 29.07 kg/m. Gen: Afebrile. No acute distress. Nontoxic in appearance, well developed, well nourished.  HENT: AT. [place]. Bilateral TM unable to be visualized 2/2 to cerumen impaction. MMM Neuro: Normal gait. PERLA. EOMi. Alert. Oriented x3   No exam data present No results found. No results found for this or any previous visit (from the past 24 hour(s)).  Assessment/Plan: [name] is a 68 y.o. female present for OV for  Hearing loss due to cerumen impaction, bilateral Procedure: Cerumen disimpaction Water-peroxide solution was applied and gentle ear lavage performed on bilateral ear(s).  There were no complications.  Tympanic membrane was visualized after disimpaction.  Tympanic membrane(s) intact.  Auditory canal(s) with mild irritation left EAC and mild erythema left TM.  Patient tolerated procedure well.  Patient reported relief of symptoms after removal of cerumen. Mild ringing remained but hearing improved.  - f/u 2 weeks >> ensure TM/EAC irritation resolves and she does not also need tx for swimmers ear.    Reviewed expectations re: course of current medical issues.  Discussed self-management of symptoms.  Outlined signs and symptoms indicating need for more acute intervention.  Patient verbalized understanding and all questions were answered.  Patient received an After-Visit Summary.    No orders of the defined types were placed in this encounter.    Note is dictated utilizing voice recognition software. Although note has been proof read prior to signing, occasional typographical errors still can be missed. If any questions arise, please do not hesitate to call for verification.   electronically  signed by:  Howard Pouch, DO  [place]

## 2019-02-15 NOTE — Patient Instructions (Signed)
Give the ears about 2 weeks to heal. You should see an improvement over this time. Recheck in 2 weeks.

## 2019-02-16 DIAGNOSIS — H527 Unspecified disorder of refraction: Secondary | ICD-10-CM | POA: Diagnosis not present

## 2019-02-16 DIAGNOSIS — H25813 Combined forms of age-related cataract, bilateral: Secondary | ICD-10-CM | POA: Diagnosis not present

## 2019-03-08 ENCOUNTER — Ambulatory Visit (INDEPENDENT_AMBULATORY_CARE_PROVIDER_SITE_OTHER): Payer: Medicare Other | Admitting: Family Medicine

## 2019-03-08 ENCOUNTER — Encounter: Payer: Self-pay | Admitting: Family Medicine

## 2019-03-08 ENCOUNTER — Other Ambulatory Visit: Payer: Self-pay

## 2019-03-08 VITALS — BP 128/71 | HR 72 | Temp 98.4°F | Resp 17 | Ht 64.0 in | Wt 166.1 lb

## 2019-03-08 DIAGNOSIS — H9192 Unspecified hearing loss, left ear: Secondary | ICD-10-CM

## 2019-03-08 DIAGNOSIS — H9203 Otalgia, bilateral: Secondary | ICD-10-CM | POA: Diagnosis not present

## 2019-03-08 NOTE — Patient Instructions (Signed)
Ears look good. No infection.  Use the debrox solution every 2-4 weeks to wax to minimum and prevent build up.

## 2019-03-08 NOTE — Progress Notes (Signed)
[name] , 09-07-1950, 68 y.o., female MRN: [id number] Patient Care Team    Relationship Specialty Notifications Start End  [name], DO PCP - General Family Medicine  01/04/16   [name], DPM Consulting Physician Podiatry  01/12/16     Chief Complaint  Patient presents with  . Follow-up    Ear is doing well. No pain. No difficulty hearing      Subjective: Pt presents for an OV to follow up on left ear.  She has been using debrox and she had a lavage of her ears completed 2 weeks ago. She has not used the debrox since lavage. Her symptoms have resolved, her hearing has been good- no muffle sounds. No pain or fever.  Prior note:  left ear pain and ringing of her ears. She was noted to have bilateral cerumen impaction at her CPE last week. She has been using the debrox flushes BID since that time. She has not had any cerumen return. She would like her ears cleaned today.    Depression screen [facility] 2/9 02/10/2019 02/09/2018 12/31/2017 01/08/2017 11/19/2016  Decreased Interest 0 0 0 2 0  Down, Depressed, Hopeless 0 0 0 3 0  PHQ - 2 Score 0 0 0 5 0  Altered sleeping - - - 0 -  Tired, decreased energy - - - 3 -  Change in appetite - - - 3 -  Feeling bad or failure about yourself  - - - 1 -  Trouble concentrating - - - 3 -  Moving slowly or fidgety/restless - - - 0 -  Suicidal thoughts - - - 0 -  PHQ-9 Score - - - 15 -    Allergies  Allergen Reactions  . Sulfa Antibiotics    Social History   Social History Narrative   Widower 2018 (Damon). 2 sons.   College. Retired.    Former smoker (quit 04/2005- 30+ pack year), occasional etoh, no drugs   Drinks caffeine, uses herbal remedies, daily vitamin use   Wears her seatbelt, wears her bicycle helmet   Exercises routinely   Smoke detector in the home.    Firearms in the home (locked)   Feels safe in her relationships.    Past Medical History:  Diagnosis Date  . Allergic rhinitis   . Back pain    Dr. Karin Lieu  ([facility])  . Basal cell carcinoma   . Cystocele   . 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 Depression   . Fibrocystic breast   . GERD (gastroesophageal reflux disease)   . History of echocardiogram 12/2012   Dr. Daiva Huge: EF55-60%, mild TR, trace pulmonic and MR, abnl left vent diastolic  . Hypertension   . Piriformis syndrome 05/2013  . Postmenopausal   . Psoriasis 2009  . Sciatica of left side 05/2013  . TIA (transient ischemic attack) 2000  . Urinary incontinence    Past Surgical History:  Procedure Laterality Date  . CHOLECYSTECTOMY  11/2001  . LUMBAR DISC SURGERY  04/2005   R L4-5  and L5-S1  . SKIN BIOPSY Left 1999   left hand bcc vs scc  . TONSILLECTOMY AND ADENOIDECTOMY     Family History  Problem Relation Age of Onset  . Rheum arthritis Mother   . Alcohol abuse Mother   . Heart disease Father 45       MI at 21; died  . Diabetes Father   . Dementia  Maternal Grandmother    Allergies as of 03/08/2019      Reactions   Sulfa Antibiotics       Medication List       Accurate as of March 08, 2019  8:41 AM. If you have any questions, ask your nurse or doctor.        aspirin EC 325 MG tablet Take 325 mg by mouth daily.   Debrox 6.5 % OTIC solution Generic drug: carbamide peroxide Place 5 drops into both ears 2 (two) times daily.   hydrochlorothiazide 50 MG tablet Commonly known as: HYDRODIURIL Take 1 tablet (50 mg total) by mouth daily.   losartan 100 MG tablet Commonly known as: COZAAR Take 1 tablet (100 mg total) by mouth daily.   meloxicam 15 MG tablet Commonly known as: MOBIC Take 1 tablet (15 mg total) by mouth daily.   MULTIVITAMIN ADULT PO Take 2 Doses by mouth.   NON FORMULARY   Omega 3 1000 MG Caps Take by mouth.   oxybutynin 10 MG 24 hr tablet Commonly known as: DITROPAN-XL TK 1 T PO D   triazolam 0.25 MG tablet Commonly known as: HALCION DO NOT TK UNTIL ADVISED BY DENTAL  STAFF       All past medical history, surgical history, allergies, family history, immunizations andmedications were updated in the EMR today and reviewed under the history and medication portions of their EMR.     ROS: Negative, with the exception of above mentioned in HPI   Objective:  BP 128/71 (BP Location: Right Arm, Patient Position: Sitting, Cuff Size: Normal)   Pulse 72   Temp 98.4 F (36.9 C) (Temporal)   Resp 17   Ht 5\' 4"  (1.626 m)   Wt 166 lb 2 oz (75.4 kg)   SpO2 97%   BMI 28.52 kg/m  Body mass index is 28.52 kg/m. Gen: Afebrile. No acute distress.  HENT: AT. [place]. Bilateral TM visualized and normal in appearance. EAC without erythema or exudate material- there is minimal cerumen present. Eyes:Pupils Equal Round Reactive to light, Extraocular movements intact,  Conjunctiva without redness, discharge or icterus.  No exam data present No results found. No results found for this or any previous visit (from the past 24 hour(s)).  Assessment/Plan: [name] is a 68 y.o. female present for OV for  Ear pain, bilateral/decreased hearing.  EAC is normal. Recheck to day to be sure no infection after lavage and TM well healed. TM is normal and intact bilaterally Hearing has improved.  Use debrox every 2-4 weeks to maintain and prevent cerumen buildup.  - f/u PRN    Reviewed expectations re: course of current medical issues.  Discussed self-management of symptoms.  Outlined signs and symptoms indicating need for more acute intervention.  Patient verbalized understanding and all questions were answered.  Patient received an After-Visit Summary.   > 15 minutes spent with patient, > 50% of that time face to face    No orders of the defined types were placed in this encounter.    Note is dictated utilizing voice recognition software. Although note has been proof read prior to signing, occasional typographical errors still can be missed. If any questions arise,  please do not hesitate to call for verification.   electronically signed by:  Howard Pouch, DO  [place]

## 2019-04-15 DIAGNOSIS — H52201 Unspecified astigmatism, right eye: Secondary | ICD-10-CM | POA: Diagnosis not present

## 2019-04-15 DIAGNOSIS — H43813 Vitreous degeneration, bilateral: Secondary | ICD-10-CM | POA: Diagnosis not present

## 2019-04-15 DIAGNOSIS — H527 Unspecified disorder of refraction: Secondary | ICD-10-CM | POA: Diagnosis not present

## 2019-04-15 DIAGNOSIS — H25813 Combined forms of age-related cataract, bilateral: Secondary | ICD-10-CM | POA: Diagnosis not present

## 2019-04-15 DIAGNOSIS — H5052 Exophoria: Secondary | ICD-10-CM | POA: Diagnosis not present

## 2019-04-15 DIAGNOSIS — H353131 Nonexudative age-related macular degeneration, bilateral, early dry stage: Secondary | ICD-10-CM | POA: Diagnosis not present

## 2019-04-18 DIAGNOSIS — Z01818 Encounter for other preprocedural examination: Secondary | ICD-10-CM | POA: Diagnosis not present

## 2019-04-22 DIAGNOSIS — Z791 Long term (current) use of non-steroidal anti-inflammatories (NSAID): Secondary | ICD-10-CM | POA: Diagnosis not present

## 2019-04-22 DIAGNOSIS — H25813 Combined forms of age-related cataract, bilateral: Secondary | ICD-10-CM | POA: Diagnosis not present

## 2019-04-22 DIAGNOSIS — H5052 Exophoria: Secondary | ICD-10-CM | POA: Diagnosis not present

## 2019-04-22 DIAGNOSIS — Z9889 Other specified postprocedural states: Secondary | ICD-10-CM | POA: Diagnosis not present

## 2019-04-22 DIAGNOSIS — H43813 Vitreous degeneration, bilateral: Secondary | ICD-10-CM | POA: Diagnosis not present

## 2019-04-22 DIAGNOSIS — Z79899 Other long term (current) drug therapy: Secondary | ICD-10-CM | POA: Diagnosis not present

## 2019-04-22 DIAGNOSIS — I10 Essential (primary) hypertension: Secondary | ICD-10-CM | POA: Diagnosis not present

## 2019-04-22 DIAGNOSIS — F17200 Nicotine dependence, unspecified, uncomplicated: Secondary | ICD-10-CM | POA: Diagnosis not present

## 2019-04-22 DIAGNOSIS — H25812 Combined forms of age-related cataract, left eye: Secondary | ICD-10-CM | POA: Diagnosis not present

## 2019-04-22 DIAGNOSIS — H52201 Unspecified astigmatism, right eye: Secondary | ICD-10-CM | POA: Diagnosis not present

## 2019-04-22 DIAGNOSIS — Z8673 Personal history of transient ischemic attack (TIA), and cerebral infarction without residual deficits: Secondary | ICD-10-CM | POA: Diagnosis not present

## 2019-04-22 DIAGNOSIS — Z882 Allergy status to sulfonamides status: Secondary | ICD-10-CM | POA: Diagnosis not present

## 2019-04-23 DIAGNOSIS — H25811 Combined forms of age-related cataract, right eye: Secondary | ICD-10-CM | POA: Diagnosis not present

## 2019-04-23 DIAGNOSIS — H527 Unspecified disorder of refraction: Secondary | ICD-10-CM | POA: Diagnosis not present

## 2019-04-23 DIAGNOSIS — H43813 Vitreous degeneration, bilateral: Secondary | ICD-10-CM | POA: Diagnosis not present

## 2019-04-23 DIAGNOSIS — H52201 Unspecified astigmatism, right eye: Secondary | ICD-10-CM | POA: Diagnosis not present

## 2019-04-23 DIAGNOSIS — H353131 Nonexudative age-related macular degeneration, bilateral, early dry stage: Secondary | ICD-10-CM | POA: Diagnosis not present

## 2019-04-23 DIAGNOSIS — Z961 Presence of intraocular lens: Secondary | ICD-10-CM | POA: Diagnosis not present

## 2019-04-23 DIAGNOSIS — H5052 Exophoria: Secondary | ICD-10-CM | POA: Diagnosis not present

## 2019-05-31 DIAGNOSIS — Z01818 Encounter for other preprocedural examination: Secondary | ICD-10-CM | POA: Diagnosis not present

## 2019-06-03 DIAGNOSIS — H25811 Combined forms of age-related cataract, right eye: Secondary | ICD-10-CM

## 2019-06-03 DIAGNOSIS — Z8673 Personal history of transient ischemic attack (TIA), and cerebral infarction without residual deficits: Secondary | ICD-10-CM | POA: Diagnosis not present

## 2019-06-03 DIAGNOSIS — Z79899 Other long term (current) drug therapy: Secondary | ICD-10-CM | POA: Diagnosis not present

## 2019-06-03 DIAGNOSIS — H52221 Regular astigmatism, right eye: Secondary | ICD-10-CM

## 2019-06-03 DIAGNOSIS — Z882 Allergy status to sulfonamides status: Secondary | ICD-10-CM | POA: Diagnosis not present

## 2019-06-03 DIAGNOSIS — H52201 Unspecified astigmatism, right eye: Secondary | ICD-10-CM | POA: Diagnosis not present

## 2019-06-03 DIAGNOSIS — F17200 Nicotine dependence, unspecified, uncomplicated: Secondary | ICD-10-CM | POA: Diagnosis not present

## 2019-06-03 DIAGNOSIS — M1990 Unspecified osteoarthritis, unspecified site: Secondary | ICD-10-CM | POA: Diagnosis not present

## 2019-06-03 DIAGNOSIS — I10 Essential (primary) hypertension: Secondary | ICD-10-CM | POA: Diagnosis not present

## 2019-06-03 HISTORY — DX: Regular astigmatism, right eye: H52.221

## 2019-06-03 HISTORY — DX: Combined forms of age-related cataract, right eye: H25.811

## 2019-06-04 DIAGNOSIS — Z961 Presence of intraocular lens: Secondary | ICD-10-CM | POA: Diagnosis not present

## 2019-06-04 DIAGNOSIS — H43813 Vitreous degeneration, bilateral: Secondary | ICD-10-CM | POA: Diagnosis not present

## 2019-06-04 DIAGNOSIS — H527 Unspecified disorder of refraction: Secondary | ICD-10-CM | POA: Diagnosis not present

## 2019-06-04 DIAGNOSIS — H353131 Nonexudative age-related macular degeneration, bilateral, early dry stage: Secondary | ICD-10-CM | POA: Diagnosis not present

## 2019-06-04 DIAGNOSIS — H5052 Exophoria: Secondary | ICD-10-CM | POA: Diagnosis not present

## 2019-08-18 ENCOUNTER — Other Ambulatory Visit: Payer: Self-pay | Admitting: Family Medicine

## 2019-08-18 DIAGNOSIS — I10 Essential (primary) hypertension: Secondary | ICD-10-CM

## 2019-08-20 ENCOUNTER — Telehealth: Payer: Self-pay

## 2019-08-20 DIAGNOSIS — I10 Essential (primary) hypertension: Secondary | ICD-10-CM

## 2019-08-20 MED ORDER — LOSARTAN POTASSIUM 100 MG PO TABS: 100.0000 mg | ORAL_TABLET | Freq: Every day | ORAL | 0 refills | Status: DC

## 2019-08-20 MED ORDER — HYDROCHLOROTHIAZIDE 50 MG PO TABS: 50.0000 mg | ORAL_TABLET | Freq: Every day | ORAL | 0 refills | Status: DC

## 2019-08-20 NOTE — Telephone Encounter (Signed)
Pt was called and told she would need appt as she has not been seen since 01/2019. She scheduled appt and short supply was sent to the pharmacy so pt does not run out of medication

## 2019-08-20 NOTE — Telephone Encounter (Signed)
Patient refill request.  Second request.   hydrochlorothiazide (HYDRODIURIL) 50 MG tablet [id number]    [facility] - [address]

## 2019-08-23 ENCOUNTER — Ambulatory Visit (INDEPENDENT_AMBULATORY_CARE_PROVIDER_SITE_OTHER): Payer: Medicare Other | Admitting: Family Medicine

## 2019-08-23 ENCOUNTER — Encounter: Payer: Self-pay | Admitting: Family Medicine

## 2019-08-23 ENCOUNTER — Telehealth: Payer: Self-pay

## 2019-08-23 VITALS — Ht 64.0 in | Wt 152.0 lb

## 2019-08-23 DIAGNOSIS — T85848A Pain due to other internal prosthetic devices, implants and grafts, initial encounter: Secondary | ICD-10-CM

## 2019-08-23 DIAGNOSIS — R591 Generalized enlarged lymph nodes: Secondary | ICD-10-CM

## 2019-08-23 MED ORDER — AMOXICILLIN-POT CLAVULANATE 875-125 MG PO TABS: 1.0000 | ORAL_TABLET | Freq: Two times a day (BID) | ORAL | 0 refills | Status: DC

## 2019-08-23 MED ORDER — PREDNISONE 20 MG PO TABS: 40.0000 mg | ORAL_TABLET | Freq: Every day | ORAL | 0 refills | Status: DC

## 2019-08-23 NOTE — Telephone Encounter (Signed)
Patient reports the area above her lip is swollen and also her glands are swollen. She is requesting an appt today with Dr. Raoul Pitch.  Do we work her in as an acute appt?  Please advise.  Patient can be contacted at [phone].

## 2019-08-23 NOTE — Progress Notes (Signed)
VIRTUAL VISIT VIA VIDEO  I connected with [name] on 08/23/19 at 10:15 AM EST by a video enabled telemedicine application and verified that I am speaking with the correct person using two identifiers. Location patient: Home Location provider: [facility], Office Persons participating in the virtual visit: Patient, Dr. Raoul Pitch and R.Baker, LPN  I discussed the limitations of evaluation and management by telemedicine and the availability of in person appointments. The patient expressed understanding and agreed to proceed.   SUBJECTIVE Chief Complaint  Patient presents with  . Adenopathy    Pt had COVID vaccine about a week ago. Has swollen glands in neck and swollen around mouth and chin    HPI: [name] is a 69 y.o. female present for acute concern.  Patient reports anterior upper gum tenderness that started approximately 2 to 3 days ago.  Today she woke up and noticed left-sided submandibular lymphadenopathy that is also tender to palpation.  She has been taking Mucinex DM had a mild cough and runny nose which has subsided.  He denies any fever, chills, nausea or vomiting.  She denies any internal mouth swelling/tongue swelling or throat swelling.  She denies any shortness of breath.  She states she feels the pain is more up in her gumline/bone.  She does have extensive bridge work along this location as well as a dental implant on her left canine.  She also has concerns of having the Moderna  Covid vaccine approximately 1 week ago.  Patient has not had any injectable/fillers of her lips or face cosmetically.  ROS: See pertinent positives and negatives per HPI.  Patient Active Problem List   Diagnosis Date Noted  . Overweight (BMI 25.0-29.9) 02/10/2019  . Bilateral impacted cerumen 02/10/2019  . Tinnitus of left ear 02/10/2019  . Arthritis 08/20/2018  . Essential hypertension, benign 01/11/2016  . History of transient ischemic attack 01/11/2016  . Mixed  hyperlipidemia 01/11/2016    Social History   Tobacco Use  . Smoking status: Former Smoker    Types: Cigarettes  . Smokeless tobacco: Never Used  Substance Use Topics  . Alcohol use: No    Alcohol/week: 0.0 standard drinks    Current Outpatient Medications:  .  aspirin EC 325 MG tablet, Take 325 mg by mouth daily., Disp: , Rfl:  .  carbamide peroxide (DEBROX) 6.5 % OTIC solution, Place 5 drops into both ears 2 (two) times daily., Disp: 15 mL, Rfl: 0 .  hydrochlorothiazide (HYDRODIURIL) 50 MG tablet, Take 1 tablet (50 mg total) by mouth daily., Disp: 14 tablet, Rfl: 0 .  losartan (COZAAR) 100 MG tablet, Take 1 tablet (100 mg total) by mouth daily., Disp: 14 tablet, Rfl: 0 .  meloxicam (MOBIC) 15 MG tablet, Take 1 tablet (15 mg total) by mouth daily., Disp: 90 tablet, Rfl: 3 .  Multiple Vitamins-Minerals (MULTIVITAMIN ADULT PO), Take 2 Doses by mouth., Disp: , Rfl:  .  NON FORMULARY, , Disp: , Rfl:  .  Omega 3 1000 MG CAPS, Take by mouth., Disp: , Rfl:  .  oxybutynin (DITROPAN-XL) 10 MG 24 hr tablet, TK 1 T PO D, Disp: , Rfl:  .  triazolam (HALCION) 0.25 MG tablet, DO NOT TK UNTIL ADVISED BY DENTAL STAFF, Disp: , Rfl:   Allergies  Allergen Reactions  . Sulfa Antibiotics     OBJECTIVE: There were no vitals taken for this visit. Gen: No acute distress. Nontoxic in appearance.  HENT: AT. [place].  MMM.  Mild left  submandibular lymphadenopathy.  Patient reports tenderness with palpation.  Mild tenderness gumline above left canine tooth.  No obvious swelling of lips or face. Eyes:Pupils Equal Round Reactive to light, Extraocular movements intact,  Conjunctiva without redness, discharge or icterus. Chest: Cough or shortness of breath not present.  Skin: no rashes, purpura or petechiae.  Neuro:  Normal gait. Alert. Oriented x3  Psych: Normal affect, dress and demeanor. Normal speech. Normal thought content and judgment.  ASSESSMENT AND PLAN: [name] is a 69 y.o. female present for    Dental implant pain, initial encounter/Lymphadenopathy Uncertain exact cause of her symptoms, however this does not sound like a reaction to the Covid vaccine.  The area she is having pain is located over a dental implant.  She may have an infection surrounding her implant causing lymphadenopathy. There is no obvious swelling or allergic reaction by available exam today. Discussed starting Augmentin and short course of prednisone, and having her follow-up with her dental team.  She is agreeable to this today and already has an appointment set up in a couple days with her dental team.   No orders of the defined types were placed in this encounter.  Meds ordered this encounter  Medications  . amoxicillin-clavulanate (AUGMENTIN) 875-125 MG tablet    Sig: Take 1 tablet by mouth 2 (two) times daily.    Dispense:  14 tablet    Refill:  0  . predniSONE (DELTASONE) 20 MG tablet    Sig: Take 2 tablets (40 mg total) by mouth daily with breakfast.    Dispense:  10 tablet    Refill:  0     Prezley Qadir, DO 08/23/2019

## 2019-08-23 NOTE — Telephone Encounter (Signed)
Pt states area above lip is painful and swollen. No tongue or mouth swelling. No trouble swallowing or breathing. Had COVID vaccine a week ago. Has slight cough. Denies fever. Scheduled for acute visit @ 1015. Pt agreed with plan.

## 2019-08-26 ENCOUNTER — Ambulatory Visit (INDEPENDENT_AMBULATORY_CARE_PROVIDER_SITE_OTHER): Payer: Medicare Other | Admitting: Family Medicine

## 2019-08-26 ENCOUNTER — Other Ambulatory Visit: Payer: Self-pay

## 2019-08-26 ENCOUNTER — Encounter: Payer: Self-pay | Admitting: Family Medicine

## 2019-08-26 VITALS — BP 137/80 | HR 76 | Temp 97.2°F | Resp 17 | Ht 64.0 in | Wt 151.5 lb

## 2019-08-26 DIAGNOSIS — E663 Overweight: Secondary | ICD-10-CM | POA: Diagnosis not present

## 2019-08-26 DIAGNOSIS — M1990 Unspecified osteoarthritis, unspecified site: Secondary | ICD-10-CM | POA: Diagnosis not present

## 2019-08-26 DIAGNOSIS — I10 Essential (primary) hypertension: Secondary | ICD-10-CM | POA: Diagnosis not present

## 2019-08-26 DIAGNOSIS — E782 Mixed hyperlipidemia: Secondary | ICD-10-CM

## 2019-08-26 MED ORDER — MELOXICAM 15 MG PO TABS: 15.0000 mg | ORAL_TABLET | Freq: Every day | ORAL | 3 refills | Status: DC

## 2019-08-26 MED ORDER — LOSARTAN POTASSIUM 100 MG PO TABS: 100.0000 mg | ORAL_TABLET | Freq: Every day | ORAL | 1 refills | Status: DC

## 2019-08-26 MED ORDER — HYDROCHLOROTHIAZIDE 50 MG PO TABS: 50.0000 mg | ORAL_TABLET | Freq: Every day | ORAL | 1 refills | Status: DC

## 2019-08-26 NOTE — Progress Notes (Signed)
/   [name] , [birth year], 69 y.o., female MRN: [id number] Patient Care Team    Relationship Specialty Notifications Start End  [name], DO PCP - General Family Medicine  01/04/16   [name], DPM Consulting Physician Podiatry  01/12/16     Chief Complaint  Patient presents with  . Hypertension    Pt is doing well with no complaints. Does not take BP at home. Took BP meds around 7am. Needs refills on all medications   . Hyperlipidemia    Subjective: Pt presents for routine OV follow up on hypertension.  Hypertension/morbid obesity/hyperlipidemia: Pt reports compliance with Cozaar 100  QD and HCTZ 50 qd. Blood pressures ranges at home not checked.Patient denies chest pain, shortness of breath, dizziness or lower extremity edema.  Pt takes daily ASA. Pt is not prescribed statin. Labs UTD 01/2019 Diet:Pt  Has been eating well now.  Exercise: routine exercise.  RF: Hypertension, history of TIA, smoker, family history of heart disease  Arthritis: Patient reports she has been using the meloxicam daily and it has been working very well.  She does request refills today.   Allergies  Allergen Reactions  . Sulfa Antibiotics    Social History   Tobacco Use  . Smoking status: Former Smoker    Types: Cigarettes  . Smokeless tobacco: Never Used  Substance Use Topics  . Alcohol use: No    Alcohol/week: 0.0 standard drinks   Past Medical History:  Diagnosis Date  . Allergic rhinitis   . Astigmatism    right eye  . Back pain    Dr. Karin Lieu ([facility])  . Basal cell carcinoma   . Combined form of age-related cataract, both eyes   . Cystocele   . 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 Depression   . Disorder of refraction    both eyes  . Early stage nonexudative age-related macular degeneration of both eyes   . Exophoria   . Fibrocystic breast   . GERD (gastroesophageal reflux disease)   . History  of echocardiogram 12/2012   Dr. Daiva Huge: EF55-60%, mild TR, trace pulmonic and MR, abnl left vent diastolic  . Hypertension   . Nonexudative age-related macular degeneration, bilateral, early dry stage   . Piriformis syndrome 05/2013  . Posterior vitreous detachment, both eyes   . Postmenopausal   . Pseudophakia of left eye   . Psoriasis 2009  . Sciatica of left side 05/2013  . TIA (transient ischemic attack) 2000  . Urinary incontinence    Past Surgical History:  Procedure Laterality Date  . CHOLECYSTECTOMY  11/2001  . LUMBAR DISC SURGERY  04/2005   R L4-5  and L5-S1  . SKIN BIOPSY Left 1999   left hand bcc vs scc  . TONSILLECTOMY AND ADENOIDECTOMY     Family History  Problem Relation Age of Onset  . Rheum arthritis Mother   . Alcohol abuse Mother   . Heart disease Father 37       MI at 35; died  . Diabetes Father   . Dementia Maternal Grandmother    Allergies as of 08/26/2019      Reactions   Sulfa Antibiotics       Medication List       Accurate as of August 26, 2019  2:46 PM. If you have any questions, ask your nurse or doctor.        STOP taking these medications   amoxicillin-clavulanate  875-125 MG tablet Commonly known as: AUGMENTIN Stopped by: Howard Pouch, DO   predniSONE 20 MG tablet Commonly known as: DELTASONE Stopped by: Howard Pouch, DO     TAKE these medications   aspirin EC 325 MG tablet Take 325 mg by mouth daily.   hydrochlorothiazide 50 MG tablet Commonly known as: HYDRODIURIL Take 1 tablet (50 mg total) by mouth daily.   losartan 100 MG tablet Commonly known as: COZAAR Take 1 tablet (100 mg total) by mouth daily.   meloxicam 15 MG tablet Commonly known as: MOBIC Take 1 tablet (15 mg total) by mouth daily.   MULTIVITAMIN ADULT PO Take 2 Doses by mouth.   NON FORMULARY   Omega 3 1000 MG Caps Take by mouth.   oxybutynin 10 MG 24 hr tablet Commonly known as: DITROPAN-XL TK 1 T PO D       No results found for this or any  previous visit (from the past 24 hour(s)). No results found.   ROS: Negative, with the exception of above mentioned in HPI  Objective:  BP 137/80 (BP Location: Right Arm, Patient Position: Sitting, Cuff Size: Normal)   Pulse 76   Temp (!) 97.2 F (36.2 C) (Temporal)   Resp 17   Ht 5\' 4"  (1.626 m)   Wt 151 lb 8 oz (68.7 kg)   SpO2 97%   BMI 26.00 kg/m  Body mass index is 26 kg/m. Gen: Afebrile. No acute distress.  Nontoxic in appearance, pleasant, Caucasian female. HENT: AT. [place]. Eyes:Pupils Equal Round Reactive to light, Extraocular movements intact,  Conjunctiva without redness, discharge or icterus. Neck/lymp/endocrine: Supple, no thyromegaly CV: RRR no murmur, no edema, +2/4 P posterior tibialis pulses Chest: CTAB, no wheeze or crackles Skin: No rashes, purpura or petechiae.  Neuro:  Normal gait. PERLA. EOMi. Alert. Oriented x3 Psych: Normal affect, dress and demeanor. Normal speech. Normal thought content and judgment.   Assessment/Plan: [name] is a 68 y.o. female present for OV for  Essential hypertension, benign/morbid obesity/hyperlipidemia/history of TIA Stable .  She has been doing a great job with her weight loss.  She is down 44 pounds from this time last year. Continue losartan 100 mg daily and HCTZ 50 mg daily. -Labs are due on next appointment, she was told to come fasting for that appointment. - Continue ASA 325 - low sodium, exercise.  - Follow-up 6 months  Arthritis:  -Stable.   -Continue Mobic 15 mg daily.   .  Return in about 5 months (around 02/09/2020) for [place] (30 min). No orders of the defined types were placed in this encounter.  Meds ordered this encounter  Medications  . meloxicam (MOBIC) 15 MG tablet    Sig: Take 1 tablet (15 mg total) by mouth daily.    Dispense:  90 tablet    Refill:  3  . losartan (COZAAR) 100 MG tablet    Sig: Take 1 tablet (100 mg total) by mouth daily.    Dispense:  90 tablet    Refill:  1  .  hydrochlorothiazide (HYDRODIURIL) 50 MG tablet    Sig: Take 1 tablet (50 mg total) by mouth daily.    Dispense:  90 tablet    Refill:  1    Remind pt to take 1 only. Dose is now 50 mg per tab.   Referral Orders  No referral(s) requested today       electronically signed by:  Howard Pouch, DO  [place]

## 2019-08-26 NOTE — Patient Instructions (Signed)
You look great! I have refilled your meds for you.  Follow up last week in July>> fasting labs will be collected today.

## 2019-09-03 ENCOUNTER — Other Ambulatory Visit: Payer: Self-pay | Admitting: Family Medicine

## 2019-09-03 DIAGNOSIS — I10 Essential (primary) hypertension: Secondary | ICD-10-CM

## 2019-09-24 DIAGNOSIS — N3946 Mixed incontinence: Secondary | ICD-10-CM | POA: Diagnosis not present

## 2019-09-24 DIAGNOSIS — R350 Frequency of micturition: Secondary | ICD-10-CM | POA: Diagnosis not present

## 2019-11-16 ENCOUNTER — Ambulatory Visit (INDEPENDENT_AMBULATORY_CARE_PROVIDER_SITE_OTHER): Payer: Medicare Other | Admitting: Family Medicine

## 2019-11-16 ENCOUNTER — Encounter: Payer: Self-pay | Admitting: Family Medicine

## 2019-11-16 ENCOUNTER — Other Ambulatory Visit: Payer: Self-pay

## 2019-11-16 VITALS — BP 110/74 | HR 89 | Temp 98.3°F | Resp 18 | Ht 64.0 in | Wt 148.1 lb

## 2019-11-16 DIAGNOSIS — H6123 Impacted cerumen, bilateral: Secondary | ICD-10-CM | POA: Diagnosis not present

## 2019-11-16 DIAGNOSIS — H60503 Unspecified acute noninfective otitis externa, bilateral: Secondary | ICD-10-CM | POA: Diagnosis not present

## 2019-11-16 MED ORDER — OFLOXACIN 0.3 % OT SOLN: 10.0000 [drp] | Freq: Every day | OTIC | 0 refills | Status: AC

## 2019-11-16 NOTE — Progress Notes (Signed)
[name] , [birth year], 69 y.o., female MRN: [id number] Patient Care Team    Relationship Specialty Notifications Start End  [name], DO PCP - General Family Medicine  01/04/16   [name], DPM Consulting Physician Podiatry  01/12/16     Chief Complaint  Patient presents with  . Tinnitus    Ringing in both ears. Has been using drops in ear from ear cleaning kit, 1-2x week and sometimes more with no relief      Subjective: Pt presents for an OV for ear ringing and feelings of cerumen.  She reports symptoms are similar to her cerumen impaction in August. She has been using the debrox solution, but it has not been helping.  She has been to the beach a few times. She has not used q-tips any longer. She denies ear pain. She denies fever or chills.   Prior note 02/2019: She has been using debrox and she had a lavage of her ears completed 2 weeks ago. She has not used the debrox since lavage. Her symptoms have resolved, her hearing has been good- no muffle sounds. No pain or fever.  Prior note:  left ear pain and ringing of her ears. She was noted to have bilateral cerumen impaction at her CPE last week. She has been using the debrox flushes BID since that time. She has not had any cerumen return. She would like her ears cleaned today.    Depression screen [facility] 2/9 02/10/2019 02/09/2018 12/31/2017 01/08/2017 11/19/2016  Decreased Interest 0 0 0 2 0  Down, Depressed, Hopeless 0 0 0 3 0  PHQ - 2 Score 0 0 0 5 0  Altered sleeping - - - 0 -  Tired, decreased energy - - - 3 -  Change in appetite - - - 3 -  Feeling bad or failure about yourself  - - - 1 -  Trouble concentrating - - - 3 -  Moving slowly or fidgety/restless - - - 0 -  Suicidal thoughts - - - 0 -  PHQ-9 Score - - - 15 -    Allergies  Allergen Reactions  . Sulfa Antibiotics    Social History   Social History Narrative   Widower 2018 (D[place]). 2 sons.   College. Retired.    Former smoker (quit 04/2005- 30+  pack year), occasional etoh, no drugs   Drinks caffeine, uses herbal remedies, daily vitamin use   Wears her seatbelt, wears her bicycle helmet   Exercises routinely   Smoke detector in the home.    Firearms in the home (locked)   Feels safe in her relationships.    Past Medical History:  Diagnosis Date  . Allergic rhinitis   . Astigmatism    right eye  . Back pain    Dr. Karin Lieu ([facility])  . Basal cell carcinoma   . Combined form of age-related cataract, both eyes   . Cystocele   . 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 Depression   . Disorder of refraction    both eyes  . Early stage nonexudative age-related macular degeneration of both eyes   . Exophoria   . Fibrocystic breast   . GERD (gastroesophageal reflux disease)   . History of echocardiogram 12/2012   Dr. Daiva Huge: EF55-60%, mild TR, trace pulmonic and MR, abnl left vent diastolic  . Hypertension   . Nonexudative age-related macular degeneration, bilateral, early dry stage   .  Piriformis syndrome 05/2013  . Posterior vitreous detachment, both eyes   . Postmenopausal   . Pseudophakia of left eye   . Psoriasis 2009  . Sciatica of left side 05/2013  . TIA (transient ischemic attack) 2000  . Urinary incontinence    Past Surgical History:  Procedure Laterality Date  . CHOLECYSTECTOMY  11/2001  . LUMBAR DISC SURGERY  04/2005   R L4-5  and L5-S1  . SKIN BIOPSY Left 1999   left hand bcc vs scc  . TONSILLECTOMY AND ADENOIDECTOMY     Family History  Problem Relation Age of Onset  . Rheum arthritis Mother   . Alcohol abuse Mother   . Heart disease Father 14       MI at 2; died  . Diabetes Father   . Dementia Maternal Grandmother    Allergies as of 11/16/2019      Reactions   Sulfa Antibiotics       Medication List       Accurate as of Nov 16, 2019  3:37 PM. If you have any questions, ask your nurse or doctor.        aspirin EC 325 MG  tablet Take 325 mg by mouth daily.   Debrox 6.5 % OTIC solution Generic drug: carbamide peroxide Place 5 drops into both ears daily as needed.   hydrochlorothiazide 50 MG tablet Commonly known as: HYDRODIURIL Take 1 tablet (50 mg total) by mouth daily.   losartan 100 MG tablet Commonly known as: COZAAR Take 1 tablet (100 mg total) by mouth daily.   meloxicam 15 MG tablet Commonly known as: MOBIC Take 1 tablet (15 mg total) by mouth daily.   MULTIVITAMIN ADULT PO Take 2 Doses by mouth.   NON FORMULARY   Omega 3 1000 MG Caps Take by mouth.   oxybutynin 10 MG 24 hr tablet Commonly known as: DITROPAN-XL TK 1 T PO D       All past medical history, surgical history, allergies, family history, immunizations andmedications were updated in the EMR today and reviewed under the history and medication portions of their EMR.     ROS: Negative, with the exception of above mentioned in HPI   Objective:  BP 110/74 (BP Location: Right Arm, Patient Position: Sitting, Cuff Size: Normal)   Pulse 89   Temp 98.3 F (36.8 C) (Temporal)   Resp 18   Ht 5' 4"  (1.626 m)   Wt 148 lb 2 oz (67.2 kg)   SpO2 96%   BMI 25.43 kg/m  Body mass index is 25.43 kg/m. Gen: Afebrile. No acute distress.  HENT: AT. [place]. Bilateral TM visualized unable to be visualized 2/2 to cerumen and ? FB EAC left ear.  Neuro:  Normal gait. PERLA. EOMi. Alert. Oriented Psych: Normal affect, dress and demeanor. Normal speech. Normal thought content and judgment.   No exam data present No results found. No results found for this or any previous visit (from the past 24 hour(s)).  Assessment/Plan: [name] is a 69 y.o. female present for OV for  Cerumen/otitis externa Procedure: Cerumen disimpaction Water-peroxide solution was applied and gentle ear lavage performed on bilateral ear(s).  There were no complications.  Tympanic membrane was visualized after disimpaction.  Tympanic membrane(s) intact, without  erythema.  Auditory canal(s) with exudative material present.  Patient tolerated procedure well.  Patient reported relief of symptoms after removal of cerumen. Patient appears to have bilateral otitis externa. Ofloxacin eardrops prescribed for bilateral ears times 7 days Follow-up  2 weeks if symptoms persist would need to consider referral to ENT at that time.   Reviewed expectations re: course of current medical issues.  Discussed self-management of symptoms.  Outlined signs and symptoms indicating need for more acute intervention.  Patient verbalized understanding and all questions were answered.  Patient received an After-Visit Summary.      No orders of the defined types were placed in this encounter.  Meds ordered this encounter  Medications  . ofloxacin (FLOXIN OTIC) 0.3 % OTIC solution    Sig: Place 10 drops into both ears daily for 7 days.    Dispense:  5 mL    Refill:  0   Referral Orders  No referral(s) requested today      Note is dictated utilizing voice recognition software. Although note has been proof read prior to signing, occasional typographical errors still can be missed. If any questions arise, please do not hesitate to call for verification.   electronically signed by:  Howard Pouch, DO  [place]

## 2019-11-16 NOTE — Patient Instructions (Signed)
Use ear drops once daily-  10 drops each ear for 7 days.    Otitis Externa  Otitis externa is an infection of the outer ear canal. The outer ear canal is the area between the outside of the ear and the eardrum. Otitis externa is sometimes called swimmer's ear. What are the causes? Common causes of this condition include:  Swimming in dirty water.  Moisture in the ear.  An injury to the inside of the ear.  An object stuck in the ear.  A cut or scrape on the outside of the ear. What increases the risk? You are more likely to get this condition if you go swimming often. What are the signs or symptoms?  Itching in the ear. This is often the first symptom.  Swelling of the ear.  Redness in the ear.  Ear pain. The pain may get worse when you pull on your ear.  Pus coming from the ear. How is this treated? This condition may be treated with:  Antibiotic ear drops. These are often given for 10-14 days.  Medicines to reduce itching and swelling. Follow these instructions at home:  If you were given antibiotic ear drops, use them as told by your doctor. Do not stop using them even if your condition gets better.  Take over-the-counter and prescription medicines only as told by your doctor.  Avoid getting water in your ears as told by your doctor. You may be told to avoid swimming or water sports for a few days.  Keep all follow-up visits as told by your doctor. This is important. How is this prevented?  Keep your ears dry. Use the corner of a towel to dry your ears after you swim or bathe.  Try not to scratch or put things in your ear. Doing these things makes it easier for germs to grow in your ear.  Avoid swimming in lakes, dirty water, or pools that may not have the right amount of a chemical called chlorine. Contact a doctor if:  You have a fever.  Your ear is still red, swollen, or painful after 3 days.  You still have pus coming from your ear after 3  days.  Your redness, swelling, or pain gets worse.  You have a really bad headache.  You have redness, swelling, pain, or tenderness behind your ear. Summary  Otitis externa is an infection of the outer ear canal.  Symptoms include pain, redness, and swelling of the ear.  If you were given antibiotic ear drops, use them as told by your doctor. Do not stop using them even if your condition gets better.  Try not to scratch or put things in your ear. This information is not intended to replace advice given to you by your health care provider. Make sure you discuss any questions you have with your health care provider. Document Revised: 12/05/2017 Document Reviewed: 12/05/2017 Elsevier Patient Education  [place].

## 2020-02-10 ENCOUNTER — Ambulatory Visit (INDEPENDENT_AMBULATORY_CARE_PROVIDER_SITE_OTHER): Payer: Medicare Other | Admitting: Family Medicine

## 2020-02-10 ENCOUNTER — Telehealth (INDEPENDENT_AMBULATORY_CARE_PROVIDER_SITE_OTHER): Payer: Medicare Other | Admitting: Family Medicine

## 2020-02-10 ENCOUNTER — Telehealth: Payer: Self-pay

## 2020-02-10 ENCOUNTER — Other Ambulatory Visit: Payer: Self-pay

## 2020-02-10 DIAGNOSIS — R300 Dysuria: Secondary | ICD-10-CM | POA: Diagnosis not present

## 2020-02-10 LAB — POC URINALSYSI DIPSTICK (AUTOMATED)
Blood, UA: NEGATIVE
Glucose, UA: NEGATIVE
Nitrite, UA: POSITIVE
Protein, UA: POSITIVE — AB
Spec Grav, UA: 1.015 (ref 1.010–1.025)
Urobilinogen, UA: 2 E.U./dL — AB
pH, UA: 6.5 (ref 5.0–8.0)

## 2020-02-10 MED ORDER — NITROFURANTOIN MONOHYD MACRO 100 MG PO CAPS: 100.0000 mg | ORAL_CAPSULE | Freq: Two times a day (BID) | ORAL | 0 refills | Status: DC

## 2020-02-10 NOTE — Progress Notes (Signed)
Virtual Visit via Video Note  I connected with [name]  on 02/10/20 at  1:00 PM EDT by a video enabled telemedicine application and verified that I am speaking with the correct person using two identifiers.  Location patient: home, [place] Location provider:work or home office Persons participating in the virtual visit: patient, provider  I discussed the limitations of evaluation and management by telemedicine and the availability of in person appointments. The patient expressed understanding and agreed to proceed.   HPI:  Acute visit for Dysuria -started about 1 week ago -symptoms include frequency, urgency, dysuria -denies: fevers, NVD, hematuria, flank pain or abd pain -reports hx of UTI and feels similar -has follow up with PCP soon and was trying to "wait it out" -but, since was not getting better with OTC options did UA at PCP office today -on review of chart UA abnormal  ROS: See pertinent positives and negatives per HPI.  Past Medical History:  Diagnosis Date  . Allergic rhinitis   . Astigmatism    right eye  . Back pain    Dr. Karin Lieu ([facility])  . Basal cell carcinoma   . Combined form of age-related cataract, both eyes   . Cystocele   . 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 Depression   . Disorder of refraction    both eyes  . Early stage nonexudative age-related macular degeneration of both eyes   . Exophoria   . Fibrocystic breast   . GERD (gastroesophageal reflux disease)   . History of echocardiogram 12/2012   Dr. Daiva Huge: EF55-60%, mild TR, trace pulmonic and MR, abnl left vent diastolic  . Hypertension   . Nonexudative age-related macular degeneration, bilateral, early dry stage   . Piriformis syndrome 05/2013  . Posterior vitreous detachment, both eyes   . Postmenopausal   . Pseudophakia of left eye   . Psoriasis 2009  . Sciatica of left side 05/2013  . TIA (transient ischemic attack) 2000  .  Urinary incontinence     Past Surgical History:  Procedure Laterality Date  . CHOLECYSTECTOMY  11/2001  . LUMBAR DISC SURGERY  04/2005   R L4-5  and L5-S1  . SKIN BIOPSY Left 1999   left hand bcc vs scc  . TONSILLECTOMY AND ADENOIDECTOMY      Family History  Problem Relation Age of Onset  . Rheum arthritis Mother   . Alcohol abuse Mother   . Heart disease Father 36       MI at 49; died  . Diabetes Father   . Dementia Maternal Grandmother     SOCIAL HX: see hpi   Current Outpatient Medications:  .  aspirin EC 325 MG tablet, Take 325 mg by mouth daily., Disp: , Rfl:  .  carbamide peroxide (DEBROX) 6.5 % OTIC solution, Place 5 drops into both ears daily as needed., Disp: , Rfl:  .  hydrochlorothiazide (HYDRODIURIL) 50 MG tablet, Take 1 tablet (50 mg total) by mouth daily., Disp: 90 tablet, Rfl: 1 .  losartan (COZAAR) 100 MG tablet, Take 1 tablet (100 mg total) by mouth daily., Disp: 90 tablet, Rfl: 1 .  meloxicam (MOBIC) 15 MG tablet, Take 1 tablet (15 mg total) by mouth daily., Disp: 90 tablet, Rfl: 3 .  Multiple Vitamins-Minerals (MULTIVITAMIN ADULT PO), Take 2 Doses by mouth., Disp: , Rfl:  .  nitrofurantoin, macrocrystal-monohydrate, (MACROBID) 100 MG capsule, Take 1 capsule (100 mg total) by mouth 2 (two) times daily., Disp:  14 capsule, Rfl: 0 .  NON FORMULARY, , Disp: , Rfl:  .  Omega 3 1000 MG CAPS, Take by mouth., Disp: , Rfl:  .  oxybutynin (DITROPAN-XL) 10 MG 24 hr tablet, TK 1 T PO D, Disp: , Rfl:   EXAM:  VITALS per patient if applicable:  GENERAL: alert, oriented, appears well and in no acute distress  HEENT: atraumatic, conjunttiva clear, no obvious abnormalities on inspection of external nose and ears  NECK: normal movements of the head and neck  LUNGS: on inspection no signs of respiratory distress, breathing rate appears normal, no obvious gross SOB, gasping or wheezing  CV: no obvious cyanosis  MS: moves all visible extremities without noticeable  abnormality  PSYCH/NEURO: pleasant and cooperative, no obvious depression or anxiety, speech and thought processing grossly intact  ASSESSMENT AND PLAN:  Discussed the following assessment and plan:  Dysuria  -we discussed possible serious and likely etiologies, options for evaluation and workup, limitations of telemedicine visit vs in person visit, treatment, treatment risks and precautions. Pt prefers to treat via telemedicine empirically rather then risking or undertaking an in person visit at this moment. Opted for Macrobid 100mg  bid x 7 days for likely UTI. Advised to keep follow up with PCP as scheduled and to notify her of the abnormal urine studies and treatment. Patient also advised to seek prompt in person care sooner if worsening, new symptoms arise, or if is not improving with treatment.   I discussed the assessment and treatment plan with the patient. The patient was provided an opportunity to ask questions and all were answered. The patient agreed with the plan and demonstrated an understanding of the instructions.   The patient was advised to call back or seek an in-person evaluation if the symptoms worsen or if the condition fails to improve as anticipated.   Sandra Kern, DO

## 2020-02-10 NOTE — Telephone Encounter (Signed)
Has urinary track infection symptoms, frequent going to the bathroom, burning and itching, has been using otc meds and not helped. Needs to be treated by physician. Dr. Raoul Pitch has no openings today or tomorrow.  Possible other options with another provider, I will contact back if we can scheduled a virtual appt and have patient come here do her urine test.

## 2020-02-21 ENCOUNTER — Telehealth: Payer: Self-pay

## 2020-02-21 NOTE — Telephone Encounter (Signed)
Patient has finished her antibiotic. She is still having urinary frequency and burning with urination. Please advise.

## 2020-02-21 NOTE — Telephone Encounter (Signed)
Spoke with patient and she had an appt with Dr.Kim on 7/29 and given Macrobid x14 bid. Still having the same symptoms, advised PCP out of the office until Thursday and she already has [facility] appt for Friday 8/13.

## 2020-02-23 ENCOUNTER — Ambulatory Visit: Payer: Medicare Other | Admitting: Family Medicine

## 2020-02-25 ENCOUNTER — Encounter: Payer: Self-pay | Admitting: Family Medicine

## 2020-02-25 ENCOUNTER — Ambulatory Visit (INDEPENDENT_AMBULATORY_CARE_PROVIDER_SITE_OTHER): Payer: Medicare Other | Admitting: Family Medicine

## 2020-02-25 ENCOUNTER — Other Ambulatory Visit: Payer: Self-pay

## 2020-02-25 VITALS — BP 124/73 | HR 89 | Temp 97.7°F | Ht 64.0 in | Wt 144.8 lb

## 2020-02-25 DIAGNOSIS — M1990 Unspecified osteoarthritis, unspecified site: Secondary | ICD-10-CM

## 2020-02-25 DIAGNOSIS — R300 Dysuria: Secondary | ICD-10-CM

## 2020-02-25 DIAGNOSIS — E663 Overweight: Secondary | ICD-10-CM | POA: Diagnosis not present

## 2020-02-25 DIAGNOSIS — I10 Essential (primary) hypertension: Secondary | ICD-10-CM

## 2020-02-25 DIAGNOSIS — E782 Mixed hyperlipidemia: Secondary | ICD-10-CM

## 2020-02-25 LAB — LIPID PANEL
Cholesterol: 141 mg/dL (ref 0–200)
HDL: 36.7 mg/dL — ABNORMAL LOW (ref >39.00)
LDL Cholesterol: 79 mg/dL (ref 0–99)
NonHDL: 104.13
Total CHOL/HDL Ratio: 4
Triglycerides: 127 mg/dL (ref 0.0–149.0)
VLDL: 25.4 mg/dL (ref 0.0–40.0)

## 2020-02-25 LAB — CBC
HCT: 40.1 % (ref 36.0–46.0)
Hemoglobin: 14.1 g/dL (ref 12.0–15.0)
MCHC: 35.2 g/dL (ref 30.0–36.0)
MCV: 94.9 fl (ref 78.0–100.0)
Platelets: 160 10*3/uL (ref 150.0–400.0)
RBC: 4.22 Mil/uL (ref 3.87–5.11)
RDW: 12.8 % (ref 11.5–15.5)
WBC: 9.2 10*3/uL (ref 4.0–10.5)

## 2020-02-25 LAB — TSH: TSH: 2.1 u[IU]/mL (ref 0.35–4.50)

## 2020-02-25 LAB — COMPREHENSIVE METABOLIC PANEL
ALT: 12 U/L (ref 0–35)
AST: 16 U/L (ref 0–37)
Albumin: 4.2 g/dL (ref 3.5–5.2)
Alkaline Phosphatase: 62 U/L (ref 39–117)
BUN: 23 mg/dL (ref 6–23)
CO2: 29 mEq/L (ref 19–32)
Calcium: 9.8 mg/dL (ref 8.4–10.5)
Chloride: 100 mEq/L (ref 96–112)
Creatinine, Ser: 0.62 mg/dL (ref 0.40–1.20)
GFR: 94.99 mL/min (ref >60.00)
Glucose, Bld: 105 mg/dL — ABNORMAL HIGH (ref 70–99)
Potassium: 4.1 mEq/L (ref 3.5–5.1)
Sodium: 137 mEq/L (ref 135–145)
Total Bilirubin: 1.8 mg/dL — ABNORMAL HIGH (ref 0.2–1.2)
Total Protein: 6.4 g/dL (ref 6.0–8.3)

## 2020-02-25 LAB — POC URINALSYSI DIPSTICK (AUTOMATED)
Bilirubin, UA: NEGATIVE
Glucose, UA: NEGATIVE
Ketones, UA: NEGATIVE
Nitrite, UA: NEGATIVE
Protein, UA: NEGATIVE
Spec Grav, UA: 1.01 (ref 1.010–1.025)
Urobilinogen, UA: 0.2 E.U./dL
pH, UA: 7.5 (ref 5.0–8.0)

## 2020-02-25 MED ORDER — HYDROCHLOROTHIAZIDE 50 MG PO TABS: 50.0000 mg | ORAL_TABLET | Freq: Every day | ORAL | 1 refills | Status: DC

## 2020-02-25 MED ORDER — MELOXICAM 15 MG PO TABS: 15.0000 mg | ORAL_TABLET | Freq: Every day | ORAL | 3 refills | Status: DC

## 2020-02-25 MED ORDER — LOSARTAN POTASSIUM 100 MG PO TABS: 100.0000 mg | ORAL_TABLET | Freq: Every day | ORAL | 1 refills | Status: DC

## 2020-02-25 MED ORDER — CEPHALEXIN 500 MG PO CAPS: 500.0000 mg | ORAL_CAPSULE | Freq: Three times a day (TID) | ORAL | 0 refills | Status: DC

## 2020-02-25 NOTE — Patient Instructions (Signed)
You look great.  BMI/weight is now in the "normal" range at 24.   I have refilled your medications for you.  We will call you with lab results.   Have fun on your trip.

## 2020-02-25 NOTE — Progress Notes (Signed)
/   [name] , 06/20/1951, 69 y.o., female MRN: [id number] Patient Care Team    Relationship Specialty Notifications Start End  [name], DO PCP - General Family Medicine  01/04/16   [name], DPM Consulting Physician Podiatry  01/12/16     Chief Complaint  Patient presents with  . Other    possible UTI  . Hypertension    cmc    Subjective: Pt presents for routine OV follow up on hypertension.  Hypertension/morbid obesity/hyperlipidemia: Pt reports compliance with Cozaar 100  QD and HCTZ 50 qd. Blood pressures ranges at home not checked.Patient denies chest pain, shortness of breath, dizziness or lower extremity edema.  Pt takes daily ASA. Pt is not prescribed statin. Labs due Exercise: routine exercise.  RF: Hypertension, history of TIA, smoker, family history of heart disease  Arthritis: Patient reports compliance with  meloxicam and it has been working well.  She denies any swelling, bruising or bleeding.  Dysuria: Been "awhile" at least 3 weeks. Was seen virtually and prescribed Macrobid. She states that improved her symptoms but she is still have urinary frequency and burning with urination. She has been taking azo until yesterday.    Depression screen [facility] 2/9 02/25/2020 02/10/2019 02/09/2018 12/31/2017 01/08/2017  Decreased Interest 0 0 0 0 2  Down, Depressed, Hopeless 0 0 0 0 3  PHQ - 2 Score 0 0 0 0 5  Altered sleeping - - - - 0  Tired, decreased energy - - - - 3  Change in appetite - - - - 3  Feeling bad or failure about yourself  - - - - 1  Trouble concentrating - - - - 3  Moving slowly or fidgety/restless - - - - 0  Suicidal thoughts - - - - 0  PHQ-9 Score - - - - 15   GAD 7 : Generalized Anxiety Score 01/08/2017  Nervous, Anxious, on Edge 2  Control/stop worrying 2  Worry too much - different things 0  Trouble relaxing 0  Restless 0  Easily annoyed or irritable 0  Afraid - awful might happen 0  Total GAD 7 Score 4  Anxiety Difficulty Very  difficult    Allergies  Allergen Reactions  . Sulfa Antibiotics    Social History   Tobacco Use  . Smoking status: Current Every Day Smoker    Packs/day: 1.00    Years: 3.00    Pack years: 3.00    Types: Cigarettes  . Smokeless tobacco: Never Used  Substance Use Topics  . Alcohol use: No    Alcohol/week: 0.0 standard drinks   Past Medical History:  Diagnosis Date  . Allergic rhinitis   . Astigmatism    right eye  . Back pain    Dr. Karin Lieu ([facility])  . Basal cell carcinoma   . Combined form of age-related cataract, both eyes   . Cystocele   . 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 Depression   . Disorder of refraction    both eyes  . Early stage nonexudative age-related macular degeneration of both eyes   . Exophoria   . Fibrocystic breast   . GERD (gastroesophageal reflux disease)   . History of echocardiogram 12/2012   Dr. Daiva Huge: EF55-60%, mild TR, trace pulmonic and MR, abnl left vent diastolic  . Hypertension   . Nonexudative age-related macular degeneration, bilateral, early dry stage   . Piriformis syndrome 05/2013  . Posterior vitreous  detachment, both eyes   . Postmenopausal   . Pseudophakia of left eye   . Psoriasis 2009  . Sciatica of left side 05/2013  . TIA (transient ischemic attack) 2000  . Urinary incontinence    Past Surgical History:  Procedure Laterality Date  . CHOLECYSTECTOMY  11/2001  . LUMBAR DISC SURGERY  04/2005   R L4-5  and L5-S1  . SKIN BIOPSY Left 1999   left hand bcc vs scc  . TONSILLECTOMY AND ADENOIDECTOMY     Family History  Problem Relation Age of Onset  . Rheum arthritis Mother   . Alcohol abuse Mother   . Heart disease Father 73       MI at 28; died  . Diabetes Father   . Dementia Maternal Grandmother    Allergies as of 02/25/2020      Reactions   Sulfa Antibiotics       Medication List       Accurate as of February 25, 2020 11:05 AM. If you have any  questions, ask your nurse or doctor.        STOP taking these medications   nitrofurantoin (macrocrystal-monohydrate) 100 MG capsule Commonly known as: Macrobid Stopped by: Howard Pouch, DO     TAKE these medications   aspirin EC 325 MG tablet Take 325 mg by mouth daily.   cephALEXin 500 MG capsule Commonly known as: KEFLEX Take 1 capsule (500 mg total) by mouth 3 (three) times daily. Started by: Howard Pouch, DO   Debrox 6.5 % OTIC solution Generic drug: carbamide peroxide Place 5 drops into both ears daily as needed.   hydrochlorothiazide 50 MG tablet Commonly known as: HYDRODIURIL Take 1 tablet (50 mg total) by mouth daily.   losartan 100 MG tablet Commonly known as: COZAAR Take 1 tablet (100 mg total) by mouth daily.   meloxicam 15 MG tablet Commonly known as: MOBIC Take 1 tablet (15 mg total) by mouth daily.   MULTIVITAMIN ADULT PO Take 2 Doses by mouth.   NON FORMULARY   Omega 3 1000 MG Caps Take by mouth.   oxybutynin 10 MG 24 hr tablet Commonly known as: DITROPAN-XL TK 1 T PO D       Results for orders placed or performed in visit on 02/25/20 (from the past 24 hour(s))  POCT Urinalysis Dipstick (Automated)     Status: Abnormal   Collection Time: 02/25/20 10:42 AM  Result Value Ref Range   Color, UA yellow    Clarity, UA cloudy    Glucose, UA Negative Negative   Bilirubin, UA negative    Ketones, UA negative    Spec Grav, UA 1.010 1.010 - 1.025   Blood, UA 3+    pH, UA 7.5 5.0 - 8.0   Protein, UA Negative Negative   Urobilinogen, UA 0.2 0.2 or 1.0 E.U./dL   Nitrite, UA negative    Leukocytes, UA Moderate (2+) (A) Negative   No results found.   ROS: Negative, with the exception of above mentioned in HPI  Objective:  BP 124/73 (BP Location: Right Arm, Patient Position: Sitting, Cuff Size: Normal)   Pulse 89   Temp 97.7 F (36.5 C) (Oral)   Ht 5' 4"  (1.626 m)   Wt 144 lb 12.8 oz (65.7 kg)   SpO2 95%   BMI 24.85 kg/m  Body mass  index is 24.85 kg/m. Gen: Afebrile. No acute distress. Nontoxic.  HENT: AT. [place].  Eyes:Pupils Equal Round Reactive to light, Extraocular movements intact,  Conjunctiva without redness, discharge or icterus. Neck/lymp/endocrine: Supple,no lymphadenopathy, no thyromegaly CV: RRR no murmur, no edema, +2/4 P posterior tibialis pulses Chest: CTAB, no wheeze or crackles Abd: Soft. NTND. BS present. no Masses palpated.  MSK: no cva tenderness Neuro:  Normal gait. PERLA. EOMi. Alert. Oriented x3  Psych: Normal affect, dress and demeanor. Normal speech. Normal thought content and judgment.  Results for orders placed or performed in visit on 02/25/20 (from the past 24 hour(s))  POCT Urinalysis Dipstick (Automated)     Status: Abnormal   Collection Time: 02/25/20 10:42 AM  Result Value Ref Range   Color, UA yellow    Clarity, UA cloudy    Glucose, UA Negative Negative   Bilirubin, UA negative    Ketones, UA negative    Spec Grav, UA 1.010 1.010 - 1.025   Blood, UA 3+    pH, UA 7.5 5.0 - 8.0   Protein, UA Negative Negative   Urobilinogen, UA 0.2 0.2 or 1.0 E.U./dL   Nitrite, UA negative    Leukocytes, UA Moderate (2+) (A) Negative    Assessment/Plan: [name] is a 69 y.o. female present for OV for  Essential hypertension, benign/morbid obesity/hyperlipidemia/history of TIA - stable.  - continue losartan 100  - Continue HCTZ 50 mg daily. - Continue ASA 325 - low sodium, exercise.  - Follow-up 5.5 months  Arthritis:  - stable - continue mobic  15 mg daily  - CBC - Comp Met (CMET) - TSH - Lipid panel  Dysuria Urine appears infectious today. Treat with keflex TID for 7 days.  - POCT Urinalysis Dipstick (Automated) - Urinalysis w microscopic + reflex cultur> Sent Pt will be called with results.    Return in about 6 months (around 08/15/2020) for [place] (30 min). Orders Placed This Encounter  Procedures  . Urinalysis w microscopic + reflex cultur  . CBC  . Comp Met (CMET)  .  TSH  . Lipid panel  . POCT Urinalysis Dipstick (Automated)   Meds ordered this encounter  Medications  . meloxicam (MOBIC) 15 MG tablet    Sig: Take 1 tablet (15 mg total) by mouth daily.    Dispense:  90 tablet    Refill:  3  . losartan (COZAAR) 100 MG tablet    Sig: Take 1 tablet (100 mg total) by mouth daily.    Dispense:  90 tablet    Refill:  1  . hydrochlorothiazide (HYDRODIURIL) 50 MG tablet    Sig: Take 1 tablet (50 mg total) by mouth daily.    Dispense:  90 tablet    Refill:  1    Remind pt to take 1 only. Dose is now 50 mg per tab.  . cephALEXin (KEFLEX) 500 MG capsule    Sig: Take 1 capsule (500 mg total) by mouth 3 (three) times daily.    Dispense:  21 capsule    Refill:  0      electronically signed by:  Howard Pouch, DO  [place]

## 2020-02-29 ENCOUNTER — Telehealth: Payer: Self-pay | Admitting: Family Medicine

## 2020-02-29 DIAGNOSIS — R17 Unspecified jaundice: Secondary | ICD-10-CM

## 2020-02-29 LAB — URINALYSIS W MICROSCOPIC + REFLEX CULTURE
Bilirubin Urine: NEGATIVE
Glucose, UA: NEGATIVE
Hyaline Cast: NONE SEEN /LPF
Ketones, ur: NEGATIVE
Nitrites, Initial: POSITIVE — AB
Protein, ur: NEGATIVE
Specific Gravity, Urine: 1.01 (ref 1.001–1.03)
Squamous Epithelial / HPF: NONE SEEN /HPF (ref <=5)
pH: >=8.5 — AB (ref 5.0–8.0)

## 2020-02-29 LAB — URINE CULTURE
MICRO NUMBER:: 10829180
SPECIMEN QUALITY:: ADEQUATE

## 2020-02-29 LAB — CULTURE INDICATED

## 2020-02-29 NOTE — Telephone Encounter (Signed)
Please inform patient: Her electrolytes and kidney function are normal. Cholesterol panel is at goal Thyroid levels are normal Blood cell counts are normal She did have a urinary tract infection and the antibiotic prescribed will treat effectively> take as directed until completed.  She did have a elevation in her bilirubin which is part of the liver function test.  This has not occurred for her in the past.  I would recommend we repeat this for verification in 2 weeks by lab appointment only.  She should make sure she does eat before the lab this time.

## 2020-02-29 NOTE — Telephone Encounter (Signed)
Pt understood lab results and will call to schedule lab appt.

## 2020-03-15 ENCOUNTER — Other Ambulatory Visit: Payer: Self-pay

## 2020-03-15 ENCOUNTER — Telehealth: Payer: Self-pay

## 2020-03-15 ENCOUNTER — Ambulatory Visit (INDEPENDENT_AMBULATORY_CARE_PROVIDER_SITE_OTHER): Payer: Medicare Other | Admitting: Family Medicine

## 2020-03-15 ENCOUNTER — Encounter: Payer: Self-pay | Admitting: Family Medicine

## 2020-03-15 VITALS — BP 140/80 | HR 71 | Temp 98.1°F | Ht 64.0 in | Wt 145.0 lb

## 2020-03-15 DIAGNOSIS — N390 Urinary tract infection, site not specified: Secondary | ICD-10-CM

## 2020-03-15 DIAGNOSIS — R350 Frequency of micturition: Secondary | ICD-10-CM | POA: Diagnosis not present

## 2020-03-15 LAB — POC URINALSYSI DIPSTICK (AUTOMATED)
Bilirubin, UA: NEGATIVE
Glucose, UA: NEGATIVE
Ketones, UA: NEGATIVE
Leukocytes, UA: NEGATIVE
Nitrite, UA: POSITIVE
Protein, UA: NEGATIVE
Spec Grav, UA: 1.01 (ref 1.010–1.025)
Urobilinogen, UA: 0.2 E.U./dL
pH, UA: 8 (ref 5.0–8.0)

## 2020-03-15 MED ORDER — CIPROFLOXACIN HCL 500 MG PO TABS: 500.0000 mg | ORAL_TABLET | Freq: Two times a day (BID) | ORAL | 0 refills | Status: DC

## 2020-03-15 NOTE — Patient Instructions (Signed)
Hydrate.  Start cipro every 12 hours for 5 days . We will call you with results.

## 2020-03-15 NOTE — Telephone Encounter (Signed)
Pt scheduled for 3:45 today

## 2020-03-15 NOTE — Progress Notes (Signed)
/   [name] , [birth year], 69 y.o., female MRN: [id number] Patient Care Team    Relationship Specialty Notifications Start End  [name], DO PCP - General Family Medicine  01/04/16   [name], DPM Consulting Physician Podiatry  01/12/16     Chief Complaint  Patient presents with  . Dysuria  . Urinary Frequency    Subjective: Pt presents for routine OV follow up on recurrent UTI.   Dysuria: She reports she has recurrent symptoms about 1 week after finishing abx. No fever, chills, abd pain or back tenderness.   Prior note: Been "awhile" at least 3 weeks. Was seen virtually and prescribed Macrobid. She states that improved her symptoms but she is still have urinary frequency and burning with urination. She has been taking azo until yesterday.    Depression screen [facility] 2/9 02/25/2020 02/10/2019 02/09/2018 12/31/2017 01/08/2017  Decreased Interest 0 0 0 0 2  Down, Depressed, Hopeless 0 0 0 0 3  PHQ - 2 Score 0 0 0 0 5  Altered sleeping - - - - 0  Tired, decreased energy - - - - 3  Change in appetite - - - - 3  Feeling bad or failure about yourself  - - - - 1  Trouble concentrating - - - - 3  Moving slowly or fidgety/restless - - - - 0  Suicidal thoughts - - - - 0  PHQ-9 Score - - - - 15   GAD 7 : Generalized Anxiety Score 01/08/2017  Nervous, Anxious, on Edge 2  Control/stop worrying 2  Worry too much - different things 0  Trouble relaxing 0  Restless 0  Easily annoyed or irritable 0  Afraid - awful might happen 0  Total GAD 7 Score 4  Anxiety Difficulty Very difficult    Allergies  Allergen Reactions  . Sulfa Antibiotics    Social History   Tobacco Use  . Smoking status: Current Every Day Smoker    Packs/day: 1.00    Years: 3.00    Pack years: 3.00    Types: Cigarettes  . Smokeless tobacco: Never Used  Substance Use Topics  . Alcohol use: No    Alcohol/week: 0.0 standard drinks   Past Medical History:  Diagnosis Date  . Allergic rhinitis     . Astigmatism    right eye  . Back pain    Dr. Karin Lieu ([facility])  . Basal cell carcinoma   . Combined form of age-related cataract, both eyes   . Cystocele   . 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 Depression   . Disorder of refraction    both eyes  . Early stage nonexudative age-related macular degeneration of both eyes   . Exophoria   . Fibrocystic breast   . GERD (gastroesophageal reflux disease)   . History of echocardiogram 12/2012   Dr. Daiva Huge: EF55-60%, mild TR, trace pulmonic and MR, abnl left vent diastolic  . Hypertension   . Nonexudative age-related macular degeneration, bilateral, early dry stage   . Piriformis syndrome 05/2013  . Posterior vitreous detachment, both eyes   . Postmenopausal   . Pseudophakia of left eye   . Psoriasis 2009  . Sciatica of left side 05/2013  . TIA (transient ischemic attack) 2000  . Urinary incontinence    Past Surgical History:  Procedure Laterality Date  . CHOLECYSTECTOMY  11/2001  . LUMBAR DISC SURGERY  04/2005   R L4-5  and L5-S1  . SKIN BIOPSY Left 1999   left hand bcc vs scc  . TONSILLECTOMY AND ADENOIDECTOMY     Family History  Problem Relation Age of Onset  . Rheum arthritis Mother   . Alcohol abuse Mother   . Heart disease Father 19       MI at 37; died  . Diabetes Father   . Dementia Maternal Grandmother    Allergies as of 03/15/2020      Reactions   Sulfa Antibiotics       Medication List       Accurate as of March 15, 2020  3:54 PM. If you have any questions, ask your nurse or doctor.        STOP taking these medications   cephALEXin 500 MG capsule Commonly known as: KEFLEX Stopped by: Howard Pouch, DO     TAKE these medications   aspirin EC 325 MG tablet Take 325 mg by mouth daily.   Debrox 6.5 % OTIC solution Generic drug: carbamide peroxide Place 5 drops into both ears daily as needed.   hydrochlorothiazide 50 MG  tablet Commonly known as: HYDRODIURIL Take 1 tablet (50 mg total) by mouth daily.   losartan 100 MG tablet Commonly known as: COZAAR Take 1 tablet (100 mg total) by mouth daily.   meloxicam 15 MG tablet Commonly known as: MOBIC Take 1 tablet (15 mg total) by mouth daily.   MULTIVITAMIN ADULT PO Take 2 Doses by mouth.   NON FORMULARY   Omega 3 1000 MG Caps Take by mouth.   oxybutynin 10 MG 24 hr tablet Commonly known as: DITROPAN-XL TK 1 T PO D       Results for orders placed or performed in visit on 03/15/20 (from the past 24 hour(s))  POCT Urinalysis Dipstick (Automated)     Status: None   Collection Time: 03/15/20  3:54 PM  Result Value Ref Range   Color, UA yellow    Clarity, UA cloudy    Glucose, UA Negative Negative   Bilirubin, UA negative    Ketones, UA negative    Spec Grav, UA 1.010 1.010 - 1.025   Blood, UA 1+    pH, UA 8.0 5.0 - 8.0   Protein, UA Negative Negative   Urobilinogen, UA 0.2 0.2 or 1.0 E.U./dL   Nitrite, UA positive    Leukocytes, UA Negative Negative   No results found.   ROS: Negative, with the exception of above mentioned in HPI  Objective:  BP 140/80 (BP Location: Left Arm, Patient Position: Sitting, Cuff Size: Normal)   Pulse 71   Temp 98.1 F (36.7 C) (Oral)   Ht 5\' 4"  (1.626 m)   Wt 145 lb (65.8 kg)   SpO2 97%   BMI 24.89 kg/m  Body mass index is 24.89 kg/m. Gen: Afebrile. No acute distress.  HENT: AT. [place].  Eyes:Pupils Equal Round Reactive to light, Extraocular movements intact,  Conjunctiva without redness, discharge or icterus. Neuro: . Alert. Oriented x3  Psych: Normal affect, dress and demeanor. Normal speech. Normal thought content and judgment.   Results for orders placed or performed in visit on 03/15/20 (from the past 24 hour(s))  POCT Urinalysis Dipstick (Automated)     Status: None   Collection Time: 03/15/20  3:54 PM  Result Value Ref Range   Color, UA yellow    Clarity, UA cloudy    Glucose, UA  Negative Negative   Bilirubin, UA negative    Ketones, UA negative  Spec Grav, UA 1.010 1.010 - 1.025   Blood, UA 1+    pH, UA 8.0 5.0 - 8.0   Protein, UA Negative Negative   Urobilinogen, UA 0.2 0.2 or 1.0 E.U./dL   Nitrite, UA positive    Leukocytes, UA Negative Negative    Assessment/Plan: [name] is a 69 y.o. female present for OV for  Dysuria - recent recurrent UTI treated with keflex TID x7 days> recurrent sx after 1 week - cipro BID x 5 days.  - POCT Urinalysis Dipstick (Automated)> + nitrites - Urine culture sent - f/u as needed.    No follow-ups on file. Orders Placed This Encounter  Procedures  . Urine Culture  . POCT Urinalysis Dipstick (Automated)   No orders of the defined types were placed in this encounter.     electronically signed by:  Howard Pouch, DO  [place]

## 2020-03-15 NOTE — Telephone Encounter (Signed)
Please advise if appt is needed

## 2020-03-15 NOTE — Telephone Encounter (Signed)
The bacteria that caused her urinary tract infection was sensitive to the antibiotic I prescribed.  Therefore if taken as directed the antibiotic should have completely cleared her urinary tract infection.   We will need to get her in to retest her urine to see if there is still an infection or if symptoms are coming from other cause.  It can be worked in today 03/15/2020 at 345 if possible.

## 2020-03-15 NOTE — Telephone Encounter (Signed)
Patient is still having urinary frequency & burning. She has completed her medication.

## 2020-03-16 LAB — URINE CULTURE
MICRO NUMBER:: 10899521
SPECIMEN QUALITY:: ADEQUATE

## 2020-03-17 ENCOUNTER — Telehealth: Payer: Self-pay | Admitting: Family Medicine

## 2020-03-17 DIAGNOSIS — R319 Hematuria, unspecified: Secondary | ICD-10-CM

## 2020-03-17 NOTE — Telephone Encounter (Signed)
Left VM for CB 

## 2020-03-17 NOTE — Telephone Encounter (Signed)
Please call patient.  Her urine culture did not show evidence of a bacterial infection.  She can continue the Cipro if she is feeling some relief from the use.  I would recommend referral to urology for further studies.  She has recurrent urinary tract infections and symptoms of urinary tract infections without infections, along with small amounts of blood in her urine which has been consistently present over the last few months.   With her history of smoking, it does put her at higher risk for more worrisome causes of her symptoms and blood in her urine, including bladder cancers.  Therefore, I referred her to urology for further evaluation to be safe.  They will call her with appointment.

## 2020-03-21 ENCOUNTER — Ambulatory Visit: Payer: Medicare Other

## 2020-03-24 NOTE — Telephone Encounter (Signed)
Pt states symptoms have cleared after finishing the antibiotics

## 2020-04-11 NOTE — Progress Notes (Signed)
Subjective:   [name] is a 69 y.o. female who presents for Medicare Annual (Subsequent) preventive examination.  I connected with Keelia today by telephone and verified that I am speaking with the correct person using two identifiers. Location patient: home Location provider: work Persons participating in the virtual visit: patient, Marine scientist.    I discussed the limitations, risks, security and privacy concerns of performing an evaluation and management service by telephone and the availability of in person appointments. I also discussed with the patient that there may be a patient responsible charge related to this service. The patient expressed understanding and verbally consented to this telephonic visit.    Interactive audio and video telecommunications were attempted between this provider and patient, however failed, due to patient having technical difficulties OR patient did not have access to video capability.  We continued and completed visit with audio only.  Some vital signs may be absent or patient reported.   Time Spent with patient on telephone encounter: 20 minutes  Review of Systems     Cardiac Risk Factors include: advanced age (>53men, >18 women);hypertension;dyslipidemia;sedentary lifestyle;smoking/ tobacco exposure     Objective:    Today's Vitals   04/12/20 0816  Weight: 145 lb (65.8 kg)  Height: 5\' 4"  (1.626 m)   Body mass index is 24.89 kg/m.  Advanced Directives 04/12/2020 02/10/2019 11/19/2016 01/12/2016  Does Patient Have a Medical Advance Directive? Yes Yes Yes;No Yes  Type of Paramedic of [place];Living will [place];Living will;Out of facility DNR (pink MOST or yellow form) - [place];Living will  Does patient want to make changes to medical advance directive? - - Yes (MAU/Ambulatory/Procedural Areas - Information given) -  Copy of [place] in Chart? No - copy  requested No - copy requested - No - copy requested    Current Medications (verified) Outpatient Encounter Medications as of 04/12/2020  Medication Sig  . aspirin EC 325 MG tablet Take 325 mg by mouth daily.  . hydrochlorothiazide (HYDRODIURIL) 50 MG tablet Take 1 tablet (50 mg total) by mouth daily.  Marland Kitchen losartan (COZAAR) 100 MG tablet Take 1 tablet (100 mg total) by mouth daily.  . meloxicam (MOBIC) 15 MG tablet Take 1 tablet (15 mg total) by mouth daily.  . Multiple Vitamins-Minerals (MULTIVITAMIN ADULT PO) Take 2 Doses by mouth.  . NON FORMULARY   . Omega 3 1000 MG CAPS Take by mouth.  . oxybutynin (DITROPAN-XL) 10 MG 24 hr tablet TK 1 T PO D  . [DISCONTINUED] carbamide peroxide (DEBROX) 6.5 % OTIC solution Place 5 drops into both ears daily as needed.  . [DISCONTINUED] ciprofloxacin (CIPRO) 500 MG tablet Take 1 tablet (500 mg total) by mouth 2 (two) times daily.   No facility-administered encounter medications on file as of 04/12/2020.    Allergies (verified) Sulfa antibiotics   History: Past Medical History:  Diagnosis Date  . Allergic rhinitis   . Astigmatism    right eye  . Back pain    Dr. Karin Lieu ([facility])  . Basal cell carcinoma   . Combined form of age-related cataract, both eyes   . Cystocele   . 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 Depression   . Disorder of refraction    both eyes  . Early stage nonexudative age-related macular degeneration of both eyes   . Exophoria   . Fibrocystic breast   . GERD (gastroesophageal reflux  disease)   . History of echocardiogram 12/2012   Dr. Daiva Huge: EF55-60%, mild TR, trace pulmonic and MR, abnl left vent diastolic  . Hypertension   . Nonexudative age-related macular degeneration, bilateral, early dry stage   . Piriformis syndrome 05/2013  . Posterior vitreous detachment, both eyes   . Postmenopausal   . Pseudophakia of left eye   . Psoriasis 2009  . Sciatica  of left side 05/2013  . TIA (transient ischemic attack) 2000  . Urinary incontinence    Past Surgical History:  Procedure Laterality Date  . CHOLECYSTECTOMY  11/2001  . LUMBAR DISC SURGERY  04/2005   R L4-5  and L5-S1  . SKIN BIOPSY Left 1999   left hand bcc vs scc  . TONSILLECTOMY AND ADENOIDECTOMY     Family History  Problem Relation Age of Onset  . Rheum arthritis Mother   . Alcohol abuse Mother   . Heart disease Father 36       MI at 17; died  . Diabetes Father   . Dementia Maternal Grandmother    Social History   Socioeconomic History  . Marital status: Widowed    Spouse name: Not on file  . Number of children: Not on file  . Years of education: Not on file  . Highest education level: Not on file  Occupational History  . Not on file  Tobacco Use  . Smoking status: Current Every Day Smoker    Packs/day: 1.00    Years: 3.00    Pack years: 3.00    Types: Cigarettes  . Smokeless tobacco: Never Used  Vaping Use  . Vaping Use: Never used  Substance and Sexual Activity  . Alcohol use: No    Alcohol/week: 0.0 standard drinks  . Drug use: No  . Sexual activity: Never  Other Topics Concern  . Not on file  Social History Narrative   Widower 2018 (Damon). 2 sons.   College. Retired.    Former smoker (quit 04/2005- 30+ pack year), occasional etoh, no drugs   Drinks caffeine, uses herbal remedies, daily vitamin use   Wears her seatbelt, wears her bicycle helmet   Exercises routinely   Smoke detector in the home.    Firearms in the home (locked)   Feels safe in her relationships.    Social Determinants of Health   Financial Resource Strain: Low Risk   . Difficulty of Paying Living Expenses: Not hard at all  Food Insecurity: No Food Insecurity  . Worried About Charity fundraiser in the Last Year: Never true  . Ran Out of Food in the Last Year: Never true  Transportation Needs: No Transportation Needs  . Lack of Transportation (Medical): No  . Lack of  Transportation (Non-Medical): No  Physical Activity: Inactive  . Days of Exercise per Week: 0 days  . Minutes of Exercise per Session: 0 min  Stress: No Stress Concern Present  . Feeling of Stress : Not at all  Social Connections: Moderately Integrated  . Frequency of Communication with Friends and Family: More than three times a week  . Frequency of Social Gatherings with Friends and Family: Once a week  . Attends Religious Services: 1 to 4 times per year  . Active Member of Clubs or Organizations: Yes  . Attends Archivist Meetings: 1 to 4 times per year  . Marital Status: Widowed    Tobacco Counseling Ready to quit: Not Answered Counseling given: Not Answered   Clinical Intake:  Pre-visit  preparation completed: Yes  Pain : No/denies pain     Nutritional Status: BMI of 19-24  Normal Nutritional Risks: None Diabetes: No  How often do you need to have someone help you when you read instructions, pamphlets, or other written materials from your doctor or pharmacy?: 1 - Never What is the last grade level you completed in school?: college  Diabetic?No Interpreter Needed?: No  Information entered by :: Caroleen Hamman LPN   Activities of Daily Living In your present state of health, do you have any difficulty performing the following activities: 04/12/2020  Hearing? N  Vision? N  Difficulty concentrating or making decisions? N  Walking or climbing stairs? N  Dressing or bathing? N  Doing errands, shopping? N  Preparing Food and eating ? N  Using the Toilet? N  In the past six months, have you accidently leaked urine? Y  Comment sees urologist  Do you have problems with loss of bowel control? N  Managing your Medications? N  Managing your Finances? N  Housekeeping or managing your Housekeeping? N  Some recent data might be hidden    Patient Care Team: [name], DO as PCP - General (Family Medicine) [name], DPM as Consulting Physician  (Podiatry)  Indicate any recent Medical Services you may have received from other than [facility] providers in the past year (date may be approximate).     Assessment:   This is a routine wellness examination for [name].  Hearing/Vision screen  Hearing Screening   125Hz  250Hz  500Hz  1000Hz  2000Hz  3000Hz  4000Hz  6000Hz  8000Hz   Right ear:           Left ear:           Comments: No hearing loss  Vision Screening Comments: Cataracts removed 2020 Last eye exam-2020-Dr freeman  Dietary issues and exercise activities discussed: Current Exercise Habits: The patient does not participate in regular exercise at present, Exercise limited by: None identified  Goals    . Patient Stated     Maintain weight with healthy eating & increasing activity      Depression Screen PHQ 2/9 Scores 04/12/2020 02/25/2020 02/10/2019 02/09/2018 12/31/2017 01/08/2017 11/19/2016  PHQ - 2 Score 0 0 0 0 0 5 0  PHQ- 9 Score - - - - - 15 -    Fall Risk Fall Risk  04/12/2020 02/25/2020 02/10/2019 02/09/2018 12/31/2017  Falls in the past year? 0 0 0 No No  Number falls in past yr: 0 - - - -  Injury with Fall? 0 - - - -  Follow up Falls prevention discussed - Falls prevention discussed;Education provided;Falls evaluation completed - -    Any stairs in or around the home? No  Home free of loose throw rugs in walkways, pet beds, electrical cords, etc? Yes  Adequate lighting in your home to reduce risk of falls? Yes   ASSISTIVE DEVICES UTILIZED TO PREVENT FALLS:  Life alert? No  Use of a cane, walker or w/c? No  Grab bars in the bathroom? Yes  Shower chair or bench in shower? No  Elevated toilet seat or a handicapped toilet? No   TIMED UP AND GO:  Was the test performed? No . Telephonic visit   Cognitive Function:NO cognitive impairment noted     6CIT Screen 04/12/2020  What Year? 0 points  What month? 0 points  What time? 0 points  Count back from 20 0 points  Months in reverse 0 points  Repeat phrase 0 points  Total Score 0    Immunizations Immunization History  Administered Date(s) Administered  . Moderna SARS-COVID-2 Vaccination 08/14/2019, 09/13/2019  . Tdap 12/21/2013  . Zoster 06/24/2013    TDAP status: Up to date   Flu Vaccine status: Declined, Education has been provided regarding the importance of this vaccine but patient still declined. Advised may receive this vaccine at local pharmacy or Health Dept. Aware to provide a copy of the vaccination record if obtained from local pharmacy or Health Dept. Verbalized acceptance and understanding.   Pneumococcal vaccine status: Declined,  Education has been provided regarding the importance of this vaccine but patient still declined. Advised may receive this vaccine at local pharmacy or Health Dept. Aware to provide a copy of the vaccination record if obtained from local pharmacy or Health Dept. Verbalized acceptance and understanding.    Covid-19 vaccine status: Completed vaccines  Qualifies for Shingles Vaccine? Yes   Zostavax completed No   Shingrix Completed?: No.    Education has been provided regarding the importance of this vaccine. Patient has been advised to call insurance company to determine out of pocket expense if they have not yet received this vaccine. Advised may also receive vaccine at local pharmacy or Health Dept. Verbalized acceptance and understanding.  Screening Tests Health Maintenance  Topic Date Due  . TETANUS/TDAP  12/22/2023  . DEXA SCAN  Completed  . COVID-19 Vaccine  Completed  . Hepatitis C Screening  Completed  . INFLUENZA VACCINE  Discontinued  . MAMMOGRAM  Discontinued  . COLONOSCOPY  Discontinued  . PNA vac Low Risk Adult  Discontinued    Health Maintenance  There are no preventive care reminders to display for this patient.   Colorectal Cancer Screening: Declined   Mammogram [place]: Declined  Bone Density Status: declined  Patient instructed to call the office if she changes her mind about  health maintenance screenings.  Lung Cancer Screening: (Low Dose CT Chest recommended if Age 11-80 years, 30 pack-year currently smoking OR have quit w/in 15years.) does qualify.   Lung Cancer Screening Referral: Ordered today. Patient aware that someone will be calling her to schedule.  Additional Screening:  Hepatitis C Screening:  Completed 01/25/2016  Vision Screening: Recommended annual ophthalmology exams for early detection of glaucoma and other disorders of the eye. Is the patient up to date with their annual eye exam?  Yes  Who is the provider or what is the name of the office in which the patient attends annual eye exams? Dr. Domingo Cocking   Dental Screening: Recommended annual dental exams for proper oral hygiene  Community Resource Referral / Chronic Care Management: CRR required this visit?  No   CCM required this visit?  No      Plan:     I have personally reviewed and noted the following in the patient's chart:   . Medical and social history . Use of alcohol, tobacco or illicit drugs  . Current medications and supplements . Functional ability and status . Nutritional status . Physical activity . Advanced directives . List of other physicians . Hospitalizations, surgeries, and ER visits in previous 12 months . Vitals . Screenings to include cognitive, depression, and falls . Referrals and appointments  In addition, I have reviewed and discussed with patient certain preventive protocols, quality metrics, and best practice recommendations. A written personalized care plan for preventive services as well as general preventive health recommendations were provided to patient.    Due to this being a telephonic visit, the after visit  summary with patients personalized plan was offered to patient via mail or my-chart.  Per patient request, copy mailed.  Marta Antu, LPN   01/23/7870  Nurse Health Advisor  Nurse Notes: None

## 2020-04-12 ENCOUNTER — Ambulatory Visit (INDEPENDENT_AMBULATORY_CARE_PROVIDER_SITE_OTHER): Payer: Medicare Other

## 2020-04-12 VITALS — Ht 64.0 in | Wt 145.0 lb

## 2020-04-12 DIAGNOSIS — Z122 Encounter for screening for malignant neoplasm of respiratory organs: Secondary | ICD-10-CM

## 2020-04-12 DIAGNOSIS — Z0000 Encounter for general adult medical examination without abnormal findings: Secondary | ICD-10-CM

## 2020-04-12 NOTE — Patient Instructions (Signed)
[name] , Thank you for taking time to complete your Medicare Wellness Visit. I appreciate your ongoing commitment to your health goals. Please review the following plan we discussed and let me know if I can assist you in the future.   Screening recommendations/referrals: Colonoscopy: Declined. Please call the office to schedule if you change your mind. Mammogram:Declined. Please call the office to schedule if you change your mind. Bone Density: Declined. Please call the office to schedule if you change your mind. Recommended yearly ophthalmology/optometry visit for glaucoma screening and checkup Recommended yearly dental visit for hygiene and checkup  Vaccinations: Influenza vaccine: Declined Pneumococcal vaccine: Declined Tdap vaccine: Up to Date-Due-12/22/2023 Shingles vaccine: Declined   Covid-19:Completed vaccines  Advanced directives: Please bring a copy for your chart  Conditions/risks identified: See problem list  Next appointment: Follow up in one year for your annual wellness visit 04/18/21/@ 8:15am.   Preventive Care 71 Years and Older, Female Preventive care refers to lifestyle choices and visits with your health care provider that can promote health and wellness. What does preventive care include?  A yearly physical exam. This is also called an annual well check.  Dental exams once or twice a year.  Routine eye exams. Ask your health care provider how often you should have your eyes checked.  Personal lifestyle choices, including:  Daily care of your teeth and gums.  Regular physical activity.  Eating a healthy diet.  Avoiding tobacco and drug use.  Limiting alcohol use.  Practicing safe sex.  Taking low-dose aspirin every day.  Taking vitamin and mineral supplements as recommended by your health care provider. What happens during an annual well check? The services and screenings done by your health care provider during your annual well check will depend  on your age, overall health, lifestyle risk factors, and family history of disease. Counseling  Your health care provider may ask you questions about your:  Alcohol use.  Tobacco use.  Drug use.  Emotional well-being.  Home and relationship well-being.  Sexual activity.  Eating habits.  History of falls.  Memory and ability to understand (cognition).  Work and work Statistician.  Reproductive health. Screening  You may have the following tests or measurements:  Height, weight, and BMI.  Blood pressure.  Lipid and cholesterol levels. These may be checked every 5 years, or more frequently if you are over 71 years old.  Skin check.  Lung cancer screening. You may have this screening every year starting at age 71 if you have a 30-pack-year history of smoking and currently smoke or have quit within the past 15 years.  Fecal occult blood test (FOBT) of the stool. You may have this test every year starting at age 71.  Flexible sigmoidoscopy or colonoscopy. You may have a sigmoidoscopy every 5 years or a colonoscopy every 10 years starting at age 71.  Hepatitis C blood test.  Hepatitis B blood test.  Sexually transmitted disease (STD) testing.  Diabetes screening. This is done by checking your blood sugar (glucose) after you have not eaten for a while (fasting). You may have this done every 1-3 years.  Bone density scan. This is done to screen for osteoporosis. You may have this done starting at age 71.  Mammogram. This may be done every 1-2 years. Talk to your health care provider about how often you should have regular mammograms. Talk with your health care provider about your test results, treatment options, and if necessary, the need for more tests. Vaccines  Your health care provider may recommend certain vaccines, such as:  Influenza vaccine. This is recommended every year.  Tetanus, diphtheria, and acellular pertussis (Tdap, Td) vaccine. You may need a Td  booster every 10 years.  Zoster vaccine. You may need this after age 71.  Pneumococcal 13-valent conjugate (PCV13) vaccine. One dose is recommended after age 71.  Pneumococcal polysaccharide (PPSV23) vaccine. One dose is recommended after age 75. Talk to your health care provider about which screenings and vaccines you need and how often you need them. This information is not intended to replace advice given to you by your health care provider. Make sure you discuss any questions you have with your health care provider. Document Released: 07/28/2015 Document Revised: 03/20/2016 Document Reviewed: 05/02/2015 Elsevier Interactive Patient Education  2017 [employer] Prevention in the Home Falls can cause injuries. They can happen to people of all ages. There are many things you can do to make your home safe and to help prevent falls. What can I do on the outside of my home?  Regularly fix the edges of walkways and driveways and fix any cracks.  Remove anything that might make you trip as you walk through a door, such as a raised step or threshold.  Trim any bushes or trees on the path to your home.  Use bright outdoor lighting.  Clear any walking paths of anything that might make someone trip, such as rocks or tools.  Regularly check to see if handrails are loose or broken. Make sure that both sides of any steps have handrails.  Any raised decks and porches should have guardrails on the edges.  Have any leaves, snow, or ice cleared regularly.  Use sand or salt on walking paths during winter.  Clean up any spills in your garage right away. This includes oil or grease spills. What can I do in the bathroom?  Use night lights.  Install grab bars by the toilet and in the tub and shower. Do not use towel bars as grab bars.  Use non-skid mats or decals in the tub or shower.  If you need to sit down in the shower, use a plastic, non-slip stool.  Keep the floor dry. Clean up  any water that spills on the floor as soon as it happens.  Remove soap buildup in the tub or shower regularly.  Attach bath mats securely with double-sided non-slip rug tape.  Do not have throw rugs and other things on the floor that can make you trip. What can I do in the bedroom?  Use night lights.  Make sure that you have a light by your bed that is easy to reach.  Do not use any sheets or blankets that are too big for your bed. They should not hang down onto the floor.  Have a firm chair that has side arms. You can use this for support while you get dressed.  Do not have throw rugs and other things on the floor that can make you trip. What can I do in the kitchen?  Clean up any spills right away.  Avoid walking on wet floors.  Keep items that you use a lot in easy-to-reach places.  If you need to reach something above you, use a strong step stool that has a grab bar.  Keep electrical cords out of the way.  Do not use floor polish or wax that makes floors slippery. If you must use wax, use non-skid floor wax.  Do  not have throw rugs and other things on the floor that can make you trip. What can I do with my stairs?  Do not leave any items on the stairs.  Make sure that there are handrails on both sides of the stairs and use them. Fix handrails that are broken or loose. Make sure that handrails are as long as the stairways.  Check any carpeting to make sure that it is firmly attached to the stairs. Fix any carpet that is loose or worn.  Avoid having throw rugs at the top or bottom of the stairs. If you do have throw rugs, attach them to the floor with carpet tape.  Make sure that you have a light switch at the top of the stairs and the bottom of the stairs. If you do not have them, ask someone to add them for you. What else can I do to help prevent falls?  Shrieves shoes that:  Do not have high heels.  Have rubber bottoms.  Are comfortable and fit you well.  Are  closed at the toe. Do not Morrill sandals.  If you use a stepladder:  Make sure that it is fully opened. Do not climb a closed stepladder.  Make sure that both sides of the stepladder are locked into place.  Ask someone to hold it for you, if possible.  Clearly mark and make sure that you can see:  Any grab bars or handrails.  First and last steps.  Where the edge of each step is.  Use tools that help you move around (mobility aids) if they are needed. These include:  Canes.  Walkers.  Scooters.  Crutches.  Turn on the lights when you go into a dark area. Replace any light bulbs as soon as they burn out.  Set up your furniture so you have a clear path. Avoid moving your furniture around.  If any of your floors are uneven, fix them.  If there are any pets around you, be aware of where they are.  Review your medicines with your doctor. Some medicines can make you feel dizzy. This can increase your chance of falling. Ask your doctor what other things that you can do to help prevent falls. This information is not intended to replace advice given to you by your health care provider. Make sure you discuss any questions you have with your health care provider. Document Released: 04/27/2009 Document Revised: 12/07/2015 Document Reviewed: 08/05/2014 Elsevier Interactive Patient Education  2017 [employer].

## 2020-05-08 ENCOUNTER — Other Ambulatory Visit: Payer: Self-pay | Admitting: *Deleted

## 2020-05-08 DIAGNOSIS — F17210 Nicotine dependence, cigarettes, uncomplicated: Secondary | ICD-10-CM

## 2020-05-08 DIAGNOSIS — Z87891 Personal history of nicotine dependence: Secondary | ICD-10-CM

## 2020-05-09 DIAGNOSIS — Z23 Encounter for immunization: Secondary | ICD-10-CM | POA: Diagnosis not present

## 2020-05-22 ENCOUNTER — Encounter: Payer: Self-pay | Admitting: Acute Care

## 2020-05-22 ENCOUNTER — Other Ambulatory Visit: Payer: Self-pay

## 2020-05-22 ENCOUNTER — Ambulatory Visit (INDEPENDENT_AMBULATORY_CARE_PROVIDER_SITE_OTHER): Payer: Medicare Other | Admitting: Acute Care

## 2020-05-22 ENCOUNTER — Ambulatory Visit (INDEPENDENT_AMBULATORY_CARE_PROVIDER_SITE_OTHER): Payer: Medicare Other

## 2020-05-22 DIAGNOSIS — F17210 Nicotine dependence, cigarettes, uncomplicated: Secondary | ICD-10-CM

## 2020-05-22 DIAGNOSIS — Z122 Encounter for screening for malignant neoplasm of respiratory organs: Secondary | ICD-10-CM

## 2020-05-22 DIAGNOSIS — Z87891 Personal history of nicotine dependence: Secondary | ICD-10-CM

## 2020-05-22 NOTE — Patient Instructions (Signed)
Thank you for participating in the [facility]. It was our pleasure to meet you today. We will call you with the results of your scan within the next few days. Your scan will be assigned a Lung RADS category score by the physicians reading the scans.  This Lung RADS score determines follow up scanning.  See below for description of categories, and follow up screening recommendations. We will be in touch to schedule your follow up screening annually or based on recommendations of our providers. We will fax a copy of your scan results to your Primary Care Physician, or the physician who referred you to the program, to ensure they have the results. Please call the office if you have any questions or concerns regarding your scanning experience or results.  Our office number is [phone]. Please speak with Denise Phelps, RN. She is our Lung Cancer Screening RN. If she is unavailable when you call, please have the office staff send her a message. She will return your call at her earliest convenience. Remember, if your scan is normal, we will scan you annually as long as you continue to meet the criteria for the program. (Age 55-77, Current smoker or smoker who has quit within the last 15 years). If you are a smoker, remember, quitting is the single most powerful action that you can take to decrease your risk of lung cancer and other pulmonary, breathing related problems. We know quitting is hard, and we are here to help.  Please let us know if there is anything we can do to help you meet your goal of quitting. If you are a former smoker, congratulations. We are proud of you! Remain smoke free! Remember you can refer friends or family members through the number above.  We will screen them to make sure they meet criteria for the program. Thank you for helping us take better care of you by participating in Lung Screening.  Lung RADS Categories:  Lung RADS 1: no nodules  or definitely non-concerning nodules.  Recommendation is for a repeat annual scan in 12 months.  Lung RADS 2:  nodules that are non-concerning in appearance and behavior with a very low likelihood of becoming an active cancer. Recommendation is for a repeat annual scan in 12 months.  Lung RADS 3: nodules that are probably non-concerning , includes nodules with a low likelihood of becoming an active cancer.  Recommendation is for a 6-month repeat screening scan. Often noted after an upper respiratory illness. We will be in touch to make sure you have no questions, and to schedule your 6-month scan.  Lung RADS 4 A: nodules with concerning findings, recommendation is most often for a follow up scan in 3 months or additional testing based on our provider's assessment of the scan. We will be in touch to make sure you have no questions and to schedule the recommended 3 month follow up scan.  Lung RADS 4 B:  indicates findings that are concerning. We will be in touch with you to schedule additional diagnostic testing based on our provider's  assessment of the scan.   

## 2020-05-22 NOTE — Progress Notes (Signed)
Shared Decision Making Visit Lung Cancer Screening Program [phone])   Eligibility:  Age 69 y.o.  Pack Years Smoking History Calculation 41 pack years (# packs/per year x # years smoked)  Recent History of coughing up blood  no  Unexplained weight loss? no ( >Than 15 pounds within the last 6 months )  Prior History Lung / other cancer no (Diagnosis within the last 5 years already requiring surveillance chest CT Scans).  Smoking Status Current Smoker  Former Smokers: Years since quit: NA  Quit Date: NA  Visit Components:  Discussion included one or more decision making aids. yes  Discussion included risk/benefits of screening. yes  Discussion included potential follow up diagnostic testing for abnormal scans. yes  Discussion included meaning and risk of over diagnosis. yes  Discussion included meaning and risk of False Positives. yes  Discussion included meaning of total radiation exposure. yes  Counseling Included:  Importance of adherence to annual lung cancer LDCT screening. yes  Impact of comorbidities on ability to participate in the program. yes  Ability and willingness to under diagnostic treatment. yes  Smoking Cessation Counseling:  Current Smokers:   Discussed importance of smoking cessation. yes  Information about tobacco cessation classes and interventions provided to patient. yes  Patient provided with "ticket" for LDCT Scan. yes  Symptomatic Patient. no  CounselingNA  Diagnosis Code: Tobacco Use Z72.0  Asymptomatic Patient yes  Counseling (Intermediate counseling: > three minutes counseling) [id number]  Former Smokers:   Discussed the importance of maintaining cigarette abstinence. yes  Diagnosis Code: Personal History of Nicotine Dependence. O87.867  Information about tobacco cessation classes and interventions provided to patient. Yes  Patient provided with "ticket" for LDCT Scan. yes  Written Order for Lung Cancer Screening with LDCT  placed in Epic. Yes (CT Chest Lung Cancer Screening Low Dose W/O CM) [id number] Z12.2-Screening of respiratory organs Z87.891-Personal history of nicotine dependence  I have spent 25 minutes of face to face time withMs. Pandolfi* discussing the risks and benefits of lung cancer screening. We viewed a power point together that explained in detail the above noted topics. We paused at intervals to allow for questions to be asked and answered to ensure understanding.We discussed that the single most powerful action that she can take to decrease her risk of developing lung cancer is to quit smoking. We discussed whether or not she is ready to commit to setting a quit date. We discussed options for tools to aid in quitting smoking including nicotine replacement therapy, non-nicotine medications, support groups, Quit Smart classes, and behavior modification. We discussed that often times setting smaller, more achievable goals, such as eliminating 1 cigarette a day for a week and then 2 cigarettes a day for a week can be helpful in slowly decreasing the number of cigarettes smoked. This allows for a sense of accomplishment as well as providing a clinical benefit. I gave her the " Be Stronger Than Your Excuses" card with contact information for community resources, classes, free nicotine replacement therapy, and access to mobile apps, text messaging, and on-line smoking cessation help. I have also given her my card and contact information in the event she needs to contact me. We discussed the time and location of the scan, and that either Doroteo Glassman RN or I will call with the results within 24-48 hours of receiving them. I have offered her  a copy of the power point we viewed  as a resource in the event they need reinforcement of the concepts we  discussed today in the office. The patient verbalized understanding of all of  the above and had no further questions upon leaving the office. They have my contact information in  the event they have any further questions.  I spent 3 minutes counseling on smoking cessation and the health risks of continued tobacco abuse.  I explained to the patient that there has been a high incidence of coronary artery disease noted on these exams. I explained that this is a non-gated exam therefore degree or severity cannot be determined. This patient is not on statin therapy. I have asked the patient to follow-up with their PCP regarding any incidental finding of coronary artery disease and management with diet or medication as their PCP  feels is clinically indicated. The patient verbalized understanding of the above and had no further questions upon completion of the visit.      Magdalen Spatz, NP 05/22/2020 10:17 AM

## 2020-05-23 NOTE — Progress Notes (Signed)
Please call patient and let them  know their  low dose Ct was read as a Lung RADS 2: nodules that are benign in appearance and behavior with a very low likelihood of becoming a clinically active cancer due to size or lack of growth. Recommendation per radiology is for a repeat LDCT in 12 months. .Please let them  know we will order and schedule their  annual screening scan for 07/2020. Please let them  know there was notation of CAD on their  scan.  Please remind the patient  that this is a non-gated exam therefore degree or severity of disease  cannot be determined. Please have them  follow up with their PCP regarding potential risk factor modification, dietary therapy or pharmacologic therapy if clinically indicated. Pt.  is not  currently on statin therapy. Please place order for annual  screening scan for  05/2021 and fax results to PCP. Thanks so much.  Please have patient follow up with PCP about 3 vessel CAD. I do not see a cards doc or echo in her chart here at [facility].

## 2020-05-24 ENCOUNTER — Other Ambulatory Visit: Payer: Self-pay | Admitting: *Deleted

## 2020-05-24 DIAGNOSIS — F17210 Nicotine dependence, cigarettes, uncomplicated: Secondary | ICD-10-CM

## 2020-05-24 DIAGNOSIS — Z87891 Personal history of nicotine dependence: Secondary | ICD-10-CM

## 2020-08-19 ENCOUNTER — Other Ambulatory Visit: Payer: Self-pay | Admitting: Family Medicine

## 2020-08-19 DIAGNOSIS — I10 Essential (primary) hypertension: Secondary | ICD-10-CM

## 2020-08-21 ENCOUNTER — Other Ambulatory Visit: Payer: Self-pay | Admitting: Family Medicine

## 2020-08-21 DIAGNOSIS — I10 Essential (primary) hypertension: Secondary | ICD-10-CM

## 2020-08-26 DIAGNOSIS — Z20822 Contact with and (suspected) exposure to covid-19: Secondary | ICD-10-CM | POA: Diagnosis not present

## 2020-08-26 DIAGNOSIS — Z03818 Encounter for observation for suspected exposure to other biological agents ruled out: Secondary | ICD-10-CM | POA: Diagnosis not present

## 2020-09-16 DIAGNOSIS — J209 Acute bronchitis, unspecified: Secondary | ICD-10-CM | POA: Diagnosis not present

## 2020-09-17 ENCOUNTER — Other Ambulatory Visit: Payer: Self-pay | Admitting: Family Medicine

## 2020-09-17 DIAGNOSIS — I10 Essential (primary) hypertension: Secondary | ICD-10-CM

## 2020-10-03 DIAGNOSIS — N3946 Mixed incontinence: Secondary | ICD-10-CM | POA: Diagnosis not present

## 2020-10-03 DIAGNOSIS — R350 Frequency of micturition: Secondary | ICD-10-CM | POA: Diagnosis not present

## 2020-10-09 ENCOUNTER — Other Ambulatory Visit: Payer: Self-pay | Admitting: Family Medicine

## 2020-10-09 DIAGNOSIS — I10 Essential (primary) hypertension: Secondary | ICD-10-CM

## 2020-10-10 ENCOUNTER — Other Ambulatory Visit: Payer: Self-pay

## 2020-10-11 ENCOUNTER — Ambulatory Visit (INDEPENDENT_AMBULATORY_CARE_PROVIDER_SITE_OTHER): Payer: Medicare Other | Admitting: Family Medicine

## 2020-10-11 ENCOUNTER — Encounter: Payer: Self-pay | Admitting: Family Medicine

## 2020-10-11 VITALS — BP 133/83 | HR 93 | Temp 98.5°F | Ht 63.0 in | Wt 145.0 lb

## 2020-10-11 DIAGNOSIS — M1990 Unspecified osteoarthritis, unspecified site: Secondary | ICD-10-CM

## 2020-10-11 DIAGNOSIS — R17 Unspecified jaundice: Secondary | ICD-10-CM | POA: Diagnosis not present

## 2020-10-11 DIAGNOSIS — E782 Mixed hyperlipidemia: Secondary | ICD-10-CM | POA: Diagnosis not present

## 2020-10-11 DIAGNOSIS — E663 Overweight: Secondary | ICD-10-CM

## 2020-10-11 DIAGNOSIS — I10 Essential (primary) hypertension: Secondary | ICD-10-CM | POA: Diagnosis not present

## 2020-10-11 LAB — COMPREHENSIVE METABOLIC PANEL
ALT: 15 U/L (ref 0–35)
AST: 16 U/L (ref 0–37)
Albumin: 4.1 g/dL (ref 3.5–5.2)
Alkaline Phosphatase: 59 U/L (ref 39–117)
BUN: 27 mg/dL — ABNORMAL HIGH (ref 6–23)
CO2: 31 mEq/L (ref 19–32)
Calcium: 9.6 mg/dL (ref 8.4–10.5)
Chloride: 100 mEq/L (ref 96–112)
Creatinine, Ser: 0.55 mg/dL (ref 0.40–1.20)
GFR: 92.33 mL/min (ref >60.00)
Glucose, Bld: 98 mg/dL (ref 70–99)
Potassium: 3.5 mEq/L (ref 3.5–5.1)
Sodium: 138 mEq/L (ref 135–145)
Total Bilirubin: 0.8 mg/dL (ref 0.2–1.2)
Total Protein: 6.4 g/dL (ref 6.0–8.3)

## 2020-10-11 MED ORDER — HYDROCHLOROTHIAZIDE 50 MG PO TABS: ORAL_TABLET | ORAL | 1 refills | Status: DC

## 2020-10-11 MED ORDER — MELOXICAM 15 MG PO TABS: 15.0000 mg | ORAL_TABLET | Freq: Every day | ORAL | 3 refills | Status: DC

## 2020-10-11 MED ORDER — LOSARTAN POTASSIUM 100 MG PO TABS: 100.0000 mg | ORAL_TABLET | Freq: Every day | ORAL | 1 refills | Status: DC

## 2020-10-11 NOTE — Patient Instructions (Signed)
BP looks good. I have refilled your meds. We will recheck your bilirubin levels to make sure it normal.      Next appt 5.5 months.

## 2020-10-11 NOTE — Progress Notes (Signed)
/   [name] , Jan 19, 1951, 70 y.o., female MRN: [id number] Patient Care Team    Relationship Specialty Notifications Start End  [name], DO PCP - General Family Medicine  01/04/16   [name], DPM Consulting Physician Podiatry  01/12/16     Chief Complaint  Patient presents with  . Follow-up    CMC; Pt is not fasting    Subjective: Pt presents for routine OV follow up on hypertension.  Hypertension/morbid obesity/hyperlipidemia: Pt reports compliance with Cozaar 100  QD and HCTZ 50 qd. Patient denies chest pain, shortness of breath, dizziness or lower extremity edema.  Pt takes daily ASA. Pt is not prescribed statin. Labs due Exercise: routine exercise.  RF: Hypertension, history of TIA, smoker, family history of heart disease  Arthritis: Patient reports compliance  with  meloxicam and it has been working well.  She denies any swelling, bruising or bleeding.  Elevated Bilirubin: 1.8 bili last appt. She forgot to come in for recheck. No h/o [place] or prior elevation.   Depression screen [facility] 2/9 04/12/2020 02/25/2020 02/10/2019 02/09/2018 12/31/2017  Decreased Interest 0 0 0 0 0  Down, Depressed, Hopeless 0 0 0 0 0  PHQ - 2 Score 0 0 0 0 0  Altered sleeping - - - - -  Tired, decreased energy - - - - -  Change in appetite - - - - -  Feeling bad or failure about yourself  - - - - -  Trouble concentrating - - - - -  Moving slowly or fidgety/restless - - - - -  Suicidal thoughts - - - - -  PHQ-9 Score - - - - -   GAD 7 : Generalized Anxiety Score 01/08/2017  Nervous, Anxious, on Edge 2  Control/stop worrying 2  Worry too much - different things 0  Trouble relaxing 0  Restless 0  Easily annoyed or irritable 0  Afraid - awful might happen 0  Total GAD 7 Score 4  Anxiety Difficulty Very difficult    Allergies  Allergen Reactions  . Sulfa Antibiotics    Social History   Tobacco Use  . Smoking status: Current Every Day Smoker    Packs/day: 1.00     Years: 3.00    Pack years: 3.00    Types: Cigarettes  . Smokeless tobacco: Never Used  Substance Use Topics  . Alcohol use: No    Alcohol/week: 0.0 standard drinks   Past Medical History:  Diagnosis Date  . Allergic rhinitis   . Astigmatism    right eye  . Back pain    Dr. Karin Lieu ([facility])  . Basal cell carcinoma   . Combined form of age-related cataract, both eyes   . Cystocele   . 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 Depression   . Disorder of refraction    both eyes  . Early stage nonexudative age-related macular degeneration of both eyes   . Exophoria   . Fibrocystic breast   . GERD (gastroesophageal reflux disease)   . History of echocardiogram 12/2012   Dr. Daiva Huge: EF55-60%, mild TR, trace pulmonic and MR, abnl left vent diastolic  . Hypertension   . Nonexudative age-related macular degeneration, bilateral, early dry stage   . Piriformis syndrome 05/2013  . Posterior vitreous detachment, both eyes   . Postmenopausal   . Pseudophakia of left eye   . Psoriasis 2009  . Sciatica of left side 05/2013  .  TIA (transient ischemic attack) 2000  . Urinary incontinence    Past Surgical History:  Procedure Laterality Date  . CHOLECYSTECTOMY  11/2001  . LUMBAR DISC SURGERY  04/2005   R L4-5  and L5-S1  . SKIN BIOPSY Left 1999   left hand bcc vs scc  . TONSILLECTOMY AND ADENOIDECTOMY     Family History  Problem Relation Age of Onset  . Rheum arthritis Mother   . Alcohol abuse Mother   . Heart disease Father 99       MI at 89; died  . Diabetes Father   . Dementia Maternal Grandmother    Allergies as of 10/11/2020      Reactions   Sulfa Antibiotics       Medication List       Accurate as of October 11, 2020  1:27 PM. If you have any questions, ask your nurse or doctor.        STOP taking these medications   oxybutynin 10 MG 24 hr tablet Commonly known as: DITROPAN-XL Stopped by: Howard Pouch, DO      TAKE these medications   aspirin EC 325 MG tablet Take 325 mg by mouth daily.   hydrochlorothiazide 50 MG tablet Commonly known as: HYDRODIURIL TAKE 1 TABLET(50 MG) BY MOUTH DAILY   losartan 100 MG tablet Commonly known as: COZAAR Take 1 tablet (100 mg total) by mouth daily.   meloxicam 15 MG tablet Commonly known as: MOBIC Take 1 tablet (15 mg total) by mouth daily.   MULTIVITAMIN ADULT PO Take 1 Dose by mouth.   NON FORMULARY   Omega 3 1000 MG Caps Take by mouth.   tolterodine 4 MG 24 hr capsule Commonly known as: DETROL LA Take 4 mg by mouth daily.       No results found for this or any previous visit (from the past 24 hour(s)). No results found.   ROS: Negative, with the exception of above mentioned in HPI  Objective:  BP 133/83   Pulse 93   Temp 98.5 F (36.9 C) (Oral)   Ht 5' 3"  (1.6 m)   Wt 145 lb (65.8 kg)   SpO2 97%   BMI 25.69 kg/m  Body mass index is 25.69 kg/m. Gen: Afebrile. No acute distress.  HENT: AT. [place]. Eyes:Pupils Equal Round Reactive to light, Extraocular movements intact,  Conjunctiva without redness, discharge or icterus. Neck/lymp/endocrine: Supple,no lymphadenopathy, no thyromegaly CV: RRR no murmur, no edema, +2/4 P posterior tibialis pulses Chest: CTAB, no wheeze or crackles Abd: Soft. NTND. BS present Neuro: Normal gait. PERLA. EOMi. Alert. Oriented x3 Psych: Normal affect, dress and demeanor. Normal speech. Normal thought content and judgment.    No results found for this or any previous visit (from the past 24 hour(s)).  Assessment/Plan: [name] is a 70 y.o. female present for OV for  Essential hypertension, benign/morbid obesity/hyperlipidemia/history of TIA - stable - continue  losartan 100  - continue  HCTZ 50 mg daily. - Continue ASA 325 - low sodium, exercise.  - Follow-up 5.5 months  Arthritis:  - stable - continue mobic  15 mg daily   Elevated bili: CMP collected today- not fasting.   asymptomatic  Return in about 6 months (around 03/29/2021) for CMC (30 min). Orders Placed This Encounter  Procedures  . Comp Met (CMET)   Meds ordered this encounter  Medications  . hydrochlorothiazide (HYDRODIURIL) 50 MG tablet    Sig: TAKE 1 TABLET(50 MG) BY MOUTH DAILY  Dispense:  90 tablet    Refill:  1    MUST HAVE OV FOR FURTHER REFILLS  . losartan (COZAAR) 100 MG tablet    Sig: Take 1 tablet (100 mg total) by mouth daily.    Dispense:  90 tablet    Refill:  1  . meloxicam (MOBIC) 15 MG tablet    Sig: Take 1 tablet (15 mg total) by mouth daily.    Dispense:  90 tablet    Refill:  3      electronically signed by:  Howard Pouch, DO  [place]

## 2020-10-19 DIAGNOSIS — Z03818 Encounter for observation for suspected exposure to other biological agents ruled out: Secondary | ICD-10-CM | POA: Diagnosis not present

## 2020-10-19 DIAGNOSIS — Z20822 Contact with and (suspected) exposure to covid-19: Secondary | ICD-10-CM | POA: Diagnosis not present

## 2020-11-07 DIAGNOSIS — Z23 Encounter for immunization: Secondary | ICD-10-CM | POA: Diagnosis not present

## 2020-11-09 DIAGNOSIS — R350 Frequency of micturition: Secondary | ICD-10-CM | POA: Diagnosis not present

## 2020-11-09 DIAGNOSIS — N3946 Mixed incontinence: Secondary | ICD-10-CM | POA: Diagnosis not present

## 2020-11-29 DIAGNOSIS — R059 Cough, unspecified: Secondary | ICD-10-CM | POA: Insufficient documentation

## 2020-11-29 DIAGNOSIS — F17200 Nicotine dependence, unspecified, uncomplicated: Secondary | ICD-10-CM | POA: Insufficient documentation

## 2020-11-29 DIAGNOSIS — J209 Acute bronchitis, unspecified: Secondary | ICD-10-CM | POA: Diagnosis not present

## 2020-11-29 DIAGNOSIS — R519 Headache, unspecified: Secondary | ICD-10-CM | POA: Diagnosis not present

## 2021-02-16 ENCOUNTER — Other Ambulatory Visit: Payer: Self-pay

## 2021-03-22 DIAGNOSIS — Z23 Encounter for immunization: Secondary | ICD-10-CM | POA: Diagnosis not present

## 2021-04-09 ENCOUNTER — Ambulatory Visit (INDEPENDENT_AMBULATORY_CARE_PROVIDER_SITE_OTHER): Payer: Medicare Other | Admitting: Family Medicine

## 2021-04-09 ENCOUNTER — Encounter: Payer: Self-pay | Admitting: Family Medicine

## 2021-04-09 ENCOUNTER — Other Ambulatory Visit: Payer: Self-pay

## 2021-04-09 VITALS — BP 125/85 | HR 88 | Temp 98.5°F | Ht 63.0 in | Wt 148.0 lb

## 2021-04-09 DIAGNOSIS — E782 Mixed hyperlipidemia: Secondary | ICD-10-CM | POA: Diagnosis not present

## 2021-04-09 DIAGNOSIS — R17 Unspecified jaundice: Secondary | ICD-10-CM

## 2021-04-09 DIAGNOSIS — F17200 Nicotine dependence, unspecified, uncomplicated: Secondary | ICD-10-CM

## 2021-04-09 DIAGNOSIS — E663 Overweight: Secondary | ICD-10-CM | POA: Diagnosis not present

## 2021-04-09 DIAGNOSIS — I10 Essential (primary) hypertension: Secondary | ICD-10-CM | POA: Diagnosis not present

## 2021-04-09 LAB — COMPREHENSIVE METABOLIC PANEL
ALT: 14 U/L (ref 0–35)
AST: 16 U/L (ref 0–37)
Albumin: 4.1 g/dL (ref 3.5–5.2)
Alkaline Phosphatase: 68 U/L (ref 39–117)
BUN: 17 mg/dL (ref 6–23)
CO2: 26 mEq/L (ref 19–32)
Calcium: 9.3 mg/dL (ref 8.4–10.5)
Chloride: 100 mEq/L (ref 96–112)
Creatinine, Ser: 0.54 mg/dL (ref 0.40–1.20)
GFR: 92.42 mL/min (ref >60.00)
Glucose, Bld: 85 mg/dL (ref 70–99)
Potassium: 3.7 mEq/L (ref 3.5–5.1)
Sodium: 135 mEq/L (ref 135–145)
Total Bilirubin: 1.1 mg/dL (ref 0.2–1.2)
Total Protein: 6.5 g/dL (ref 6.0–8.3)

## 2021-04-09 LAB — LIPID PANEL
Cholesterol: 146 mg/dL (ref 0–200)
HDL: 40.4 mg/dL (ref >39.00)
LDL Cholesterol: 87 mg/dL (ref 0–99)
NonHDL: 105.35
Total CHOL/HDL Ratio: 4
Triglycerides: 94 mg/dL (ref 0.0–149.0)
VLDL: 18.8 mg/dL (ref 0.0–40.0)

## 2021-04-09 LAB — CBC WITH DIFFERENTIAL/PLATELET
Basophils Absolute: 0.1 10*3/uL (ref 0.0–0.1)
Basophils Relative: 0.8 % (ref 0.0–3.0)
Eosinophils Absolute: 0 10*3/uL (ref 0.0–0.7)
Eosinophils Relative: 0.6 % (ref 0.0–5.0)
HCT: 42.4 % (ref 36.0–46.0)
Hemoglobin: 15.1 g/dL — ABNORMAL HIGH (ref 12.0–15.0)
Lymphocytes Relative: 25.4 % (ref 12.0–46.0)
Lymphs Abs: 1.9 10*3/uL (ref 0.7–4.0)
MCHC: 35.5 g/dL (ref 30.0–36.0)
MCV: 93.5 fl (ref 78.0–100.0)
Monocytes Absolute: 0.8 10*3/uL (ref 0.1–1.0)
Monocytes Relative: 9.8 % (ref 3.0–12.0)
Neutro Abs: 4.9 10*3/uL (ref 1.4–7.7)
Neutrophils Relative %: 63.4 % (ref 43.0–77.0)
Platelets: 136 10*3/uL — ABNORMAL LOW (ref 150.0–400.0)
RBC: 4.54 Mil/uL (ref 3.87–5.11)
RDW: 13.1 % (ref 11.5–15.5)
WBC: 7.7 10*3/uL (ref 4.0–10.5)

## 2021-04-09 LAB — TSH: TSH: 2.49 u[IU]/mL (ref 0.35–5.50)

## 2021-04-09 MED ORDER — LOSARTAN POTASSIUM 100 MG PO TABS: 100.0000 mg | ORAL_TABLET | Freq: Every day | ORAL | 1 refills | Status: DC

## 2021-04-09 MED ORDER — MELOXICAM 15 MG PO TABS: 15.0000 mg | ORAL_TABLET | Freq: Every day | ORAL | 3 refills | Status: DC

## 2021-04-09 MED ORDER — HYDROCHLOROTHIAZIDE 50 MG PO TABS: ORAL_TABLET | ORAL | 1 refills | Status: DC

## 2021-04-09 NOTE — Progress Notes (Signed)
/   [name] , February 26, 1951, 70 y.o., female MRN: [id number] Patient Care Team    Relationship Specialty Notifications Start End  [name], DO PCP - General Family Medicine  01/04/16   [name], DPM Consulting Physician Podiatry  01/12/16     Chief Complaint  Patient presents with   Hypertension    [place]; pt is fasting    Subjective: Pt presents for routine OV follow up on [facility] Hypertension/morbid obesity/hyperlipidemia: Pt reports compliance with Cozaar 100  QD and HCTZ 50 qd. Patient denies chest pain, shortness of breath, dizziness or lower extremity edema.  Pt takes daily ASA. Pt is not prescribed statin. Labs due Exercise: routine exercise.  RF: Hypertension, history of TIA, smoker, family history of heart disease  Arthritis: Patient reports compliance with  meloxicam and it has been working well.   She denies any swelling, bruising or bleeding.   Depression screen [facility] 2/9 04/09/2021 04/12/2020 02/25/2020 02/10/2019 02/09/2018  Decreased Interest 0 0 0 0 0  Down, Depressed, Hopeless 0 0 0 0 0  PHQ - 2 Score 0 0 0 0 0  Altered sleeping - - - - -  Tired, decreased energy - - - - -  Change in appetite - - - - -  Feeling bad or failure about yourself  - - - - -  Trouble concentrating - - - - -  Moving slowly or fidgety/restless - - - - -  Suicidal thoughts - - - - -  PHQ-9 Score - - - - -   GAD 7 : Generalized Anxiety Score 01/08/2017  Nervous, Anxious, on Edge 2  Control/stop worrying 2  Worry too much - different things 0  Trouble relaxing 0  Restless 0  Easily annoyed or irritable 0  Afraid - awful might happen 0  Total GAD 7 Score 4  Anxiety Difficulty Very difficult    Allergies  Allergen Reactions   Sulfa Antibiotics    Social History   Tobacco Use   Smoking status: Every Day    Packs/day: 1.00    Years: 3.00    Pack years: 3.00    Types: Cigarettes   Smokeless tobacco: Never  Substance Use Topics   Alcohol use: No    Alcohol/week:  0.0 standard drinks   Past Medical History:  Diagnosis Date   Allergic rhinitis    Astigmatism    right eye   Back pain    Dr. 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   Posterior vitreous detachment, both eyes    Postmenopausal    Pseudophakia of left eye    Psoriasis 2009   Sciatica of left side 05/2013   TIA (transient ischemic attack) 2000   Urinary incontinence    Past Surgical History:  Procedure Laterality Date   CHOLECYSTECTOMY  11/2001  LUMBAR DISC SURGERY  04/2005   R L4-5  and L5-S1   SKIN BIOPSY Left 1999   left hand bcc vs scc   TONSILLECTOMY AND ADENOIDECTOMY     Family History  Problem Relation Age of Onset   Rheum arthritis Mother    Alcohol abuse Mother    Heart disease Father 68       MI at 41; died   Diabetes Father    Dementia Maternal Grandmother    Allergies as of 04/09/2021       Reactions   Sulfa Antibiotics         Medication List        Accurate as of April 09, 2021  9:58 AM. If you have any questions, ask your nurse or doctor.          aspirin EC 325 MG tablet Take 325 mg by mouth daily.   hydrochlorothiazide 50 MG tablet Commonly known as: HYDRODIURIL TAKE 1 TABLET(50 MG) BY MOUTH DAILY   losartan 100 MG tablet Commonly known as: COZAAR Take 1 tablet (100 mg total) by mouth daily.   meloxicam 15 MG  tablet Commonly known as: MOBIC Take 1 tablet (15 mg total) by mouth daily.   MULTIVITAMIN ADULT PO Take 1 Dose by mouth.   NON FORMULARY   Omega 3 1000 MG Caps Take by mouth.   tolterodine 4 MG 24 hr capsule Commonly known as: DETROL LA Take 4 mg by mouth daily.        No results found for this or any previous visit (from the past 24 hour(s)). No results found.   ROS: Negative, with the exception of above mentioned in HPI  Objective:  BP 125/85   Pulse 88   Temp 98.5 F (36.9 C) (Oral)   Ht 5\' 3"  (1.6 m)   Wt 148 lb (67.1 kg)   SpO2 98%   BMI 26.22 kg/m  Body mass index is 26.22 kg/m. Gen: Afebrile. No acute distress. Nontoxic pleasant female.  HENT: AT. [place].  Eyes:Pupils Equal Round Reactive to light, Extraocular movements intact,  Conjunctiva without redness, discharge or icterus. Neck/lymp/endocrine: Supple,no  lymphadenopathy CV: RRR no murmur, no edema, +2/4 P posterior tibialis pulses Chest: CTAB, no wheeze or crackles Skin: no rashes, purpura or petechiae.  Neuro: Normal gait. PERLA. EOMi. Alert. Oriented x3 Psych: Normal affect, dress and demeanor. Normal speech. Normal thought content and judgment..   No results found for this or any previous visit (from the past 24 hour(s)).  Assessment/Plan: JULISA FLIPPO is a 70 y.o. female present for OV for  Essential hypertension, benign/morbid obesity/hyperlipidemia/history of TIA - stable.  - continue losartan 100  - continue  HCTZ 50 mg daily. - continue ASA 325 - cbc, cmp, tsh, lipid.  - low sodium, exercise.  - Follow-up 5.5 months  Arthritis:  - stable.  - continue mobic 15 mg daily    Return in about 24 weeks (around 09/24/2021) for CMC (30 min). Orders Placed This Encounter  Procedures   CBC with Differential/Platelet   Comprehensive metabolic panel   Lipid panel   TSH    Meds ordered this encounter  Medications   hydrochlorothiazide (HYDRODIURIL) 50 MG tablet    Sig: TAKE 1 TABLET(50  MG) BY MOUTH DAILY    Dispense:  90 tablet    Refill:  1   losartan (COZAAR) 100 MG tablet    Sig: Take 1 tablet (100 mg total) by mouth daily.    Dispense:  90 tablet    Refill:  1   meloxicam (MOBIC) 15 MG tablet    Sig: Take 1 tablet (15 mg total) by mouth daily.    Dispense:  90 tablet    Refill:  3       electronically signed by:  Howard Pouch, DO  [place]

## 2021-04-09 NOTE — Patient Instructions (Signed)
Great to see you today.  I have refilled the medication(s) we provide.   If labs were collected, we will inform you of lab results once received either by echart message or telephone call.   - echart message- for normal results that have been seen by the patient already.   - telephone call: abnormal results or if patient has not viewed results in their echart.  

## 2021-04-18 ENCOUNTER — Ambulatory Visit: Payer: Medicare Other

## 2021-07-26 ENCOUNTER — Other Ambulatory Visit: Payer: Self-pay | Admitting: *Deleted

## 2021-07-26 DIAGNOSIS — Z87891 Personal history of nicotine dependence: Secondary | ICD-10-CM

## 2021-07-26 DIAGNOSIS — F17210 Nicotine dependence, cigarettes, uncomplicated: Secondary | ICD-10-CM

## 2021-08-08 ENCOUNTER — Ambulatory Visit (HOSPITAL_BASED_OUTPATIENT_CLINIC_OR_DEPARTMENT_OTHER): Payer: Medicare Other

## 2021-08-08 ENCOUNTER — Ambulatory Visit (INDEPENDENT_AMBULATORY_CARE_PROVIDER_SITE_OTHER): Payer: Medicare Other

## 2021-08-08 ENCOUNTER — Other Ambulatory Visit: Payer: Self-pay

## 2021-08-08 DIAGNOSIS — F17210 Nicotine dependence, cigarettes, uncomplicated: Secondary | ICD-10-CM

## 2021-08-08 DIAGNOSIS — Z87891 Personal history of nicotine dependence: Secondary | ICD-10-CM

## 2021-08-16 ENCOUNTER — Telehealth: Payer: Self-pay | Admitting: Acute Care

## 2021-08-16 ENCOUNTER — Telehealth: Payer: Self-pay | Admitting: Family Medicine

## 2021-08-16 ENCOUNTER — Other Ambulatory Visit: Payer: Self-pay | Admitting: Acute Care

## 2021-08-16 DIAGNOSIS — F17210 Nicotine dependence, cigarettes, uncomplicated: Secondary | ICD-10-CM

## 2021-08-16 DIAGNOSIS — Z87891 Personal history of nicotine dependence: Secondary | ICD-10-CM

## 2021-08-16 DIAGNOSIS — I77810 Thoracic aortic ectasia: Secondary | ICD-10-CM

## 2021-08-16 DIAGNOSIS — M5431 Sciatica, right side: Secondary | ICD-10-CM

## 2021-08-16 NOTE — Telephone Encounter (Signed)
I have called [name] with the results of her low-dose CT.  I explained that her scan was read as a lung RADS 2, annual screening recommended by radiology. There was also notation of the ascending aorta measuring 4.2 cm.  On last year scan the measurement was 4.0 cm.  Recommendation is for semiannual follow-up screening.  I will refer to thoracic surgery for evaluation.  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 Denise please place order for annual screening scan, fax results to PCP, and let her know that I am referring her to thoracic surgery for a 4.2 cm ascending aorta.  Patient does have hypertension however she takes medication and stated that it is well controlled

## 2021-08-16 NOTE — Telephone Encounter (Signed)
FYI: Pt called about a referral to [facility] in [place].   I informed pt that Dr. Raliegh Ip will not authorize a referral without a visit.  Pt had a referral to that location back in 2019 for sciatica.  Told pt she has to schedule a visit with [place]. Pt last visit was in September of last year.-KR

## 2021-08-16 NOTE — Telephone Encounter (Signed)
Received voicemail from patient requesting a call back to discuss LDCT results.

## 2021-08-16 NOTE — Telephone Encounter (Signed)
Noted. FYI sent to PCP.

## 2021-08-16 NOTE — Addendum Note (Signed)
Addended by: Joella Prince on: 08/16/2021 12:56 PM   Modules accepted: Orders

## 2021-08-17 ENCOUNTER — Telehealth: Payer: Self-pay

## 2021-08-17 NOTE — Telephone Encounter (Signed)
-----   Message from Tana Coast sent at 08/17/2021 10:29 AM EST ----- Regarding: FW: New Referral Can you get the order placed for this patient See below  Rodena Piety  ----- Message ----- From: Ned Clines Sent: 08/17/2021   9:38 AM EST To: Magdalen Spatz, NP, Tana Coast, # Subject: RE: New Referral                               Please order and schedule CTA Chest before appointment here at our office. We will need those images for the appointment.  Appointment set up for 2/20.thanks  Jenny Reichmann ----- Message ----- From: Tana Coast Sent: 08/16/2021   1:36 PM EST To: Ned Clines Subject: New Referral                                   Eric Form placed new referral for Dr. Ala Bent

## 2021-08-17 NOTE — Telephone Encounter (Signed)
Message sent back to [facility] to clarify if CT angio is needed for Venogram.

## 2021-08-20 ENCOUNTER — Other Ambulatory Visit: Payer: Self-pay | Admitting: Acute Care

## 2021-08-20 DIAGNOSIS — I719 Aortic aneurysm of unspecified site, without rupture: Secondary | ICD-10-CM

## 2021-08-20 NOTE — Telephone Encounter (Signed)
Referral placed. Pt aware. 

## 2021-08-20 NOTE — Addendum Note (Signed)
Addended by: Kavin Leech on: 08/20/2021 04:58 PM   Modules accepted: Orders

## 2021-08-20 NOTE — Telephone Encounter (Signed)
Ok to place referral.

## 2021-08-28 ENCOUNTER — Other Ambulatory Visit: Payer: Self-pay | Admitting: *Deleted

## 2021-08-28 DIAGNOSIS — I10 Essential (primary) hypertension: Secondary | ICD-10-CM

## 2021-08-29 ENCOUNTER — Telehealth: Payer: Self-pay | Admitting: Acute Care

## 2021-08-29 DIAGNOSIS — I10 Essential (primary) hypertension: Secondary | ICD-10-CM | POA: Diagnosis not present

## 2021-08-29 NOTE — Telephone Encounter (Signed)
Spoke with [name] and patient.  Order is visible in Epic but not seen by [facility].  Order has now been faxed.  Patient advised to call back if further problems

## 2021-09-03 ENCOUNTER — Other Ambulatory Visit: Payer: Medicare Other

## 2021-09-17 ENCOUNTER — Emergency Department (INDEPENDENT_AMBULATORY_CARE_PROVIDER_SITE_OTHER)
Admission: EM | Admit: 2021-09-17 | Discharge: 2021-09-17 | Disposition: A | Discharge status: Home / Self Care | Payer: Medicare Other | Source: Home / Self Care

## 2021-09-17 ENCOUNTER — Other Ambulatory Visit: Payer: Self-pay

## 2021-09-17 ENCOUNTER — Ambulatory Visit (INDEPENDENT_AMBULATORY_CARE_PROVIDER_SITE_OTHER): Payer: Medicare Other

## 2021-09-17 DIAGNOSIS — I719 Aortic aneurysm of unspecified site, without rupture: Secondary | ICD-10-CM

## 2021-09-17 DIAGNOSIS — M62830 Muscle spasm of back: Secondary | ICD-10-CM

## 2021-09-17 DIAGNOSIS — M5431 Sciatica, right side: Secondary | ICD-10-CM | POA: Diagnosis not present

## 2021-09-17 LAB — I-STAT CREATININE (MANUAL ENTRY): Creatinine, Ser: 0.8 (ref 0.50–1.10)

## 2021-09-17 MED ORDER — BACLOFEN 10 MG PO TABS: 10.0000 mg | ORAL_TABLET | Freq: Three times a day (TID) | ORAL | 0 refills | Status: DC

## 2021-09-17 MED ORDER — PREDNISONE 20 MG PO TABS: ORAL_TABLET | ORAL | 0 refills | Status: DC

## 2021-09-17 MED ORDER — METHYLPREDNISOLONE SODIUM SUCC 125 MG IJ SOLR
125.0000 mg | Freq: Once | INTRAMUSCULAR | Status: AC
  Administered 2021-09-17: 125 mg via INTRAMUSCULAR

## 2021-09-17 MED ORDER — IOHEXOL 350 MG/ML SOLN
100.0000 mL | Freq: Once | INTRAVENOUS | Status: AC | PRN
  Administered 2021-09-17: 100 mL via INTRAVENOUS

## 2021-09-17 NOTE — ED Triage Notes (Signed)
Pt states that she has some back pain that radiates down her right leg. X2 months ? ?Pt states that she does have a history of back pain and has had surgery 16 years ago. ? ?Pt states that she has done a lot of traveling over the past 2 months. ?

## 2021-09-17 NOTE — Discharge Instructions (Addendum)
Advised patient to take medication as directed with food to completion.  Advised patient to start oral Prednisone taper tomorrow, Tuesday, 09/18/2021.  Advised patient may take Baclofen daily or as needed for accompanying muscle spasms of lower back. Encouraged patient to avoid offending activities of right lower back including pushing, pulling, and/or lifting for the next 7 to 10 days.  Encouraged patient to increase daily water intake while taking these medications. ?

## 2021-09-17 NOTE — ED Provider Notes (Signed)
[facility]    CSN: [id number] Arrival date & time: 09/17/21  1133      History   Chief Complaint Chief Complaint  Patient presents with   Back Pain    Back pain that radiates down right leg. X2 months    HPI [name] is a 71 y.o. female.   HPI 71 year old female presents with lower back pain that radiates down right leg for 2 months.  Patient reports history of lower back pain with surgery 16 years ago.  Patient reports that she has been traveling a lot for the past 2 months, including recent 15-hour plane trip from [place].Marland Kitchen  PMH significant for TIA, demyelinating disease, and BCC.   Past Medical History:  Diagnosis Date   Allergic rhinitis    Astigmatism    right eye   Back pain    Dr. 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   Posterior vitreous detachment, both eyes    Postmenopausal    Pseudophakia of left eye    Psoriasis 2009   Sciatica of left side 05/2013   TIA (transient ischemic attack) 2000   Urinary incontinence     Patient Active Problem List   Diagnosis Date Noted   Current smoker 11/29/2020   Cough 11/29/2020   Elevated bilirubin 02/29/2020   Combined forms of age-related cataract of right eye 06/03/2019   Regular astigmatism, right eye 06/03/2019   Overweight (BMI 25.0-29.9) 02/10/2019   Bilateral impacted cerumen 02/10/2019    Tinnitus of left ear 02/10/2019   Arthritis 08/20/2018   Essential hypertension, benign 01/11/2016   History of transient ischemic attack 01/11/2016   Mixed hyperlipidemia 01/11/2016    Past Surgical History:  Procedure Laterality Date   CHOLECYSTECTOMY  11/2001   LUMBAR [place] SURGERY  04/2005   R L4-5  and L5-S1   SKIN BIOPSY Left 1999   left hand bcc vs scc   TONSILLECTOMY AND ADENOIDECTOMY      OB History     Gravida  2   Para  2   Term      Preterm      AB      Living  2      SAB      IAB      Ectopic      Multiple      Live Births               Home Medications    Prior to Admission medications   Medication Sig Start Date End Date Taking? Authorizing Provider  aspirin EC 325 MG tablet Take 325 mg by mouth daily.   Yes [provider]  baclofen (LIORESAL) 10 MG tablet Take 1 tablet (10 mg total) by mouth 3 (three) times daily. 09/17/21  Yes Eliezer Lofts, FNP  hydrochlorothiazide (HYDRODIURIL) 50 MG  tablet TAKE 1 TABLET(50 MG) BY MOUTH DAILY 04/09/21  Yes Kuneff, Renee A, DO  losartan (COZAAR) 100 MG tablet Take 1 tablet (100 mg total) by mouth daily. 04/09/21  Yes Kuneff, Renee A, DO  meloxicam (MOBIC) 15 MG tablet Take 1 tablet (15 mg total) by mouth daily. 04/09/21  Yes Kuneff, Renee A, DO  Multiple Vitamins-Minerals (MULTIVITAMIN ADULT PO) Take 1 Dose by mouth.   Yes [provider]  Omega 3 1000 MG CAPS Take by mouth.   Yes [provider]  predniSONE (DELTASONE) 20 MG tablet Take 3 tabs PO daily x 3 days, then 2 tabs PO daily x 3 days, then 1 tab PO daily x 3 days 09/17/21  Yes Eliezer Lofts, FNP  tolterodine (DETROL LA) 4 MG 24 hr capsule Take 4 mg by mouth daily. 10/05/20  Yes [provider]  NON FORMULARY     [provider]    Family History Family History  Problem Relation Age of Onset   Rheum arthritis Mother    Alcohol abuse Mother    Heart disease Father 46       MI at 70; died    Diabetes Father    Dementia Maternal Grandmother     Social History Social History   Tobacco Use   Smoking status: Every Day    Packs/day: 1.00    Years: 3.00    Pack years: 3.00    Types: Cigarettes   Smokeless tobacco: Never  Vaping Use   Vaping Use: Never used  Substance Use Topics   Alcohol use: No    Alcohol/week: 0.0 standard drinks   Drug use: No     Allergies   Sulfa antibiotics   Review of Systems Review of Systems  Musculoskeletal:  Positive for back pain.  All other systems reviewed and are negative.   Physical Exam Triage Vital Signs ED Triage Vitals  Enc Vitals Group     BP 09/17/21 1246 (!) 141/89     Pulse Rate 09/17/21 1246 86     Resp 09/17/21 1246 18     Temp 09/17/21 1246 98 F (36.7 C)     Temp Source 09/17/21 1246 Oral     SpO2 09/17/21 1246 97 %     Weight 09/17/21 1243 148 lb (67.1 kg)     Height 09/17/21 1243 '5\' 4"'$  (1.626 m)     Head Circumference --      Peak Flow --      Pain Score 09/17/21 1243 9     Pain Loc --      Pain Edu? --      Excl. in [place]? --    No data found.  Updated Vital Signs BP (!) 141/89 (BP Location: Left Arm)    Pulse 86    Temp 98 F (36.7 C) (Oral)    Resp 18    Ht '5\' 4"'$  (1.626 m)    Wt 148 lb (67.1 kg)    SpO2 97%    BMI 25.40 kg/m   Physical Exam Vitals and nursing note reviewed.  Constitutional:      General: She is not in acute distress.    Appearance: Normal appearance. She is normal weight. She is not ill-appearing.  HENT:     Head: Normocephalic and atraumatic.     Right Ear: Tympanic membrane, ear canal and external ear normal.     Left Ear: Tympanic membrane, ear canal and external ear normal.     Mouth/Throat:  Mouth: Mucous membranes are moist.     Pharynx: Oropharynx is clear.  Eyes:     Extraocular Movements: Extraocular movements intact.     Conjunctiva/sclera: Conjunctivae normal.     Pupils: Pupils are equal, round, and reactive to light.  Cardiovascular:     Rate and Rhythm:  Normal rate and regular rhythm.     Pulses: Normal pulses.     Heart sounds: Normal heart sounds.  Pulmonary:     Effort: Pulmonary effort is normal.     Breath sounds: Normal breath sounds.  Musculoskeletal:     Cervical back: Normal range of motion and neck supple.     Comments: Lower back (right-sided inferior aspect): TTP over spinous processes, right-sided paraspinous muscles and inferior spinal erectors, with palpable muscle adhesions noted  Skin:    General: Skin is warm and dry.  Neurological:     General: No focal deficit present.     Mental Status: She is alert and oriented to person, place, and time. Mental status is at baseline.     UC Treatments / Results  Labs (all labs ordered are listed, but only abnormal results are displayed) Labs Reviewed - No data to display  EKG   Radiology No results found.  Procedures Procedures (including critical care time)  Medications Ordered in UC Medications  methylPREDNISolone sodium succinate (SOLU-MEDROL) 125 mg/2 mL injection 125 mg (125 mg Intramuscular Given 09/17/21 1352)    Initial Impression / Assessment and Plan / UC Course  I have reviewed the triage vital signs and the nursing notes.  Pertinent labs & imaging results that were available during my care of the patient were reviewed by me and considered in my medical decision making (see chart for details).     MDM: 1.  Sciatica of right side-IM Solu-Medrol 125 mg given once in clinic, Rx'd Prednisone taper; 2.  Muscle spasm of back-Rx'd Baclofen. Advised patient to take medication as directed with food to completion.  Advised patient to start oral Prednisone taper tomorrow, Tuesday, 09/18/2021.  Advised patient may take Baclofen daily or as needed for accompanying muscle spasms of lower back. Encouraged patient to avoid offending activities of right lower back including pushing, pulling, and/or lifting for the next 7 to 10 days.  Encouraged patient to increase daily water  intake while taking these medications.  Patient discharged home, hemodynamically stable Final Clinical Impressions(s) / UC Diagnoses   Final diagnoses:  Sciatica of right side  Muscle spasm of back     Discharge Instructions      Advised patient to take medication as directed with food to completion.  Advised patient to start oral Prednisone taper tomorrow, Tuesday, 09/18/2021.  Advised patient may take Baclofen daily or as needed for accompanying muscle spasms of lower back. Encouraged patient to avoid offending activities of right lower back including pushing, pulling, and/or lifting for the next 7 to 10 days.  Encouraged patient to increase daily water intake while taking these medications.     ED Prescriptions     Medication Sig Dispense Auth. Provider   predniSONE (DELTASONE) 20 MG tablet Take 3 tabs PO daily x 3 days, then 2 tabs PO daily x 3 days, then 1 tab PO daily x 3 days 18 tablet Eliezer Lofts, FNP   baclofen (LIORESAL) 10 MG tablet Take 1 tablet (10 mg total) by mouth 3 (three) times daily. 3 each Eliezer Lofts, FNP      PDMP not reviewed this encounter.   Eliezer Lofts,  FNP 09/17/21 1405

## 2021-09-19 ENCOUNTER — Other Ambulatory Visit: Payer: Self-pay

## 2021-09-20 ENCOUNTER — Encounter (HOSPITAL_BASED_OUTPATIENT_CLINIC_OR_DEPARTMENT_OTHER): Payer: Self-pay

## 2021-09-20 ENCOUNTER — Other Ambulatory Visit: Payer: Self-pay

## 2021-09-20 ENCOUNTER — Emergency Department (HOSPITAL_BASED_OUTPATIENT_CLINIC_OR_DEPARTMENT_OTHER)
Admission: EM | Admit: 2021-09-20 | Discharge: 2021-09-20 | Disposition: A | Discharge status: Home / Self Care | Payer: Medicare Other | Attending: Emergency Medicine | Admitting: Emergency Medicine

## 2021-09-20 DIAGNOSIS — Z7982 Long term (current) use of aspirin: Secondary | ICD-10-CM | POA: Diagnosis not present

## 2021-09-20 DIAGNOSIS — M5441 Lumbago with sciatica, right side: Secondary | ICD-10-CM | POA: Diagnosis not present

## 2021-09-20 DIAGNOSIS — M545 Low back pain, unspecified: Secondary | ICD-10-CM | POA: Diagnosis present

## 2021-09-20 DIAGNOSIS — M5440 Lumbago with sciatica, unspecified side: Secondary | ICD-10-CM

## 2021-09-20 MED ORDER — TRAMADOL HCL 50 MG PO TABS
50.0000 mg | ORAL_TABLET | Freq: Once | ORAL | Status: AC
  Administered 2021-09-20: 13:00:00 50 mg via ORAL
  Filled 2021-09-20: qty 1

## 2021-09-20 NOTE — ED Triage Notes (Signed)
Pt arrives to ED with c/o pain to lower back with radiation into right leg. States that she was seen Monday at [facility] and reports that she got a shot and was started on a steroid. Reports this worked for a few days but the pain got worse today. Pt has had back surgery in the past. Pain has been getting progressively worse for 3 weeks after a long flight from [place].  ?

## 2021-09-20 NOTE — ED Provider Notes (Signed)
?[facility] ?Provider Note ? ? ?CSN: [id number] ?Arrival date & time: 09/20/21  1154 ? ?  ? ?History ? ?Chief Complaint  ?Patient presents with  ? Back Pain  ? ? ?[name] is a 71 y.o. female. ? ?Here with ongoing back pain.  History of the same.  Multiple long travel and other stressors.  Started baclofen and steroids 2 days ago with not much improvement.  No loss of bowel or bladder.  Denies any trauma history.  Denies any weakness or numbness. ? ?The history is provided by the patient.  ?Back Pain ?Location:  Lumbar spine ?Quality:  Aching ?Pain severity:  Mild ?Onset quality:  Gradual ?Timing:  Intermittent ?Progression:  Waxing and waning ?Chronicity:  Recurrent ?Relieved by:  Nothing ?Worsened by:  Nothing ?Ineffective treatments: Baclofen and steroids. ?Associated symptoms: no abdominal pain, no abdominal swelling, no bladder incontinence, no bowel incontinence, no chest pain, no dysuria, no fever, no headaches, no leg pain, no numbness, no paresthesias, no pelvic pain, no perianal numbness, no tingling, no weakness and no weight loss   ? ?  ? ?Home Medications ?Prior to Admission medications   ?Medication Sig Start Date End Date Taking? Authorizing Provider  ?aspirin EC 325 MG tablet Take 325 mg by mouth daily.    [provider]  ?baclofen (LIORESAL) 10 MG tablet Take 1 tablet (10 mg total) by mouth 3 (three) times daily. 09/17/21   Eliezer Lofts, FNP  ?hydrochlorothiazide (HYDRODIURIL) 50 MG tablet TAKE 1 TABLET(50 MG) BY MOUTH DAILY 04/09/21   Kuneff, Renee A, DO  ?losartan (COZAAR) 100 MG tablet Take 1 tablet (100 mg total) by mouth daily. 04/09/21   Kuneff, Renee A, DO  ?meloxicam (MOBIC) 15 MG tablet Take 1 tablet (15 mg total) by mouth daily. 04/09/21   Howard Pouch A, DO  ?Multiple Vitamins-Minerals (MULTIVITAMIN ADULT PO) Take 1 Dose by mouth.    [provider]  ?NON FORMULARY     [provider]  ?Omega 3 1000 MG CAPS Take by mouth.     [provider]  ?predniSONE (DELTASONE) 20 MG tablet Take 3 tabs PO daily x 3 days, then 2 tabs PO daily x 3 days, then 1 tab PO daily x 3 days 09/17/21   Eliezer Lofts, FNP  ?tolterodine (DETROL LA) 4 MG 24 hr capsule Take 4 mg by mouth daily. 10/05/20   [provider]  ?   ? ?Allergies    ?Sulfa antibiotics   ? ?Review of Systems   ?Review of Systems  ?Constitutional:  Negative for fever and weight loss.  ?Cardiovascular:  Negative for chest pain.  ?Gastrointestinal:  Negative for abdominal pain and bowel incontinence.  ?Genitourinary:  Negative for bladder incontinence, dysuria and pelvic pain.  ?Musculoskeletal:  Positive for back pain.  ?Neurological:  Negative for tingling, weakness, numbness, headaches and paresthesias.  ? ?Physical Exam ?Updated Vital Signs ?BP (!) 154/107 (BP Location: Right Arm)   Pulse 72   Temp 98.6 ?F (37 ?C) (Oral)   Resp 16   Ht '5\' 4"'$  (1.626 m)   Wt 65.8 kg   SpO2 97%   BMI 24.89 kg/m?  ?Physical Exam ?Vitals and nursing note reviewed.  ?Constitutional:   ?   General: She is not in acute distress. ?   Appearance: She is well-developed.  ?HENT:  ?   Head: Normocephalic and atraumatic.  ?Cardiovascular:  ?   Rate and Rhythm: Normal rate and regular rhythm.  ?  Pulses: Normal pulses.  ?   Heart sounds: No murmur heard. ?Pulmonary:  ?   Effort: Pulmonary effort is normal.  ?Abdominal:  ?   Palpations: Abdomen is soft.  ?   Tenderness: There is no abdominal tenderness.  ?Musculoskeletal:     ?   General: Tenderness present. No swelling.  ?   Cervical back: Normal range of motion and neck supple. No tenderness.  ?   Comments: No midline spinal tenderness, tenderness to the paraspinal muscles of the lumbar on the right and right gluteal  ?Skin: ?   General: Skin is warm and dry.  ?   Capillary Refill: Capillary refill takes less than 2 seconds.  ?Neurological:  ?   General: No focal deficit present.  ?   Mental Status: She is alert.  ?   Comments: 5+ out of 5  strength throughout, normal sensation  ?Psychiatric:     ?   Mood and Affect: Mood normal.  ? ? ?ED Results / Procedures / Treatments   ?Labs ?(all labs ordered are listed, but only abnormal results are displayed) ?Labs Reviewed - No data to display ? ?EKG ?None ? ?Radiology ?No results found. ? ?Procedures ?Procedures  ? ? ?Medications Ordered in ED ?Medications  ?traMADol (ULTRAM) tablet 50 mg (has no administration in time range)  ? ? ?ED Course/ Medical Decision Making/ A&P ?  ?                        ?Medical Decision Making ?Risk ?Prescription drug management. ? ? ?[name] is here with ongoing back pain.  History of stroke, sciatica, reflux.  Patient currently on baclofen and steroids for the last 2 days with some improvement.  Was given the steroid shot several days ago with good improvement of her symptoms over the last 2 days.  Woke up feeling more discomfort.  Took her tramadol for breakthrough pain and has helped some.  She denies any loss of bowel or bladder.  She has no weakness in her legs.  No objective numbness.  Some paresthesias in her legs at times.  Worse on the right side.  She has good pulses on exam.  She has good strength and sensation on exam.  She is tender mostly in the right-sided paraspinal muscles and right gluteal muscles.  Overall I have no concern for cauda equina or acute spinal cord process.  Differential diagnosis is likely sciatica versus disc herniation.  She used to follow with a spine doctor in the past but does not have one now.  Pain has been on and off for several weeks.  She has tried some massage without much improvement.  Overall we will refer her to spine doctor as well as sports medicine.  Do not think she needs emergent MRI at this time given her symptoms.  Recommend that she give the baclofen steroid more time to take effect.  She has tramadol for breakthrough pain.  Overall gave patient reassurance.  She denies any trauma.  No concern for fracture at this  time.  Discharged in good condition. ? ?This chart was dictated using voice recognition software.  Despite best efforts to proofread,  errors can occur which can change the documentation meaning.  ? ? ? ? ? ? ? ?Final Clinical Impression(s) / ED Diagnoses ?Final diagnoses:  ?Acute right-sided low back pain with sciatica, sciatica laterality unspecified  ? ? ?Rx / DC Orders ?ED Discharge Orders   ? ?  None  ? ?  ? ? ?  ?Lennice Sites, DO ?09/20/21 1225 ? ?

## 2021-09-20 NOTE — Discharge Instructions (Signed)
Continue baclofen and steroids at home.  Continue tramadol for breakthrough pain.  Recommend 1000 mg of Tylenol every 6 hours as needed for pain as well.  Recommend 400 mg ibuprofen every 8 hours as needed as well.  Follow-up with neurosurgeon Dr. Zada Finders.  Follow-up with sports medicine as well with Dr. Raeford Razor.  Overall suspect that you have a pinched nerve or possibly mildly herniated disc.  I have no concern for acute spinal cord compression at this time. ?

## 2021-09-24 ENCOUNTER — Ambulatory Visit: Payer: Medicare Other | Admitting: Family Medicine

## 2021-09-24 ENCOUNTER — Ambulatory Visit (INDEPENDENT_AMBULATORY_CARE_PROVIDER_SITE_OTHER): Payer: Medicare Other | Admitting: Family Medicine

## 2021-09-24 ENCOUNTER — Encounter: Payer: Self-pay | Admitting: Family Medicine

## 2021-09-24 ENCOUNTER — Other Ambulatory Visit: Payer: Self-pay

## 2021-09-24 VITALS — BP 141/93 | HR 68 | Temp 98.3°F | Ht 64.0 in | Wt 152.0 lb

## 2021-09-24 DIAGNOSIS — M545 Low back pain, unspecified: Secondary | ICD-10-CM | POA: Diagnosis not present

## 2021-09-24 DIAGNOSIS — M5416 Radiculopathy, lumbar region: Secondary | ICD-10-CM

## 2021-09-24 DIAGNOSIS — Z9889 Other specified postprocedural states: Secondary | ICD-10-CM

## 2021-09-24 DIAGNOSIS — R2990 Unspecified symptoms and signs involving the nervous system: Secondary | ICD-10-CM

## 2021-09-24 HISTORY — DX: Other specified postprocedural states: Z98.890

## 2021-09-24 HISTORY — DX: Low back pain, unspecified: M54.50

## 2021-09-24 HISTORY — DX: Radiculopathy, lumbar region: M54.16

## 2021-09-24 MED ORDER — HYDROCODONE-ACETAMINOPHEN 5-325 MG PO TABS: 1.0000 | ORAL_TABLET | Freq: Four times a day (QID) | ORAL | 0 refills | Status: DC | PRN

## 2021-09-24 MED ORDER — GABAPENTIN 100 MG PO CAPS: 100.0000 mg | ORAL_CAPSULE | Freq: Three times a day (TID) | ORAL | 1 refills | Status: DC

## 2021-09-24 MED ORDER — TIZANIDINE HCL 4 MG PO TABS: 2.0000 mg | ORAL_TABLET | Freq: Three times a day (TID) | ORAL | 0 refills | Status: DC | PRN

## 2021-09-24 NOTE — Patient Instructions (Signed)

## 2021-09-24 NOTE — Patient Instructions (Signed)
I have called in a new muscle relaxer called zanaflex , pain med and gabapentin.  ? ?Start these today.  ?We will get the MRI ordered for you (hopefully). ? ?Follow up with neuro Monday.  ? ? ?Radicular Pain ?Radicular pain is a type of pain that spreads from your back or neck along a spinal nerve. Spinal nerves are nerves that leave the spinal cord and go to the muscles. Radicular pain is sometimes called radiculopathy, radiculitis, or a pinched nerve. When you have this type of pain, you may also have weakness, numbness, or tingling in the area of your body that is supplied by the nerve. The pain may feel sharp and burning. Depending on which spinal nerve is affected, the pain may occur in the: ?Neck area (cervical radicular pain). You may also feel pain, numbness, weakness, or tingling in the arms. ?Mid-spine area (thoracic radicular pain). You would feel this pain in the back and chest. This type is rare. ?Lower back area (lumbar radicular pain). You would feel this pain as low back pain. You may feel pain, numbness, weakness, or tingling in the buttocks or legs. Sciatica is a type of lumbar radicular pain that shoots down the back of the leg. ?Radicular pain occurs when one of the spinal nerves becomes irritated or squeezed (compressed). It is often caused by something pushing on a spinal nerve, such as one of the bones of the spine (vertebrae) or one of the round cushions between vertebrae (intervertebral disks). This can result from: ?An injury. ?[place] and tear or aging of a disk. ?The growth of a bone spur that pushes on the nerve. ?Radicular pain often goes away when you follow instructions from your health care provider for relieving pain at home. ?How is this treated? ?Treatment may depend on the cause of the condition and may include: ?Working with a physical therapist. ?Taking pain medicine. ?Applying heat or ice or both to the affected areas. ?Doing stretches to improve flexibility. ?Having surgery.  This may be needed if other treatments do not help. Different types of surgery may be done depending on the cause of this condition. ?Follow these instructions at home: ?Managing pain ?  ?If directed, put ice on the affected area. To do this: ?Put ice in a plastic bag. ?Place a towel between your skin and the bag. ?Leave the ice on for 20 minutes, 2-3 times a day. ?Remove the ice if your skin turns bright red. This is very important. If you cannot feel pain, heat, or cold, you have a greater risk of damage to the area. ?If directed, apply heat to the affected area as often as told by your health care provider. Use the heat source that your health care provider recommends, such as a moist heat pack or a heating pad. ?Place a towel between your skin and the heat source. ?Leave the heat on for 20-30 minutes. ?Remove the heat if your skin turns bright red. This is especially important if you are unable to feel pain, heat, or cold. You have a greater risk of getting burned. ?Activity ?Do not sit or rest in bed for long periods of time. ?Try to stay as active as possible. Ask your health care provider what type of exercise or activity is best for you. ?Avoid activities that make your pain worse, such as bending and lifting. ?You may have to avoid lifting. Ask your health care provider how much you can safely lift. ?Practice using proper technique when lifting items. Proper  lifting technique involves bending your knees and rising up. ?Do strength and range-of-motion exercises only as told by your health care provider or physical therapist. ?General instructions ?Take over-the-counter and prescription medicines only as told by your health care provider. ?Pay attention to any changes in your symptoms. ?Keep all follow-up visits. This is important. ?Contact a health care provider if: ?Your pain and other symptoms get worse. ?Your pain medicine is not helping. ?Your pain has not improved after a few weeks of home care. ?You  have a fever. ?Get help right away if: ?You have severe pain, weakness, or numbness. ?You have difficulty with bladder or bowel control. ?Summary ?Radicular pain is a type of pain that spreads from your back or neck along a spinal nerve. ?When you have radicular pain, you may also have weakness, numbness, or tingling in the area of your body that is supplied by the nerve. ?The pain may feel sharp or burning. ?Radicular pain may be treated with ice, heat, medicines, or physical therapy. ?This information is not intended to replace advice given to you by your health care provider. Make sure you discuss any questions you have with your health care provider. ?Document Revised: 01/04/2021 Document Reviewed: 01/04/2021 ?Elsevier Patient Education ? [place]. ? ?

## 2021-09-24 NOTE — Progress Notes (Unsigned)
Subjective:   [name] is a 71 y.o. female who presents for Medicare Annual (Subsequent) preventive examination.  Review of Systems    ***       Objective:    There were no vitals filed for this visit. There is no height or weight on file to calculate BMI.  Advanced Directives 09/20/2021 04/12/2020 02/10/2019 11/19/2016 01/12/2016  Does Patient Have a Medical Advance Directive? Yes Yes Yes Yes;No Yes  Type of Paramedic of [place];Living will [place];Living will [place];Living will;Out of facility DNR (pink MOST or yellow form) - [place];Living will  Does patient want to make changes to medical advance directive? No - Patient declined - - Yes (MAU/Ambulatory/Procedural Areas - Information given) -  Copy of [place] in Chart? No - copy requested No - copy requested No - copy requested - No - copy requested  Would patient like information on creating a medical advance directive? No - Patient declined - - - -    Current Medications (verified) Outpatient Encounter Medications as of 09/25/2021  Medication Sig   aspirin EC 325 MG tablet Take 325 mg by mouth daily.   baclofen (LIORESAL) 10 MG tablet Take 1 tablet (10 mg total) by mouth 3 (three) times daily.   gabapentin (NEURONTIN) 100 MG capsule Take 1 capsule (100 mg total) by mouth 3 (three) times daily.   hydrochlorothiazide (HYDRODIURIL) 50 MG tablet TAKE 1 TABLET(50 MG) BY MOUTH DAILY   HYDROcodone-acetaminophen (NORCO/VICODIN) 5-325 MG tablet Take 1 tablet by mouth every 6 (six) hours as needed for moderate pain.   losartan (COZAAR) 100 MG tablet Take 1 tablet (100 mg total) by mouth daily.   meloxicam (MOBIC) 15 MG tablet Take 1 tablet (15 mg total) by mouth daily.   Multiple Vitamins-Minerals (MULTIVITAMIN ADULT PO) Take 1 Dose by mouth.   NON FORMULARY    Omega 3 1000 MG CAPS Take by mouth.   predniSONE (DELTASONE) 20 MG  tablet Take 3 tabs PO daily x 3 days, then 2 tabs PO daily x 3 days, then 1 tab PO daily x 3 days   tiZANidine (ZANAFLEX) 4 MG tablet Take 0.5-1 tablets (2-4 mg total) by mouth every 8 (eight) hours as needed for muscle spasms.   tolterodine (DETROL LA) 4 MG 24 hr capsule Take 4 mg by mouth daily.   No facility-administered encounter medications on file as of 09/25/2021.    Allergies (verified) Sulfa antibiotics   History: Past Medical History:  Diagnosis Date   Allergic rhinitis    Astigmatism    right eye   Back pain    Dr. Karin Lieu ([facility])   Basal cell carcinoma    Combined form of age-related cataract, both eyes    Cystocele    Demyelinating disease ([place]) 2003   No definitive workup completed; prior records   Depression    Disorder of refraction    both eyes   Early stage nonexudative age-related macular degeneration of both eyes    Exophoria    Fibrocystic breast    GERD (gastroesophageal reflux disease)    History of echocardiogram 12/2012   Dr. 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   Posterior vitreous detachment, both eyes    Postmenopausal    Pseudophakia of left eye  Psoriasis 2009   Sciatica of left side 05/2013   TIA (transient ischemic attack) 2000   Urinary incontinence    Past Surgical History:  Procedure Laterality Date   CHOLECYSTECTOMY  11/2001   LUMBAR DISC SURGERY  04/2005   R L4-5  and L5-S1   SKIN BIOPSY Left 1999   left hand bcc vs scc   TONSILLECTOMY AND ADENOIDECTOMY     Family History  Problem Relation Age of Onset   Rheum arthritis Mother    Alcohol abuse Mother    Heart disease Father 48       MI at 50; died   Diabetes Father    Dementia Maternal Grandmother    Social History   Socioeconomic History   Marital status: Widowed    Spouse name: Not on file    Number of children: Not on file   Years of education: Not on file   Highest education level: Not on file  Occupational History   Not on file  Tobacco Use   Smoking status: Every Day    Packs/day: 1.00    Years: 3.00    Pack years: 3.00    Types: Cigarettes   Smokeless tobacco: Never  Vaping Use   Vaping Use: Never used  Substance and Sexual Activity   Alcohol use: No    Alcohol/week: 0.0 standard drinks   Drug use: No   Sexual activity: Never  Other Topics Concern   Not on file  Social History Narrative   Widower 2018 (Damon). 2 sons.   College. Retired.    Former smoker (quit 04/2005- 30+ pack year), occasional etoh, no drugs   Drinks caffeine, uses herbal remedies, daily vitamin use   Wears her seatbelt, wears her bicycle helmet   Exercises routinely   Smoke detector in the home.    Firearms in the home (locked)   Feels safe in her relationships.    Social Determinants of Health   Financial Resource Strain: Not on file  Food Insecurity: Not on file  Transportation Needs: Not on file  Physical Activity: Not on file  Stress: Not on file  Social Connections: Not on file    Tobacco Counseling Ready to quit: Not Answered Counseling given: Not Answered   Clinical Intake:                 Diabetic?***         Activities of Daily Living No flowsheet data found.  Patient Care Team: [name], DO as PCP - General (Family Medicine) [name], DPM as Consulting Physician (Podiatry)  Indicate any recent Medical Services you may have received from other than [facility] providers in the past year (date may be approximate).     Assessment:   This is a routine wellness examination for [name].  Hearing/Vision screen No results found.  Dietary issues and exercise activities discussed:     Goals Addressed   None   Depression Screen PHQ 2/9 Scores 09/24/2021 04/09/2021 04/12/2020 02/25/2020 02/10/2019 02/09/2018 12/31/2017  PHQ - 2 Score 0 0  0 0 0 0 0  PHQ- 9 Score - - - - - - -    Fall Risk Fall Risk  09/24/2021 04/09/2021 04/12/2020 02/25/2020 02/10/2019  Falls in the past year? 0 0 0 0 0  Number falls in past yr: 0 0 0 - -  Injury with Fall? 0 0 0 - -  Risk for fall due to : No Fall Risks - - - -  Follow up Falls evaluation completed Falls evaluation completed Falls prevention discussed - Falls prevention discussed;Education provided;Falls evaluation completed    FALL RISK PREVENTION PERTAINING TO THE HOME:  Any stairs in or around the home? {YES/NO:21197} If so, are there any without handrails? {YES/NO:21197} Home free of loose throw rugs in walkways, pet beds, electrical cords, etc? {YES/NO:21197} Adequate lighting in your home to reduce risk of falls? {YES/NO:21197}  ASSISTIVE DEVICES UTILIZED TO PREVENT FALLS:  Life alert? {YES/NO:21197} Use of a cane, walker or w/c? {YES/NO:21197} Grab bars in the bathroom? {YES/NO:21197} Shower chair or bench in shower? {YES/NO:21197} Elevated toilet seat or a handicapped toilet? {YES/NO:21197}  TIMED UP AND GO:  Was the test performed? {YES/NO:21197}.  Length of time to ambulate 10 feet: *** sec.   {Appearance of [id number]  Cognitive Function:     6CIT Screen 04/12/2020  What Year? 0 points  What month? 0 points  What time? 0 points  Count back from 20 0 points  Months in reverse 0 points  Repeat phrase 0 points  Total Score 0    Immunizations Immunization History  Administered Date(s) Administered   Moderna SARS-COV2 Booster Vaccination 11/07/2020, 03/22/2021   Moderna Sars-Covid-2 Vaccination 08/14/2019, 09/13/2019, 05/09/2020   Tdap 12/21/2013   Zoster, Live 06/24/2013    TDAP status: Up to date  Flu Vaccine status: Declined, Education has been provided regarding the importance of this vaccine but patient still declined. Advised may receive this vaccine at local pharmacy or Health Dept. Aware to provide a copy of the vaccination record if obtained from  local pharmacy or Health Dept. Verbalized acceptance and understanding.  Pneumococcal vaccine status: Declined,  Education has been provided regarding the importance of this vaccine but patient still declined. Advised may receive this vaccine at local pharmacy or Health Dept. Aware to provide a copy of the vaccination record if obtained from local pharmacy or Health Dept. Verbalized acceptance and understanding.   Covid-19 vaccine status: Information provided on how to obtain vaccines.   Qualifies for Shingles Vaccine? Yes   Zostavax completed No   Shingrix Completed?: No.    Education has been provided regarding the importance of this vaccine. Patient has been advised to call insurance company to determine out of pocket expense if they have not yet received this vaccine. Advised may also receive vaccine at local pharmacy or Health Dept. Verbalized acceptance and understanding.  Screening Tests Health Maintenance  Topic Date Due   Pneumonia Vaccine 68+ Years old (1 - PCV) Never done   Zoster Vaccines- Shingrix (1 of 2) Never done   COVID-19 Vaccine (4 - Booster for Moderna series) 10/10/2021 (Originally 05/17/2021)   INFLUENZA VACCINE  10/12/2021 (Originally 02/12/2021)   COLONOSCOPY (Pts 45-86yr Insurance coverage will need to be confirmed)  09/25/2022 (Originally 07/10/1994)   TETANUS/TDAP  12/22/2023   DEXA SCAN  Completed   Hepatitis C Screening  Completed   HPV VACCINES  Aged Out   MAMMOGRAM  Discontinued    Health Maintenance  Health Maintenance Due  Topic Date Due   Pneumonia Vaccine 71 Years old (1 - PCV) Never done   Zoster Vaccines- Shingrix (1 of 2) Never done    Colorectal cancer screening: Referral to GI placed ***. Pt aware the office will call re: appt.  Mammogram status: Ordered ***. Pt provided with contact info and advised to call to schedule appt.   Bone Density status: Ordered ***. Pt provided with contact info and advised to call to schedule appt.  Lung  Cancer Screening: (Low Dose CT Chest recommended if Age 57-80 years, 30 pack-year currently smoking OR have quit w/in 15years.) {DOES NOT does:27190::"does not"} qualify.   Lung Cancer Screening Referral: ***  Additional Screening:  Hepatitis C Screening: does qualify; Completed 01/25/2016  Vision Screening: Recommended annual ophthalmology exams for early detection of glaucoma and other disorders of the eye. Is the patient up to date with their annual eye exam?  {YES/NO:21197} Who is the provider or what is the name of the office in which the patient attends annual eye exams? *** If pt is not established with a provider, would they like to be referred to a provider to establish care? {YES/NO:21197}.   Dental Screening: Recommended annual dental exams for proper oral hygiene  Community Resource Referral / Chronic Care Management: CRR required this visit?  {YES/NO:21197}  CCM required this visit?  {YES/NO:21197}     Plan:     I have personally reviewed and noted the following in the patients chart:   Medical and social history Use of alcohol, tobacco or illicit drugs  Current medications and supplements including opioid prescriptions.  Functional ability and status Nutritional status Physical activity Advanced directives List of other physicians Hospitalizations, surgeries, and ER visits in previous 12 months Vitals Screenings to include cognitive, depression, and falls Referrals and appointments  In addition, I have reviewed and discussed with patient certain preventive protocols, quality metrics, and best practice recommendations. A written personalized care plan for preventive services as well as general preventive health recommendations were provided to patient.     Octaviano Glow, CMA   09/24/2021   Nurse Notes: ***

## 2021-09-24 NOTE — Progress Notes (Signed)
This visit occurred during the SARS-CoV-2 public health emergency.  Safety protocols were in place, including screening questions prior to the visit, additional usage of staff PPE, and extensive cleaning of exam room while observing appropriate contact time as indicated for disinfecting solutions.    [name] , [birth year], 71 y.o., female MRN: [id number] Patient Care Team    Relationship Specialty Notifications Start End  [name], DO PCP - General Family Medicine  01/04/16   [name], DPM Consulting Physician Podiatry  01/12/16     Chief Complaint  Patient presents with   Back Pain    Pt c/o LBP that radiates to feet x 2 mos;      Subjective: Pt presents for an OV with complaints of low back pain that radiates down to her right foot.  She states her right foot is numb.  However it is pain that radiates down her leg.  She has lower back pain as well and has difficulty straightening her back.  She is walking bent over.  She has a history of lumbar discectomy at L4-L5 and L5-S1 in 2006.  Her neurosurgeon no longer is practicing.  She has been seen twice in the urgent care over the last week and treated with baclofen muscle relaxer and prednisone.  She states this pain has been ongoing for over 2 months.  She did receive minimal relief after steroid injection in the urgent care.  But since then nothing seems to have worked well for her.  Depression screen [facility] 2/9 09/24/2021 04/09/2021 04/12/2020 02/25/2020 02/10/2019  Decreased Interest 0 0 0 0 0  Down, Depressed, Hopeless 0 0 0 0 0  PHQ - 2 Score 0 0 0 0 0  Altered sleeping - - - - -  Tired, decreased energy - - - - -  Change in appetite - - - - -  Feeling bad or failure about yourself  - - - - -  Trouble concentrating - - - - -  Moving slowly or fidgety/restless - - - - -  Suicidal thoughts - - - - -  PHQ-9 Score - - - - -    Allergies  Allergen Reactions   Sulfa Antibiotics    Social History   Social  History Narrative   Widower 2018 (Damon). 2 sons.   College. Retired.    Former smoker (quit 04/2005- 30+ pack year), occasional etoh, no drugs   Drinks caffeine, uses herbal remedies, daily vitamin use   Wears her seatbelt, wears her bicycle helmet   Exercises routinely   Smoke detector in the home.    Firearms in the home (locked)   Feels safe in her relationships.    Past Medical History:  Diagnosis Date   Allergic rhinitis    Astigmatism    right eye   Back pain    Dr. Karin Lieu ([facility])   Basal cell carcinoma    Combined form of age-related cataract, both eyes    Cystocele    Demyelinating disease ([place]) 2003   No definitive workup completed; prior records   Depression    Disorder of refraction    both eyes   Early stage nonexudative age-related macular degeneration of both eyes    Exophoria    Fibrocystic breast    GERD (gastroesophageal reflux disease)    History of echocardiogram 12/2012   Dr. Daiva Huge: EF55-60%, mild TR, trace pulmonic and MR, abnl left vent diastolic   Hypertension  Nonexudative age-related macular degeneration, bilateral, early dry stage    Piriformis syndrome 05/2013   Posterior vitreous detachment, both eyes    Postmenopausal    Pseudophakia of left eye    Psoriasis 2009   Sciatica of left side 05/2013   TIA (transient ischemic attack) 2000   Urinary incontinence    Past Surgical History:  Procedure Laterality Date   CHOLECYSTECTOMY  11/2001   LUMBAR DISC SURGERY  04/2005   R L4-5  and L5-S1   SKIN BIOPSY Left 1999   left hand bcc vs scc   TONSILLECTOMY AND ADENOIDECTOMY     Family History  Problem Relation Age of Onset   Rheum arthritis Mother    Alcohol abuse Mother    Heart disease Father 63       MI at 25; died   Diabetes Father    Dementia Maternal Grandmother    Allergies as of 09/24/2021       Reactions   Sulfa Antibiotics         Medication List        Accurate as of September 24, 2021 11:05 AM. If  you have any questions, ask your nurse or doctor.          aspirin EC 325 MG tablet Take 325 mg by mouth daily.   baclofen 10 MG tablet Commonly known as: LIORESAL Take 1 tablet (10 mg total) by mouth 3 (three) times daily.   gabapentin 100 MG capsule Commonly known as: NEURONTIN Take 1 capsule (100 mg total) by mouth 3 (three) times daily. Started by: Howard Pouch, DO   hydrochlorothiazide 50 MG tablet Commonly known as: HYDRODIURIL TAKE 1 TABLET(50 MG) BY MOUTH DAILY   HYDROcodone-acetaminophen 5-325 MG tablet Commonly known as: NORCO/VICODIN Take 1 tablet by mouth every 6 (six) hours as needed for moderate pain. Started by: Howard Pouch, DO   losartan 100 MG tablet Commonly known as: COZAAR Take 1 tablet (100 mg total) by mouth daily.   meloxicam 15 MG tablet Commonly known as: MOBIC Take 1 tablet (15 mg total) by mouth daily.   MULTIVITAMIN ADULT PO Take 1 Dose by mouth.   NON FORMULARY   Omega 3 1000 MG Caps Take by mouth.   predniSONE 20 MG tablet Commonly known as: DELTASONE Take 3 tabs PO daily x 3 days, then 2 tabs PO daily x 3 days, then 1 tab PO daily x 3 days   tiZANidine 4 MG tablet Commonly known as: Zanaflex Take 0.5-1 tablets (2-4 mg total) by mouth every 8 (eight) hours as needed for muscle spasms. Started by: Howard Pouch, DO   tolterodine 4 MG 24 hr capsule Commonly known as: DETROL LA Take 4 mg by mouth daily.        All past medical history, surgical history, allergies, family history, immunizations andmedications were updated in the EMR today and reviewed under the history and medication portions of their EMR.     ROS Negative, with the exception of above mentioned in HPI   Objective:  BP (!) 141/93    Pulse 68    Temp 98.3 F (36.8 C) (Oral)    Ht '5\' 4"'$  (1.626 m)    Wt 152 lb (68.9 kg)    SpO2 97%    BMI 26.09 kg/m  Body mass index is 26.09 kg/m. Physical Exam Vitals and nursing note reviewed.  Constitutional:       General: She is not in acute distress.    Appearance: Normal appearance.  She is normal weight. She is not ill-appearing or toxic-appearing.  Eyes:     Extraocular Movements: Extraocular movements intact.     Conjunctiva/sclera: Conjunctivae normal.     Pupils: Pupils are equal, round, and reactive to light.  Musculoskeletal:     Right lower leg: No edema.     Left lower leg: No edema.     Comments: Lumbar spine: No erythema, no bony tenderness or step-off.  Paraspinal muscles without tenderness.  SI joints without tenderness to touch.  Muscle strength very mildly decreased right hip flexion and dorsiflexion of foot.  Right DTRs present, but diminished in comparison to left DTRs.  Decreased sensation right foot.  Unable to perform straight leg raises and specialized test ,secondary to discomfort  Neurological:     Mental Status: She is alert and oriented to person, place, and time. Mental status is at baseline.  Psychiatric:        Mood and Affect: Mood normal.        Behavior: Behavior normal.        Thought Content: Thought content normal.        Judgment: Judgment normal.    No results found. No results found. No results found for this or any previous visit (from the past 24 hour(s)).  Assessment/Plan: TIANA SIVERTSON is a 71 y.o. female present for OV for  Rest. Heat.  - toradol 30 IM provided today - failed conservative/supportive therapy > 8 weeks.  Dc baclofen> start zanaflex - start gabapentin 100 TID - start norco TID PRN - she has [facility] appt Monday.  - MRI w/ & w/o ordered today in hopes they will have results at [facility].   Reviewed expectations re: course of current medical issues. Discussed self-management of symptoms. Outlined signs and symptoms indicating need for more acute intervention. Patient verbalized understanding and all questions were answered. Patient received an After-Visit Summary.    Orders Placed This Encounter  Procedures   MR Lumbar Spine W Wo Contrast    Basic Metabolic Panel (BMET)   Meds ordered this encounter  Medications   gabapentin (NEURONTIN) 100 MG capsule    Sig: Take 1 capsule (100 mg total) by mouth 3 (three) times daily.    Dispense:  90 capsule    Refill:  1   tiZANidine (ZANAFLEX) 4 MG tablet    Sig: Take 0.5-1 tablets (2-4 mg total) by mouth every 8 (eight) hours as needed for muscle spasms.    Dispense:  90 tablet    Refill:  0   HYDROcodone-acetaminophen (NORCO/VICODIN) 5-325 MG tablet    Sig: Take 1 tablet by mouth every 6 (six) hours as needed for moderate pain.    Dispense:  28 tablet    Refill:  0   Referral Orders  No referral(s) requested today     Note is dictated utilizing voice recognition software. Although note has been proof read prior to signing, occasional typographical errors still can be missed. If any questions arise, please do not hesitate to call for verification.   electronically signed by:  Howard Pouch, DO  [place]

## 2021-09-24 NOTE — Addendum Note (Signed)
Addended by: Octaviano Glow on: 09/24/2021 02:31 PM ? ? Modules accepted: Orders ? ?

## 2021-09-25 ENCOUNTER — Ambulatory Visit (INDEPENDENT_AMBULATORY_CARE_PROVIDER_SITE_OTHER): Payer: Medicare Other

## 2021-09-25 DIAGNOSIS — Z1211 Encounter for screening for malignant neoplasm of colon: Secondary | ICD-10-CM | POA: Diagnosis not present

## 2021-09-25 DIAGNOSIS — Z0000 Encounter for general adult medical examination without abnormal findings: Secondary | ICD-10-CM | POA: Diagnosis not present

## 2021-09-25 LAB — BASIC METABOLIC PANEL
BUN: 23 mg/dL (ref 7–25)
CO2: 29 mmol/L (ref 20–32)
Calcium: 9.9 mg/dL (ref 8.6–10.4)
Chloride: 99 mmol/L (ref 98–110)
Creat: 0.65 mg/dL (ref 0.60–1.00)
Glucose, Bld: 103 mg/dL — ABNORMAL HIGH (ref 65–99)
Potassium: 3.9 mmol/L (ref 3.5–5.3)
Sodium: 136 mmol/L (ref 135–146)

## 2021-09-26 ENCOUNTER — Telehealth: Payer: Self-pay | Admitting: Family Medicine

## 2021-09-26 NOTE — Telephone Encounter (Signed)
MRI still under review, please advise.  ?

## 2021-09-26 NOTE — Telephone Encounter (Signed)
Pt stated that none of the medication she was prescribed on her last visit is working. She is in a great deal of pain. She stated she is existing from pill to pill and wants to know what else can be done. ?Please advise. ?

## 2021-09-26 NOTE — Telephone Encounter (Signed)
Spoke with pt regarding medication and provider's instructions.   ?

## 2021-09-26 NOTE — Telephone Encounter (Signed)
?  She has been prescribed narcotic, muscle relaxer and gabapentin by this provider.  ?She was prescribed steroid in the ED.  ?MRI order is being reviewed by her insurance still.  ? ?Unfortunately, the only recommendation I can offer is to increase the gabapentin to 3 tabs,  every 8 hours and rest. ?If pains worsens before she is able to see her Neuro team  Monday, then she will need to go to ED for pain control.  ? ? ?

## 2021-10-01 ENCOUNTER — Ambulatory Visit (INDEPENDENT_AMBULATORY_CARE_PROVIDER_SITE_OTHER): Payer: Medicare Other

## 2021-10-01 ENCOUNTER — Other Ambulatory Visit: Payer: Self-pay

## 2021-10-01 ENCOUNTER — Other Ambulatory Visit: Payer: Self-pay | Admitting: Family Medicine

## 2021-10-01 ENCOUNTER — Telehealth: Payer: Self-pay | Admitting: Family Medicine

## 2021-10-01 DIAGNOSIS — M5116 Intervertebral disc disorders with radiculopathy, lumbar region: Secondary | ICD-10-CM | POA: Diagnosis not present

## 2021-10-01 DIAGNOSIS — M545 Low back pain, unspecified: Secondary | ICD-10-CM

## 2021-10-01 DIAGNOSIS — M5416 Radiculopathy, lumbar region: Secondary | ICD-10-CM

## 2021-10-01 DIAGNOSIS — R2990 Unspecified symptoms and signs involving the nervous system: Secondary | ICD-10-CM | POA: Diagnosis not present

## 2021-10-01 MED ORDER — GABAPENTIN 100 MG PO CAPS: 100.0000 mg | ORAL_CAPSULE | Freq: Three times a day (TID) | ORAL | 0 refills | Status: DC

## 2021-10-01 MED ORDER — GADOBUTROL 1 MMOL/ML IV SOLN
7.0000 mL | Freq: Once | INTRAVENOUS | Status: AC | PRN
  Administered 2021-10-01: 7 mL via INTRAVENOUS

## 2021-10-01 MED ORDER — HYDROCHLOROTHIAZIDE 50 MG PO TABS: ORAL_TABLET | ORAL | 0 refills | Status: DC

## 2021-10-01 MED ORDER — TIZANIDINE HCL 4 MG PO TABS: 2.0000 mg | ORAL_TABLET | Freq: Three times a day (TID) | ORAL | 0 refills | Status: DC | PRN

## 2021-10-01 NOTE — Telephone Encounter (Signed)
Pt states has 3 days left on meds but see Neuro on Monday. Pt was advise to sched OV for [facility]. Please advise on pending medication.  ?

## 2021-10-01 NOTE — Telephone Encounter (Signed)
Pt wanting med refill on medication below to help with her back. Pt goes to see Neuro surgeon next Monday. ? ?Pharmacy won't refill it anymore, they told pt to call PCP office. ? ?hydrochlorothiazide ?hydrochlorothiazide (HYDRODIURIL) 50 MG tablet ? ? ?gabapentin ?gabapentin (NEURONTIN) 100 MG capsule ? ?tiZANidine ?tiZANidine (ZANAFLEX) 4 MG tablet ? ?HYDROcodone-acetaminophen ?HYDROcodone-acetaminophen (NORCO/VICODIN) 5-325 MG tablet ? ? ?[id number] - [place], [place] - [address] AT [address] Phone:  [phone]  ?Fax:  [phone]  ?  ? ? ? ? ? ?

## 2021-10-01 NOTE — Telephone Encounter (Signed)
I refilled her hydrochlorothiazide, gabapentin and the Zanaflex.  She will need an office visit in order for [place] to fill the opioid pain medicine.   ?Amount of opioids that can be prescribed for first visit for pain is limited and I gave her the maximum amount of days per law. ?I will need to see her back in the office in order to provide her with additional pain coverage with opioid. ?We cannot get her in quickly tomorrow if she desires.   ?

## 2021-10-02 ENCOUNTER — Encounter: Payer: Self-pay | Admitting: Family Medicine

## 2021-10-02 ENCOUNTER — Ambulatory Visit (INDEPENDENT_AMBULATORY_CARE_PROVIDER_SITE_OTHER): Payer: Medicare Other | Admitting: Family Medicine

## 2021-10-02 VITALS — BP 134/80 | HR 83 | Temp 97.8°F | Ht 64.0 in | Wt 152.0 lb

## 2021-10-02 DIAGNOSIS — M5416 Radiculopathy, lumbar region: Secondary | ICD-10-CM | POA: Diagnosis not present

## 2021-10-02 MED ORDER — HYDROCODONE-ACETAMINOPHEN 10-325 MG PO TABS: 1.0000 | ORAL_TABLET | ORAL | 0 refills | Status: DC | PRN

## 2021-10-02 MED ORDER — GABAPENTIN 300 MG PO CAPS: 300.0000 mg | ORAL_CAPSULE | Freq: Three times a day (TID) | ORAL | 3 refills | Status: DC

## 2021-10-02 NOTE — Patient Instructions (Signed)
In the day: ?Vicodin is higher dose now (double the other dose). Take every 4-6 hours.  ?Gabapentin dose now higher also (300 mg in each pill).  ?  - take 1 tab every 8 hours in the day and 600 (2 tabs) in the evening. If needed, you can increase to 2 tabs every 8 hours. Watch this does not make you too tired if needing to drive.  ? ? ?I hope this helps . ?We will call you once I get the formal report on your MRI.  ?

## 2021-10-02 NOTE — Progress Notes (Signed)
? ? ? ?This visit occurred during the SARS-CoV-2 public health emergency.  Safety protocols were in place, including screening questions prior to the visit, additional usage of staff PPE, and extensive cleaning of exam room while observing appropriate contact time as indicated for disinfecting solutions.  ? ? ?[name] , 06-30-1951, 71 y.o., female ?MRN: [id number] ?Patient Care Team  ?  Relationship Specialty Notifications Start End  ?[name], DO PCP - General Family Medicine  01/04/16   ?[name], DPM Consulting Physician Podiatry  01/12/16   ? ? ?Chief Complaint  ?Patient presents with  ? Back Pain  ? ?  ?Subjective: [name] is a 71 y.o. female present for back pain. She was seen last week after TWO ED visits for back pain. She has NS appt, but no until this coming Monday. She was started on gabapentin, muscle relaxer and opiods by this provider last week. She reports pain is worsening and she has tapered up on gabapentin. She feels the medications help, but pain is still moderate with meds and they Guallpa off before it is time for her next dose. She denies bladder or bowel dysfunction.  ? ?Prior note: ?Pt presents for an OV with complaints of low back pain that radiates down to her right foot.  She states her right foot is numb.  However it is pain that radiates down her leg.  She has lower back pain as well and has difficulty straightening her back.  She is walking bent over.  She has a history of lumbar discectomy at L4-L5 and L5-S1 in 2006.  Her neurosurgeon no longer is practicing.  She has been seen twice in the urgent care over the last week and treated with baclofen muscle relaxer and prednisone.  She states this pain has been ongoing for over 2 months.  She did receive minimal relief after steroid injection in the urgent care.  But since then nothing seems to have worked well for her. ? ?Depression screen [facility] 2/9 09/24/2021 04/09/2021 04/12/2020 02/25/2020 02/10/2019  ?Decreased  Interest 0 0 0 0 0  ?Down, Depressed, Hopeless 0 0 0 0 0  ?PHQ - 2 Score 0 0 0 0 0  ?Altered sleeping - - - - -  ?Tired, decreased energy - - - - -  ?Change in appetite - - - - -  ?Feeling bad or failure about yourself  - - - - -  ?Trouble concentrating - - - - -  ?Moving slowly or fidgety/restless - - - - -  ?Suicidal thoughts - - - - -  ?PHQ-9 Score - - - - -  ? ? ?Allergies  ?Allergen Reactions  ? Sulfa Antibiotics   ? ?Social History  ? ?Social History Narrative  ? Widower 2018 ([place]). 2 sons.  ? College. Retired.   ? Former smoker (quit 04/2005- 30+ pack year), occasional etoh, no drugs  ? Drinks caffeine, uses herbal remedies, daily vitamin use  ? Wears her seatbelt, wears her bicycle helmet  ? Exercises routinely  ? Smoke detector in the home.   ? Firearms in the home (locked)  ? Feels safe in her relationships.   ? ?Past Medical History:  ?Diagnosis Date  ? Allergic rhinitis   ? Astigmatism   ? right eye  ? Back pain   ? Dr. Karin Lieu ([facility])  ? Basal cell carcinoma   ? Combined form of age-related cataract, both eyes   ? Cystocele   ? Demyelinating  disease [facility]) 2003  ? No definitive workup completed; prior records  ? Depression   ? Disorder of refraction   ? both eyes  ? Early stage nonexudative age-related macular degeneration of both eyes   ? Exophoria   ? Fibrocystic breast   ? GERD (gastroesophageal reflux disease)   ? History of echocardiogram 12/2012  ? Dr. Daiva Huge: EF55-60%, mild TR, trace pulmonic and MR, abnl left vent diastolic  ? Hypertension   ? Nonexudative age-related macular degeneration, bilateral, early dry stage   ? Piriformis syndrome 05/2013  ? Posterior vitreous detachment, both eyes   ? Postmenopausal   ? Pseudophakia of left eye   ? Psoriasis 2009  ? Sciatica of left side 05/2013  ? TIA (transient ischemic attack) 2000  ? Urinary incontinence   ? ?Past Surgical History:  ?Procedure Laterality Date  ? CHOLECYSTECTOMY  11/2001  ? [place] SURGERY  04/2005  ? R L4-5  and  L5-S1  ? SKIN BIOPSY Left 1999  ? left hand bcc vs scc  ? TONSILLECTOMY AND ADENOIDECTOMY    ? ?Family History  ?Problem Relation Age of Onset  ? Rheum arthritis Mother   ? Alcohol abuse Mother   ? Heart disease Father 19  ?     MI at 1; died  ? Diabetes Father   ? Dementia Maternal Grandmother   ? ?Allergies as of 10/02/2021   ? ?   Reactions  ? Sulfa Antibiotics   ? ?  ? ?  ?Medication List  ?  ? ?  ? Accurate as of October 02, 2021  9:51 AM. If you have any questions, ask your nurse or doctor.  ?  ?  ? ?  ? ?aspirin EC 325 MG tablet ?Take 325 mg by mouth daily. ?  ?gabapentin 100 MG capsule ?Commonly known as: NEURONTIN ?Take 1-2 capsules (100-200 mg total) by mouth 3 (three) times daily. ?  ?hydrochlorothiazide 50 MG tablet ?Commonly known as: HYDRODIURIL ?TAKE 1 TABLET(50 MG) BY MOUTH DAILY ?  ?HYDROcodone-acetaminophen 5-325 MG tablet ?Commonly known as: NORCO/VICODIN ?Take 1 tablet by mouth every 6 (six) hours as needed for moderate pain. ?  ?losartan 100 MG tablet ?Commonly known as: COZAAR ?Take 1 tablet (100 mg total) by mouth daily. ?  ?meloxicam 15 MG tablet ?Commonly known as: MOBIC ?Take 1 tablet (15 mg total) by mouth daily. ?  ?MULTIVITAMIN ADULT PO ?Take 1 Dose by mouth. ?  ?NON FORMULARY ?  ?Omega 3 1000 MG Caps ?Take by mouth. ?  ?predniSONE 20 MG tablet ?Commonly known as: DELTASONE ?Take 3 tabs PO daily x 3 days, then 2 tabs PO daily x 3 days, then 1 tab PO daily x 3 days ?  ?tiZANidine 4 MG tablet ?Commonly known as: Zanaflex ?Take 0.5-1 tablets (2-4 mg total) by mouth every 8 (eight) hours as needed for muscle spasms. ?  ?tolterodine 4 MG 24 hr capsule ?Commonly known as: DETROL LA ?Take 4 mg by mouth daily. ?  ? ?  ? ? ?All past medical history, surgical history, allergies, family history, immunizations andmedications were updated in the EMR today and reviewed under the history and medication portions of their EMR.    ? ?ROS ?Negative, with the exception of above mentioned in  HPI ? ? ?Objective:  ?BP 134/80   Pulse 83   Temp 97.8 ?F (36.6 ?C) (Oral)   Ht '5\' 4"'$  (1.626 m)   Wt 152 lb (68.9 kg)   SpO2 97%  BMI 26.09 kg/m?  ?Body mass index is 26.09 kg/m?Marland Kitchen ?Physical Exam ?Vitals and nursing note reviewed.  ?Constitutional:   ?   General: She is not in acute distress. ?   Appearance: Normal appearance. She is normal weight. She is not ill-appearing or toxic-appearing.  ?Eyes:  ?   Extraocular Movements: Extraocular movements intact.  ?   Conjunctiva/sclera: Conjunctivae normal.  ?   Pupils: Pupils are equal, round, and reactive to light.  ?Musculoskeletal:  ?   Right lower leg: No edema.  ?   Left lower leg: No edema.  ?   Comments: Lumbar spine: No erythema, no bony tenderness or step-off.  Paraspinal muscles without tenderness.  SI joints without tenderness to touch.  Muscle strength very mildly decreased right hip flexion and dorsiflexion of foot.  Right DTRs present, but diminished in comparison to left DTRs.  Decreased sensation right foot.  Unable to perform straight leg raises and specialized test ,secondary to discomfort  ?Neurological:  ?   Mental Status: She is alert and oriented to person, place, and time. Mental status is at baseline.  ?Psychiatric:     ?   Mood and Affect: Mood normal.     ?   Behavior: Behavior normal.     ?   Thought Content: Thought content normal.     ?   Judgment: Judgment normal.  ? ? ?No results found. ?No results found. ?No results found for this or any previous visit (from the past 24 hour(s)). ? ?Assessment/Plan: ?[name] is a 71 y.o. female present for OV for  ?Lumbar radicular pain ?Patient is in rather significant pain. She is well known to this provider. She is unable to stand up straight with walking.  ?Fortunately, we were able to get her the MRI completed before her NS appt Monday- awaiting formal read. Discussed my informal read of MRI ?Rest. Heat.  ?- failed conservative/supportive therapy > 8 weeks.  ?Continue zanaflex ?- increase  gabapentin 300-600 TID ?- increase norco to 10-325> q4-6 ?- she has NS appt Monday.  ? ?Reviewed expectations re: course of current medical issues. ?Discussed self-management of symptoms. ?Outlined signs and symptoms indicating

## 2021-10-02 NOTE — Telephone Encounter (Signed)
Spoke with pt regarding medication and instructions.  

## 2021-10-03 ENCOUNTER — Telehealth: Payer: Self-pay | Admitting: Family Medicine

## 2021-10-03 NOTE — Telephone Encounter (Signed)
Patient returned call regarding results. Patient can be reached on her cell [phone] ?

## 2021-10-03 NOTE — Telephone Encounter (Signed)
Please call patient and please fax MRI results to the neurosurgeon she is seeing on Monday.  I do not have the neurosurgeon name, so she will need to be asked. ? ?Her MRI results have returned and are as following: ?The last 2 lumbar disc spaces of her lower back have a diffuse disc bulging causing which is pressing on the right and left nerves exiting to the legs. ?mild spinal canal stenosis and impingement on the nerves exiting to the legs on both the left and right side. ?

## 2021-10-03 NOTE — Telephone Encounter (Signed)
Spoke with pt regarding labs and instructions. Results faxed.  ?

## 2021-10-08 DIAGNOSIS — M5441 Lumbago with sciatica, right side: Secondary | ICD-10-CM | POA: Diagnosis not present

## 2021-10-08 DIAGNOSIS — I7123 Aneurysm of the descending thoracic aorta, without rupture: Secondary | ICD-10-CM | POA: Diagnosis not present

## 2021-10-08 DIAGNOSIS — Z6826 Body mass index (BMI) 26.0-26.9, adult: Secondary | ICD-10-CM | POA: Diagnosis not present

## 2021-10-09 ENCOUNTER — Telehealth: Payer: Self-pay

## 2021-10-09 ENCOUNTER — Encounter: Payer: Self-pay | Admitting: Family Medicine

## 2021-10-09 NOTE — Telephone Encounter (Signed)
Spoke with pt and informed her that provider is out of the office and to contact neurologist regarding back pain. Pt stated that she seen them yesterday and the scheduled her for a injection in April. Pt stated that she is in severe pain and cannot live like this. I apologized to patient for the inconvenience and explained it is important for neuro to understand patient began to cry and then disconnect call.  ?

## 2021-10-09 NOTE — Telephone Encounter (Signed)
Please call patient regarding back pain.  She states Dr. Raoul Pitch is aware of issue.  She rec'd back injection and she is still in pain. ? ?Please call [phone] ?

## 2021-10-10 ENCOUNTER — Telehealth: Payer: Self-pay | Admitting: Family Medicine

## 2021-10-10 NOTE — Telephone Encounter (Signed)
Med refill for medications below ? ?HYDROcodone-acetaminophen ?HYDROcodone-acetaminophen (NORCO) 10-325 MG tablet ?(This runs out at the end of this month.) ? ?tiZANidine ?tiZANidine (ZANAFLEX) 4 MG tablet ? ?Pt mentioned injection in her spine on April 20 th ? ?Pt said in order to deal with her pain she needs medication. ?She said she spoke with office rep  ?[place] office num: [phone] ext: [phone] ?She said our office had to call her about medication.  ? ? ? ? ? ?

## 2021-10-10 NOTE — Telephone Encounter (Signed)
Please continue same encounter.  ?

## 2021-10-10 NOTE — Telephone Encounter (Signed)
Pt states she doesn't mind for either office to continue pain meds. Pt states that she is willing to come in office again if needed. Please clarify if appt is needed.  ?

## 2021-10-10 NOTE — Telephone Encounter (Signed)
Pt sched in next avail ?

## 2021-10-10 NOTE — Telephone Encounter (Addendum)
I am so sorry she is still in severe pain.  ?Please inform her only her neuro can schedule, or bump up patients, on their schedule. I do recommend she call them and let them know she is in severe pain and can not walk.  ?If they did not prescribe her pain meds, then we can help her with pain management. She will need an additional to cover pain management refills if she is in need.  ?

## 2021-10-10 NOTE — Telephone Encounter (Signed)
I am so sorry she is still in severe pain.  ?Please inform her only her neuro can schedule, or bump up patients, on their schedule. I do recommend she call them and let them know she is in severe pain and can not walk.  ?If they did not prescribe her pain meds, then we can help her with pain management. She will need an additional appt to cover pain management refills if she is in need.  ?

## 2021-10-10 NOTE — Telephone Encounter (Signed)
FYI see other encounter.  ?

## 2021-10-10 NOTE — Telephone Encounter (Signed)
Ragin, Kenae C 2 minutes ago (10:15 AM)  ? ? ?Med refill for medications below ?  ?HYDROcodone-acetaminophen ?HYDROcodone-acetaminophen (NORCO) 10-325 MG tablet ?(This runs out at the end of this month.) ?  ?tiZANidine ?tiZANidine (ZANAFLEX) 4 MG tablet ?  ?Pt mentioned injection in her spine on April 20 th ?  ?Pt said in order to deal with her pain she needs medication. ?She said she spoke with office rep  ?[place] office num: [phone] ext: [phone] ?She said our office had to call her about medication.   ?  ? ?

## 2021-10-12 ENCOUNTER — Encounter: Payer: Self-pay | Admitting: Family Medicine

## 2021-10-12 ENCOUNTER — Ambulatory Visit (INDEPENDENT_AMBULATORY_CARE_PROVIDER_SITE_OTHER): Payer: Medicare Other | Admitting: Family Medicine

## 2021-10-12 VITALS — BP 125/69 | HR 73 | Temp 98.0°F | Ht 64.0 in | Wt 152.0 lb

## 2021-10-12 DIAGNOSIS — M5416 Radiculopathy, lumbar region: Secondary | ICD-10-CM

## 2021-10-12 MED ORDER — OXYCODONE-ACETAMINOPHEN 5-325 MG PO TABS: 1.0000 | ORAL_TABLET | Freq: Four times a day (QID) | ORAL | 0 refills | Status: DC | PRN

## 2021-10-12 MED ORDER — DICLOFENAC SODIUM ER 100 MG PO TB24: 100.0000 mg | ORAL_TABLET | Freq: Every day | ORAL | 1 refills | Status: DC

## 2021-10-12 MED ORDER — PREGABALIN 150 MG PO CAPS: 150.0000 mg | ORAL_CAPSULE | Freq: Two times a day (BID) | ORAL | 2 refills | Status: DC

## 2021-10-12 MED ORDER — TIZANIDINE HCL 4 MG PO TABS: 4.0000 mg | ORAL_TABLET | Freq: Three times a day (TID) | ORAL | 5 refills | Status: DC | PRN

## 2021-10-12 NOTE — Progress Notes (Signed)
? ? ? ?This visit occurred during the SARS-CoV-2 public health emergency.  Safety protocols were in place, including screening questions prior to the visit, additional usage of staff PPE, and extensive cleaning of exam room while observing appropriate contact time as indicated for disinfecting solutions.  ? ? ?[name] , May 22, 1951, 71 y.o., female ?MRN: [id number] ?Patient Care Team  ?  Relationship Specialty Notifications Start End  ?[name], DO PCP - General Family Medicine  01/04/16   ?[name], DPM Consulting Physician Podiatry  01/12/16   ?Ashok Pall, MD Consulting Physician Neurosurgery  10/03/21   ? ? ?Chief Complaint  ?Patient presents with  ? Pain Management  ? ?  ?Subjective: [name] is a 71 y.o. female present for follow up on her back pain.  Patient presents today for follow-up on her lumbar pain.  She has been evaluated by neurosurgery and they are unable to get her in for another 3 weeks to have epidural injection.  Patient is in severe pain and is unable to walk or move without any discomfort.  She states she has been taking the Norco every 4 hours, the gabapentin and the tenacity and as prescribed.  She states she feels like she is living by the clock waiting for the next dose because it is wearing out too soon.  She denies bladder or bowel incontinence at this time. ? ?Prior note: ?She was seen last week after TWO ED visits for back pain. She has NS appt, but no until this coming Monday. She was started on gabapentin, muscle relaxer and opiods by this provider last week. She reports pain is worsening and she has tapered up on gabapentin. She feels the medications help, but pain is still moderate with meds and they Derderian off before it is time for her next dose. She denies bladder or bowel dysfunction.  ? ?Prior note: ?Pt presents for an OV with complaints of low back pain that radiates down to her right foot.  She states her right foot is numb.  However it is pain that  radiates down her leg.  She has lower back pain as well and has difficulty straightening her back.  She is walking bent over.  She has a history of lumbar discectomy at L4-L5 and L5-S1 in 2006.  Her neurosurgeon no longer is practicing.  She has been seen twice in the urgent care over the last week and treated with baclofen muscle relaxer and prednisone.  She states this pain has been ongoing for over 2 months.  She did receive minimal relief after steroid injection in the urgent care.  But since then nothing seems to have worked well for her. ? ? ?  09/24/2021  ? 10:12 AM 04/09/2021  ?  9:41 AM 04/12/2020  ?  8:22 AM 02/25/2020  ? 10:34 AM 02/10/2019  ?  8:06 AM  ?Depression screen PHQ 2/9  ?Decreased Interest 0 0 0 0 0  ?Down, Depressed, Hopeless 0 0 0 0 0  ?PHQ - 2 Score 0 0 0 0 0  ? ? ?Allergies  ?Allergen Reactions  ? Sulfa Antibiotics   ? ?Social History  ? ?Social History Narrative  ? Widower 2018 ([place]). 2 sons.  ? College. Retired.   ? Former smoker (quit 04/2005- 30+ pack year), occasional etoh, no drugs  ? Drinks caffeine, uses herbal remedies, daily vitamin use  ? Wears her seatbelt, wears her bicycle helmet  ? Exercises routinely  ? Smoke detector  in the home.   ? Firearms in the home (locked)  ? Feels safe in her relationships.   ? ?Past Medical History:  ?Diagnosis Date  ? Allergic rhinitis   ? Astigmatism   ? right eye  ? Back pain   ? Dr. Karin Lieu ([facility])  ? Basal cell carcinoma   ? Combined form of age-related cataract, both eyes   ? Cystocele   ? Demyelinating disease ([place]) 2003  ? No definitive workup completed; prior records  ? Depression   ? Disorder of refraction   ? both eyes  ? Early stage nonexudative age-related macular degeneration of both eyes   ? Exophoria   ? Fibrocystic breast   ? GERD (gastroesophageal reflux disease)   ? History of echocardiogram 12/2012  ? Dr. Daiva Huge: EF55-60%, mild TR, trace pulmonic and MR, abnl left vent diastolic  ? Hypertension   ? Nonexudative  age-related macular degeneration, bilateral, early dry stage   ? Piriformis syndrome 05/2013  ? Posterior vitreous detachment, both eyes   ? Postmenopausal   ? Pseudophakia of left eye   ? Psoriasis 2009  ? Sciatica of left side 05/2013  ? TIA (transient ischemic attack) 2000  ? Urinary incontinence   ? ?Past Surgical History:  ?Procedure Laterality Date  ? CHOLECYSTECTOMY  11/2001  ? [place] SURGERY  04/2005  ? R L4-5  and L5-S1  ? SKIN BIOPSY Left 1999  ? left hand bcc vs scc  ? TONSILLECTOMY AND ADENOIDECTOMY    ? ?Family History  ?Problem Relation Age of Onset  ? Rheum arthritis Mother   ? Alcohol abuse Mother   ? Heart disease Father 24  ?     MI at 36; died  ? Diabetes Father   ? Dementia Maternal Grandmother   ? ?Allergies as of 10/12/2021   ? ?   Reactions  ? Sulfa Antibiotics   ? ?  ? ?  ?Medication List  ?  ? ?  ? Accurate as of October 12, 2021 11:59 PM. If you have any questions, ask your nurse or doctor.  ?  ?  ? ?  ? ?STOP taking these medications   ? ?gabapentin 300 MG capsule ?Commonly known as: NEURONTIN ?Stopped by: Howard Pouch, DO ?  ?HYDROcodone-acetaminophen 10-325 MG tablet ?Commonly known as: NORCO ?Stopped by: Howard Pouch, DO ?  ?meloxicam 15 MG tablet ?Commonly known as: MOBIC ?Stopped by: Howard Pouch, DO ?  ? ?  ? ?TAKE these medications   ? ?aspirin EC 325 MG tablet ?Take 325 mg by mouth daily. ?  ?Diclofenac Sodium CR 100 MG 24 hr tablet ?Take 1 tablet (100 mg total) by mouth daily. ?Started by: Howard Pouch, DO ?  ?hydrochlorothiazide 50 MG tablet ?Commonly known as: HYDRODIURIL ?TAKE 1 TABLET(50 MG) BY MOUTH DAILY ?  ?losartan 100 MG tablet ?Commonly known as: COZAAR ?Take 1 tablet (100 mg total) by mouth daily. ?  ?MULTIVITAMIN ADULT PO ?Take 1 Dose by mouth. ?  ?NON FORMULARY ?  ?Omega 3 1000 MG Caps ?Take by mouth. ?  ?oxyCODONE-acetaminophen 5-325 MG tablet ?Commonly known as: PERCOCET/ROXICET ?Take 1 tablet by mouth every 6 (six) hours as needed for severe pain. ?Started by:  Howard Pouch, DO ?  ?pregabalin 150 MG capsule ?Commonly known as: Lyrica ?Take 1 capsule (150 mg total) by mouth 2 (two) times daily. ?Started by: Howard Pouch, DO ?  ?tiZANidine 4 MG tablet ?Commonly known as: Zanaflex ?Take 1 tablet (4 mg total) by  mouth every 8 (eight) hours as needed for muscle spasms. ?What changed: how much to take ?Changed by: Howard Pouch, DO ?  ?tolterodine 4 MG 24 hr capsule ?Commonly known as: DETROL LA ?Take 4 mg by mouth daily. ?  ? ?  ? ? ?All past medical history, surgical history, allergies, family history, immunizations andmedications were updated in the EMR today and reviewed under the history and medication portions of their EMR.    ? ?ROS ?Negative, with the exception of above mentioned in HPI ? ? ?Objective:  ?BP 125/69   Pulse 73   Temp 98 ?F (36.7 ?C) (Oral)   Ht '5\' 4"'$  (1.626 m)   Wt 152 lb (68.9 kg)   SpO2 97%   BMI 26.09 kg/m?  ?Body mass index is 26.09 kg/m?Marland Kitchen ?Physical Exam ?Vitals and nursing note reviewed.  ?Constitutional:   ?   General: She is not in acute distress. ?   Appearance: Normal appearance. She is normal weight. She is not ill-appearing or toxic-appearing.  ?Eyes:  ?   Extraocular Movements: Extraocular movements intact.  ?   Conjunctiva/sclera: Conjunctivae normal.  ?   Pupils: Pupils are equal, round, and reactive to light.  ?Musculoskeletal:  ?   Right lower leg: No edema.  ?   Left lower leg: No edema.  ?   Comments: Lumbar spine: No erythema, no bony tenderness or step-off.  Paraspinal muscles without tenderness.  SI joints without tenderness to touch.  Muscle strength very mildly decreased right hip flexion and dorsiflexion of foot.  Right DTRs present, but diminished in comparison to left DTRs.  Decreased sensation right foot.  Unable to perform straight leg raises and specialized test ,secondary to discomfort  ?Neurological:  ?   Mental Status: She is alert and oriented to person, place, and time. Mental status is at baseline.  ?Psychiatric:      ?   Mood and Affect: Mood normal.     ?   Behavior: Behavior normal.     ?   Thought Content: Thought content normal.     ?   Judgment: Judgment normal.  ? ? ?No results found. ?No results found. ?No results found

## 2021-10-12 NOTE — Patient Instructions (Addendum)
Continue the TIZ as is... called in refills.  ?2. Stop hydrocodone and start the oxycodone (6hr) ?3. Stop the gab and start the Lyrica (1 tabby mouth every day       for          3 days, then 1 tab twice a day after) ?4. Stop meloxicam and start diclofenac once daily ? ? ? ?Epidural Steroid Injection ?An epidural steroid injection is a shot of steroid medicine and numbing medicine that is given into the space between the spinal cord and the bones of the back (epidural space). The shot helps relieve pain caused by an irritated or swollen nerve root. ?The amount of pain relief you get from the injection depends on what is causing the nerve to be swollen and irritated, and how long your pain lasts. You are more likely to benefit from this injection if your pain is strong and comes on suddenly rather than if you have had long-term (chronic) pain. ?Tell a health care provider about: ?Any allergies you have. ?All medicines you are taking, including vitamins, herbs, eye drops, creams, and over-the-counter medicines. ?Any problems you or family members have had with anesthetic medicines. ?Any blood disorders you have. ?Any surgeries you have had. ?Any medical conditions you have. ?Whether you are pregnant or may be pregnant. ?What are the risks? ?Generally, this is a safe procedure. However, problems may occur, including: ?Headache. ?Bleeding. ?Infection. ?Allergic reaction to medicines. ?Nerve damage. ?What happens before the procedure? ?Staying hydrated ?Follow instructions from your health care provider about hydration, which may include: ?Up to 2 hours before the procedure - you may continue to drink clear liquids, such as water, clear fruit juice, black coffee, and plain tea. ?Eating and drinking restrictions ?Follow instructions from your health care provider about eating and drinking, which may include: ?8 hours before the procedure - stop eating heavy meals or foods, such as meat, fried foods, or fatty foods. ?6  hours before the procedure - stop eating light meals or foods, such as toast or cereal. ?6 hours before the procedure - stop drinking milk or drinks that contain milk. ?2 hours before the procedure - stop drinking clear liquids. ?Medicines ?You may be given medicines to lower anxiety. ?Ask your health care provider about: ?Changing or stopping your regular medicines. This is especially important if you are taking diabetes medicines or blood thinners. ?Taking medicines such as aspirin and ibuprofen. These medicines can thin your blood. Do not take these medicines unless your health care provider tells you to take them. ?Taking over-the-counter medicines, vitamins, herbs, and supplements. ?General instructions ?Ask your health care provider what steps will be taken to prevent infection. ?Plan to have a responsible adult take you home from the hospital or clinic. ?If you will be going home right after the procedure, plan to have a responsible adult care for you for the time you are told. This is important. ?What happens during the procedure? ?An IV will be inserted into one of your veins. ?You will be given one or more of the following: ?A medicine to help you relax (sedative). ?A medicine to numb the area (local anesthetic). ?You will be asked to lie on your abdomen or sit. ?The injection site will be cleaned. ?A needle will be inserted through your skin into the epidural space. This may cause you some discomfort. An X-ray machine will be used to guide the needle as close as possible to the affected nerve. ?A steroid medicine and a local  anesthetic will be injected into the epidural space. ?The needle and IV will be removed. ?A bandage (dressing) will be put over the injection site. ?The procedure may vary among health care providers and hospitals. ?What can I expect after the procedure? ?Your blood pressure, heart rate, breathing rate, and blood oxygen level will be monitored until you leave the hospital or  clinic. ?Your arm or leg may feel weak or numb for a few hours. ?The injection site may feel sore. ?Follow these instructions at home: ?Injection site care ?You may remove the bandage (dressing) after 24 hours. ?Check your injection site every day for signs of infection. Check for: ?Redness, swelling, or pain. ?Fluid or blood. ?Warmth. ?Pus or a bad smell. ?Managing pain, stiffness, and swelling ?For 24 hours after the procedure: ?Avoid using heat on the injection site. ?Do not take baths, swim, or use a hot tub until your health care provider approves. Ask your health care provider if you may take showers. You may only be allowed to take sponge baths. ?If directed, put ice on the injection site. To do this: ?Put ice in a plastic bag. ?Place a towel between your skin and the bag. ?Leave the ice on for 20 minutes, 2-3 times a day. ? ?Activity ?If you were given a sedative during the procedure, it can affect you for several hours. Do not drive or operate machinery until your health care provider says that it is safe. ?Return to your normal activities as told by your health care provider. Ask your health care provider what activities are safe for you. ?General instructions ?Take over-the-counter and prescription medicines only as told by your health care provider. ?Drink enough fluid to keep your urine pale yellow. ?Keep all follow-up visits as told by your health care provider. This is important. ?Contact a health care provider if: ?You have any of these signs of infection: ?Redness, swelling, or pain around your injection site. ?Fluid or blood coming from your injection site. ?Warmth coming from your injection site. ?Pus or a bad smell coming from your injection site. ?A fever. ?You continue to have pain and soreness around the injection site, even after taking over-the-counter pain medicine. ?You have severe, sudden, or lasting nausea or vomiting. ?Get help right away if: ?You have severe pain at the injection site  that is not relieved by medicines. ?You develop a severe headache or a stiff neck. ?You become sensitive to light. ?You have any new numbness or weakness in your legs or arms. ?You lose control of your bladder or bowel movements. ?You have trouble breathing. ?Summary ?An epidural steroid injection is a shot of steroid medicine and numbing medicine that is given into the epidural space. ?The shot helps relieve pain caused by an irritated or swollen nerve root. ?You are more likely to benefit from this injection if your pain is strong and comes on suddenly rather than if you have had chronic pain. ?This information is not intended to replace advice given to you by your health care provider. Make sure you discuss any questions you have with your health care provider. ?Document Revised: 10/29/2019 Document Reviewed: 01/11/2019 ?Elsevier Patient Education ? [place]. ? ?

## 2021-10-13 LAB — DRUG MONITORING PANEL 376104, URINE

## 2021-10-13 LAB — TIQ-MISC

## 2021-10-13 LAB — DM TEMPLATE

## 2021-10-15 ENCOUNTER — Telehealth: Payer: Self-pay | Admitting: Family Medicine

## 2021-10-15 ENCOUNTER — Institutional Professional Consult (permissible substitution) (INDEPENDENT_AMBULATORY_CARE_PROVIDER_SITE_OTHER): Payer: Medicare Other | Admitting: Physician Assistant

## 2021-10-15 VITALS — BP 134/81 | HR 85 | Resp 20 | Ht 64.0 in | Wt 152.0 lb

## 2021-10-15 DIAGNOSIS — I712 Thoracic aortic aneurysm, without rupture, unspecified: Secondary | ICD-10-CM | POA: Diagnosis not present

## 2021-10-15 DIAGNOSIS — M5416 Radiculopathy, lumbar region: Secondary | ICD-10-CM | POA: Diagnosis not present

## 2021-10-15 NOTE — Addendum Note (Signed)
Addended by: Octaviano Glow on: 10/15/2021 03:49 PM ? ? Modules accepted: Orders ? ?

## 2021-10-15 NOTE — Telephone Encounter (Signed)
Possible 2 causes of losing control of her bladder. ?1.  She is on more sedate of medications, which could have caused her not to awaken enough to respond to the urge to urinate. ?2.  If this is loss of control of her bladder secondary to her spinal stenosis, she will continue to have loss of her bladder throughout the day.  If that occurs, I urged her to call her neurosurgeon immediately to report symptoms as those are more emergent symptoms. ?

## 2021-10-15 NOTE — Telephone Encounter (Signed)
[name], can you please call patient and ask her to come in and have a urine collection again.  Lab states her urine sample, "leak during transport.  " ?

## 2021-10-15 NOTE — Patient Instructions (Signed)
Make every effort to maintain a "heart-healthy" lifestyle with regular physical exercise and adherence to a low-fat, low-carbohydrate diet.  Continue to seek regular follow-up appointments with your primary care physician and/or cardiologist. ? ?Stop smoking immediately and permanently. ? ?Make every effort to maintain a "heart-healthy" lifestyle with regular physical exercise and adherence to a low-fat, low-carbohydrate diet.  Continue to seek regular follow-up appointments with your primary care physician and/or cardiologist. ? ?Please avoid Flouroquinolones as these increase risk of aortic dissection ? ? ?

## 2021-10-15 NOTE — Telephone Encounter (Signed)
Spoke with patient she states that she woke up this morning and the bed was wet. States she was told that if she ever lost control of her bladder, she was to let PCP know. ?

## 2021-10-15 NOTE — Progress Notes (Signed)
? ?   ?[address] ?      [place] [address] ?            [phone]   ? ?  ? ? ?[name] ?[id number] ?[birth year] ? ? ?History of Present Illness: ? ?[name] is a 71 yo female with history of HTN, TIA, HLD,  Former smoker, and chronic back pain.  She recently underwent a low dose lung cancer screening CT.  She was incidentally found to have a 4.2 cm ascending aortic aneurysm, for which she has been referred for surveillance.  Currently, the patient is having a lot of issues with back pain.  She had back surgery back in 2006, but she is having issues currently and is waiting to get an epidural.  She was very active prior to this and wishes to get back to being able to travel and do things she enjoys.  She quit smoking back in 2006, but has since resumed after she lost her husband.  She is currently smoking 1 ppd.  She denies chest pain, palpitations, and shortness of breath.  She is not currently on a statin and doesn't wish to take one or hear about one.  She is compliant with her blood pressure medications. ? ?Current Outpatient Medications on File Prior to Visit  ?Medication Sig Dispense Refill  ? aspirin EC 325 MG tablet Take 325 mg by mouth daily.    ? Diclofenac Sodium CR 100 MG 24 hr tablet Take 1 tablet (100 mg total) by mouth daily. 90 tablet 1  ? hydrochlorothiazide (HYDRODIURIL) 50 MG tablet TAKE 1 TABLET(50 MG) BY MOUTH DAILY 90 tablet 0  ? losartan (COZAAR) 100 MG tablet Take 1 tablet (100 mg total) by mouth daily. 90 tablet 1  ? Multiple Vitamins-Minerals (MULTIVITAMIN ADULT PO) Take 1 Dose by mouth.    ? NON FORMULARY     ? oxyCODONE-acetaminophen (PERCOCET/ROXICET) 5-325 MG tablet Take 1 tablet by mouth every 6 (six) hours as needed for severe pain. 120 tablet 0  ? pregabalin (LYRICA) 150 MG capsule Take 1 capsule (150 mg total) by mouth 2 (two) times daily. 60 capsule 2  ? tiZANidine (ZANAFLEX) 4 MG tablet Take 1 tablet (4 mg total) by mouth every 8 (eight) hours as  needed for muscle spasms. 90 tablet 5  ? tolterodine (DETROL LA) 4 MG 24 hr capsule Take 4 mg by mouth daily.    ? ?No current facility-administered medications on file prior to visit.  ? ? ? ?BP 134/81 (BP Location: Right Arm, Patient Position: Sitting)   Pulse 85   Resp 20   Ht '5\' 4"'$  (1.626 m)   Wt 152 lb (68.9 kg)   SpO2 96% Comment: RA  BMI 26.09 kg/m?  ? ?Physical Exam ? ?Gen: NAD ?Neck: no carotid bruit ?Heart: RRR ?Lungs: CTA bilaterally ?Abd: soft non-tender, non-distended ?Ext: no edema present ?Neuro: grossly intact ? ?CTA Results: ? ?CLINICAL DATA:  Aortic aneurysm known or suspected. Follow-up ?ascending aortic aneurysm measuring 4.2 cm. ?  ?EXAM: ?CT ANGIOGRAPHY CHEST WITH CONTRAST ?  ?TECHNIQUE: ?Multidetector CT imaging of the chest was performed using the ?standard protocol during bolus administration of intravenous ?contrast. Multiplanar CT image reconstructions and MIPs were ?obtained to evaluate the vascular anatomy. ?  ?RADIATION DOSE REDUCTION: This exam was performed according to the ?departmental dose-optimization program which includes automated ?exposure control, adjustment of the mA and/or kV according to ?patient size and/or use of iterative reconstruction technique. ?  ?  CONTRAST:  131m OMNIPAQUE IOHEXOL 350 MG/ML SOLN ?  ?COMPARISON:  CT chest without contrast 08/08/2021 and 05/22/2020 ?  ?FINDINGS: ?Cardiovascular: Heart size is mildly enlarged. No pericardial ?effusion. Dense coronary artery calcifications are seen. There is ?preferential enhancement of the pulmonary artery. ?  ?The ascending aorta measures up to 4.0 x 3.9 cm (transverse by AP) ?unchanged when measured in a similar manner on multiple prior CTs ?including 05/22/2020. ?  ?Mild atherosclerotic calcifications within the aortic arch. No ?thoracic aortic aneurysm. ?  ?No central pulmonary embolism is seen. ?  ?Mediastinum/Nodes: No axillary, mediastinal or hilar pathologically ?enlarged lymph nodes by CT criteria.  There are subcentimeter short ?axis bilateral hilar lymph nodes. The visualized thyroid and ?esophagus are grossly unremarkable. ?  ?Lungs/Pleura: The central airways are patent. Moderate centrilobular ?emphysematous changes are seen, predominantly within the lung ?apices. Scattered ground-glass subsegmental atelectasis. No pleural ?effusion or pneumothorax. ?  ?Upper Abdomen: Cholecystectomy clips. Splenule abutting the splenic ?hilum. ?  ?Musculoskeletal: Mild-to-moderate multilevel degenerative disc ?changes of the thoracic spine. ?  ?Review of the MIP images confirms the above findings. ?  ?IMPRESSION:: ?IMPRESSION: ?1. Borderline ascending aortic aneurysm measuring up to 4.0 cm in ?caliber, unchanged from 05/22/2020. Recommend annual imaging ?followup by CTA or MRA. This recommendation follows 2010 ?ACCF/AHA/AATS/ACR/ASA/SCA/SCAI/SIR/STS/SVM Guidelines for the ?Diagnosis and Management of Patients with Thoracic Aortic Disease. ?Circulation. 2010; 121:: K440-N027 Aortic aneurysm NOS (ICD10-I71.9) ?2. Moderate centrilobular emphysema. ?  ?Aortic Atherosclerosis (ICD10-I70.0) and Emphysema (ICD10-J43.9). ?  ?Aortic aneurysm NOS (ICD10-I71.9). ?  ?  ?Electronically Signed ?  By: RYvonne KendallM.D. ?  On: 09/17/2021 18:01 ? ?A/P: ? ?Ascending Aortic Aneurysm- measuring 4.0 cm which is stable from 1 year ago, patient will require repeat CTA in 1 year ?HTN- compliant with medications, usually very well controlled ?Nicotine abuse- patient doesn't wish to quit, explained risk with aneurysm ?Chronic Back Pain- can hopefully get some relief soon, she is waiting for injection which is scheduled in next 2 weeks ? ?Risk Modification: ? ?Statin:  Patient refuses ? ?Smoking cessation instruction/counseling given:  counseled patient on the dangers of tobacco use, advised patient to stop smoking, and reviewed strategies to maximize success ? ?Patient was counseled on importance of Blood Pressure Control.  Despite Medical  intervention if the patient notices persistently elevated blood pressure readings.  They are instructed to contact their Primary Care Physician ? ?Please avoid use of Fluoroquinolones as this can potentially increase your risk of Aortic Rupture and/or Dissection ? ?Patient educated on signs and symptoms of Aortic Dissection, handout also provided in AVS ? ?EEllwood Handler PA-C ?10/15/21 ? ? ? ? ?

## 2021-10-15 NOTE — Telephone Encounter (Signed)
Spoke with patient regarding results/recommendations.  

## 2021-10-17 LAB — DRUG MONITORING PANEL 376104, URINE
Amphetamines: NEGATIVE ng/mL (ref <500)
Barbiturates: NEGATIVE ng/mL (ref <300)
Benzodiazepines: NEGATIVE ng/mL (ref <100)
Cocaine Metabolite: NEGATIVE ng/mL (ref <150)
Codeine: NEGATIVE ng/mL (ref <50)
Desmethyltramadol: NEGATIVE ng/mL (ref <100)
Hydrocodone: NEGATIVE ng/mL (ref <50)
Hydromorphone: 101 ng/mL — ABNORMAL HIGH (ref <50)
Morphine: NEGATIVE ng/mL (ref <50)
Norhydrocodone: 257 ng/mL — ABNORMAL HIGH (ref <50)
Noroxycodone: 2200 ng/mL — ABNORMAL HIGH (ref <50)
Opiates: POSITIVE ng/mL — AB (ref <100)
Oxycodone: 666 ng/mL — ABNORMAL HIGH (ref <50)
Oxycodone: POSITIVE ng/mL — AB (ref <100)
Oxymorphone: 1497 ng/mL — ABNORMAL HIGH (ref <50)
Tramadol: NEGATIVE ng/mL (ref <100)

## 2021-10-17 LAB — DM TEMPLATE

## 2021-10-22 NOTE — Telephone Encounter (Signed)
error 

## 2021-10-29 ENCOUNTER — Other Ambulatory Visit: Payer: Self-pay

## 2021-10-29 MED ORDER — LOSARTAN POTASSIUM 100 MG PO TABS
100.0000 mg | ORAL_TABLET | Freq: Every day | ORAL | 0 refills | Status: DC
Start: 2021-10-29 — End: 2022-01-09

## 2021-11-01 DIAGNOSIS — M5416 Radiculopathy, lumbar region: Secondary | ICD-10-CM | POA: Diagnosis not present

## 2021-11-06 ENCOUNTER — Telehealth: Payer: Self-pay

## 2021-11-06 NOTE — Telephone Encounter (Signed)
Patient wants to talk about back pain and where to go from here.  Please call [phone]. ?

## 2021-11-07 ENCOUNTER — Telehealth (INDEPENDENT_AMBULATORY_CARE_PROVIDER_SITE_OTHER): Payer: Medicare Other | Admitting: Family Medicine

## 2021-11-07 ENCOUNTER — Encounter: Payer: Self-pay | Admitting: Family Medicine

## 2021-11-07 DIAGNOSIS — M545 Low back pain, unspecified: Secondary | ICD-10-CM | POA: Diagnosis not present

## 2021-11-07 DIAGNOSIS — M5416 Radiculopathy, lumbar region: Secondary | ICD-10-CM

## 2021-11-07 DIAGNOSIS — Z9889 Other specified postprocedural states: Secondary | ICD-10-CM

## 2021-11-07 MED ORDER — DICLOFENAC SODIUM ER 100 MG PO TB24: 100.0000 mg | ORAL_TABLET | Freq: Every day | ORAL | 1 refills | Status: DC

## 2021-11-07 MED ORDER — PREGABALIN 150 MG PO CAPS: 150.0000 mg | ORAL_CAPSULE | Freq: Two times a day (BID) | ORAL | 1 refills | Status: DC

## 2021-11-07 MED ORDER — OXYCODONE-ACETAMINOPHEN 5-325 MG PO TABS: 1.0000 | ORAL_TABLET | Freq: Three times a day (TID) | ORAL | 0 refills | Status: DC | PRN

## 2021-11-07 NOTE — Telephone Encounter (Signed)
Informed pt that she is to follow neuro for tx and that her PCP will handle pain meds.  ?

## 2021-11-07 NOTE — Progress Notes (Signed)
? ? ? ? ? ?VIRTUAL VISIT VIA VIDEO ? ?I connected with [name] on 11/07/21 at  2:40 PM EDT by a video enabled telemedicine application and verified that I am speaking with the correct person using two identifiers. ?Location patient: Home ?Location provider: [facility], Office ?Persons participating in the virtual visit: Patient, Dr. Raoul Pitch and Cyndra Numbers, [place] ? ?I discussed the limitations of evaluation and management by telemedicine and the availability of in person appointments. The patient expressed understanding and agreed to proceed. ? ? ? ? ? ?[name] , February 21, 1951, 71 y.o., female ?MRN: [id number] ?Patient Care Team  ?  Relationship Specialty Notifications Start End  ?[name], DO PCP - General Family Medicine  01/04/16   ?[name], DPM Consulting Physician Podiatry  01/12/16   ?Ashok Pall, MD Consulting Physician Neurosurgery  10/03/21   ? ? ?Chief Complaint  ?Patient presents with  ? Back Pain  ? ?  ?Subjective: [name] is a 71 y.o. female present for follow up on her back pain.   ?Reports she has had 1 injection completed and received 1 day of relief from pain from that injection.  She reports she has another appointment coming up May 15 for a second injection.  She states that the Lyrica 150 mg twice daily and diclofenac are helping.  She reports if she did not take those first thing in the morning she would not be able to get out of bed.  She has been able to reduce the opioid medication down to twice a day now that Lyrica use has been tapered up.  She reports she is struggling with not having her "life "and being stuck in the house with pain.  She did have a cruise set up for the beginning of May that she will not be able to attend secondary to her medical condition and will require a letter from physician so that she is not held responsible for payment. ? ?Prior note: ?Patient presents today for follow-up on her lumbar pain.  She has been evaluated by neurosurgery  and they are unable to get her in for another 3 weeks to have epidural injection.  Patient is in severe pain and is unable to walk or move without any discomfort.  She states she has been taking the Norco every 4 hours, the gabapentin and the tenacity and as prescribed.  She states she feels like she is living by the clock waiting for the next dose because it is wearing out too soon.  She denies bladder or bowel incontinence at this time. ? ?Prior note: ?She was seen last week after TWO ED visits for back pain. She has NS appt, but no until this coming Monday. She was started on gabapentin, muscle relaxer and opiods by this provider last week. She reports pain is worsening and she has tapered up on gabapentin. She feels the medications help, but pain is still moderate with meds and they Kimm off before it is time for her next dose. She denies bladder or bowel dysfunction.  ? ?Prior note: ?Pt presents for an OV with complaints of low back pain that radiates down to her right foot.  She states her right foot is numb.  However it is pain that radiates down her leg.  She has lower back pain as well and has difficulty straightening her back.  She is walking bent over.  She has a history of lumbar discectomy at L4-L5 and L5-S1  in 2006.  Her neurosurgeon no longer is practicing.  She has been seen twice in the urgent care over the last week and treated with baclofen muscle relaxer and prednisone.  She states this pain has been ongoing for over 2 months.  She did receive minimal relief after steroid injection in the urgent care.  But since then nothing seems to have worked well for her. ? ? ?  09/24/2021  ? 10:12 AM 04/09/2021  ?  9:41 AM 04/12/2020  ?  8:22 AM 02/25/2020  ? 10:34 AM 02/10/2019  ?  8:06 AM  ?Depression screen PHQ 2/9  ?Decreased Interest 0 0 0 0 0  ?Down, Depressed, Hopeless 0 0 0 0 0  ?PHQ - 2 Score 0 0 0 0 0  ? ? ?Allergies  ?Allergen Reactions  ? Sulfa Antibiotics   ? ?Social History  ? ?Social History  Narrative  ? Widower 2018 ([place]). 2 sons.  ? College. Retired.   ? Former smoker (quit 04/2005- 30+ pack year), occasional etoh, no drugs  ? Drinks caffeine, uses herbal remedies, daily vitamin use  ? Wears her seatbelt, wears her bicycle helmet  ? Exercises routinely  ? Smoke detector in the home.   ? Firearms in the home (locked)  ? Feels safe in her relationships.   ? ?Past Medical History:  ?Diagnosis Date  ? Allergic rhinitis   ? Astigmatism   ? right eye  ? Back pain   ? Dr. Karin Lieu ([facility])  ? Basal cell carcinoma   ? Combined form of age-related cataract, both eyes   ? Cystocele   ? 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 No definitive workup completed; prior records  ? Depression   ? Disorder of refraction   ? both eyes  ? Early stage nonexudative age-related macular degeneration of both eyes   ? Exophoria   ? Fibrocystic breast   ? GERD (gastroesophageal reflux disease)   ? History of echocardiogram 12/2012  ? Dr. Daiva Huge: EF55-60%, mild TR, trace pulmonic and MR, abnl left vent diastolic  ? Hypertension   ? Nonexudative age-related macular degeneration, bilateral, early dry stage   ? Piriformis syndrome 05/2013  ? Posterior vitreous detachment, both eyes   ? Postmenopausal   ? Pseudophakia of left eye   ? Psoriasis 2009  ? Sciatica of left side 05/2013  ? TIA (transient ischemic attack) 2000  ? Urinary incontinence   ? ?Past Surgical History:  ?Procedure Laterality Date  ? CHOLECYSTECTOMY  11/2001  ? [place] SURGERY  04/2005  ? R L4-5  and L5-S1  ? SKIN BIOPSY Left 1999  ? left hand bcc vs scc  ? TONSILLECTOMY AND ADENOIDECTOMY    ? ?Family History  ?Problem Relation Age of Onset  ? Rheum arthritis Mother   ? Alcohol abuse Mother   ? Heart disease Father 67  ?     MI at 67; died  ? Diabetes Father   ? Dementia Maternal Grandmother   ? ?Allergies as of 11/07/2021   ? ?   Reactions  ? Sulfa Antibiotics   ? ?  ? ?  ?Medication List  ?  ? ?  ? Accurate as of November 07, 2021  2:56 PM. If you have  any questions, ask your nurse or doctor.  ?  ?  ? ?  ? ?aspirin EC 325 MG tablet ?Take 325 mg by mouth daily. ?  ?Diclofenac Sodium CR 100 MG 24 hr tablet ?Take 1 tablet (  100 mg total) by mouth daily. ?  ?hydrochlorothiazide 50 MG tablet ?Commonly known as: HYDRODIURIL ?TAKE 1 TABLET(50 MG) BY MOUTH DAILY ?  ?losartan 100 MG tablet ?Commonly known as: COZAAR ?Take 1 tablet (100 mg total) by mouth daily. ?  ?MULTIVITAMIN ADULT PO ?Take 1 Dose by mouth. ?  ?NON FORMULARY ?  ?oxyCODONE-acetaminophen 5-325 MG tablet ?Commonly known as: PERCOCET/ROXICET ?Take 1 tablet by mouth every 8 (eight) hours as needed for severe pain. ?What changed: when to take this ?  ?pregabalin 150 MG capsule ?Commonly known as: Lyrica ?Take 1 capsule (150 mg total) by mouth 2 (two) times daily. ?  ?tiZANidine 4 MG tablet ?Commonly known as: Zanaflex ?Take 1 tablet (4 mg total) by mouth every 8 (eight) hours as needed for muscle spasms. ?  ?tolterodine 4 MG 24 hr capsule ?Commonly known as: DETROL LA ?Take 4 mg by mouth daily. ?  ? ?  ? ? ?All past medical history, surgical history, allergies, family history, immunizations andmedications were updated in the EMR today and reviewed under the history and medication portions of their EMR.    ? ?ROS ?Negative, with the exception of above mentioned in HPI ? ? ?Objective:  ?There were no vitals taken for this visit. ?There is no height or weight on file to calculate BMI. ?Physical Exam ?Vitals and nursing note reviewed.  ?Constitutional:   ?   General: She is not in acute distress. ?   Appearance: Normal appearance. She is normal weight. She is not ill-appearing or toxic-appearing.  ?Eyes:  ?   Extraocular Movements: Extraocular movements intact.  ?   Conjunctiva/sclera: Conjunctivae normal.  ?   Pupils: Pupils are equal, round, and reactive to light.  ?Neurological:  ?   Mental Status: She is alert and oriented to person, place, and time. Mental status is at baseline.  ?Psychiatric:     ?   Mood and  Affect: Mood normal.     ?   Behavior: Behavior normal.     ?   Thought Content: Thought content normal.     ?   Judgment: Judgment normal.  ? ? ?No results found. ?No results found. ?No results found for

## 2021-11-07 NOTE — Telephone Encounter (Signed)
Pt sched with PCP today.   ?

## 2021-11-08 NOTE — Telephone Encounter (Signed)
Please advise 

## 2021-11-08 NOTE — Telephone Encounter (Signed)
Please draft a letter for patient.  She reports she needs this in PDF format so that she can email to the cruise line. ?Thanks ? ? ?To Whom it May Concern: ?[name] (DOB 02-24-1951) is under my care for a newly diagnosed medical condition , which unfortunately is going to prevent her from attending her upcoming [employer] (2) on the [employer].  Her travel dates were to be a 5-day cruise, starting on Nov 19, 2021 AND an 8-day cruise, starting Nov 24, 2021.  ? ?Thank you for your understanding in this matter.  It is under medical advisory she not partake in the cruises on above mentioned dates, secondary to her requiring urgent medical intervention and procedures. ? ? ?

## 2021-11-19 ENCOUNTER — Telehealth: Payer: Self-pay

## 2021-11-19 NOTE — Telephone Encounter (Signed)
Patient received letter from [place] with Attending Physician Statement. And the letter submitted to them did not include all the medical info needed to review her claim. ? ?Patient is requesting to be called when form is completed and ready for pick up. ? ? ?Filled out form completion request - placed in [place] 5/8 ?

## 2021-11-19 NOTE — Telephone Encounter (Signed)
Error.   Please see documentation from 4/23 message. ?

## 2021-11-20 ENCOUNTER — Telehealth: Payer: Self-pay | Admitting: Family Medicine

## 2021-11-20 NOTE — Telephone Encounter (Signed)
Form received and placed. On PCP desk for review.  ?

## 2021-11-20 NOTE — Telephone Encounter (Signed)
Completed form and returned to [place] work basket, ?

## 2021-11-21 NOTE — Telephone Encounter (Signed)
Pt informed form is ready for pickup and is at the front desk.  ?

## 2021-11-26 DIAGNOSIS — M5416 Radiculopathy, lumbar region: Secondary | ICD-10-CM | POA: Diagnosis not present

## 2021-11-29 ENCOUNTER — Other Ambulatory Visit: Payer: Self-pay

## 2021-12-17 DIAGNOSIS — Z6826 Body mass index (BMI) 26.0-26.9, adult: Secondary | ICD-10-CM | POA: Diagnosis not present

## 2021-12-17 DIAGNOSIS — M5416 Radiculopathy, lumbar region: Secondary | ICD-10-CM | POA: Diagnosis not present

## 2022-01-07 ENCOUNTER — Other Ambulatory Visit: Payer: Self-pay

## 2022-01-07 MED ORDER — HYDROCHLOROTHIAZIDE 50 MG PO TABS: ORAL_TABLET | ORAL | 0 refills | Status: DC

## 2022-01-09 ENCOUNTER — Telehealth: Payer: Self-pay

## 2022-01-09 MED ORDER — LOSARTAN POTASSIUM 100 MG PO TABS: 100.0000 mg | ORAL_TABLET | Freq: Every day | ORAL | 0 refills | Status: DC

## 2022-01-09 NOTE — Telephone Encounter (Signed)
Patient refill request. Patient states she is leaving to go out of town tomorrow and needs refills called in today.  I told patient that Dr. Raoul Pitch is in clinic today, and will see patients that are in office, first, before approving refills, messages, etc.  She said "okay,I know. But please try."  [facility]  losartan (COZAAR) 100 MG tablet [[id number]]   pregabalin (LYRICA) 150 MG capsule [[id number]]

## 2022-01-09 NOTE — Telephone Encounter (Signed)
Pt sched for [facility] appt. Pt also informed that the Lyrica has refills at the pharmact. 30 d/s of losartan given to pt til upcoming appt

## 2022-01-21 DIAGNOSIS — M5416 Radiculopathy, lumbar region: Secondary | ICD-10-CM | POA: Diagnosis not present

## 2022-01-23 ENCOUNTER — Encounter: Payer: Self-pay | Admitting: Family Medicine

## 2022-01-23 ENCOUNTER — Ambulatory Visit (INDEPENDENT_AMBULATORY_CARE_PROVIDER_SITE_OTHER): Payer: Medicare Other | Admitting: Family Medicine

## 2022-01-23 VITALS — BP 160/82 | HR 68 | Temp 98.6°F | Ht 64.0 in | Wt 162.0 lb

## 2022-01-23 DIAGNOSIS — Z9889 Other specified postprocedural states: Secondary | ICD-10-CM | POA: Diagnosis not present

## 2022-01-23 DIAGNOSIS — F17200 Nicotine dependence, unspecified, uncomplicated: Secondary | ICD-10-CM | POA: Diagnosis not present

## 2022-01-23 DIAGNOSIS — R2990 Unspecified symptoms and signs involving the nervous system: Secondary | ICD-10-CM | POA: Diagnosis not present

## 2022-01-23 DIAGNOSIS — Z79899 Other long term (current) drug therapy: Secondary | ICD-10-CM

## 2022-01-23 DIAGNOSIS — E782 Mixed hyperlipidemia: Secondary | ICD-10-CM | POA: Diagnosis not present

## 2022-01-23 DIAGNOSIS — I10 Essential (primary) hypertension: Secondary | ICD-10-CM | POA: Diagnosis not present

## 2022-01-23 DIAGNOSIS — M5416 Radiculopathy, lumbar region: Secondary | ICD-10-CM | POA: Diagnosis not present

## 2022-01-23 LAB — CBC
HCT: 39.1 % (ref 36.0–46.0)
Hemoglobin: 13.9 g/dL (ref 12.0–15.0)
MCHC: 35.6 g/dL (ref 30.0–36.0)
MCV: 93 fl (ref 78.0–100.0)
Platelets: 159 10*3/uL (ref 150.0–400.0)
RBC: 4.2 Mil/uL (ref 3.87–5.11)
RDW: 13.2 % (ref 11.5–15.5)
WBC: 11 10*3/uL — ABNORMAL HIGH (ref 4.0–10.5)

## 2022-01-23 LAB — HEMOGLOBIN A1C: Hgb A1c MFr Bld: 5.2 % (ref 4.6–6.5)

## 2022-01-23 MED ORDER — HYDROCHLOROTHIAZIDE 50 MG PO TABS: ORAL_TABLET | ORAL | 1 refills | Status: DC

## 2022-01-23 MED ORDER — DICLOFENAC SODIUM ER 100 MG PO TB24: 100.0000 mg | ORAL_TABLET | Freq: Every day | ORAL | 1 refills | Status: DC

## 2022-01-23 MED ORDER — OXYCODONE-ACETAMINOPHEN 5-325 MG PO TABS: 1.0000 | ORAL_TABLET | Freq: Every day | ORAL | 0 refills | Status: DC | PRN

## 2022-01-23 MED ORDER — AMLODIPINE BESYLATE 2.5 MG PO TABS: 2.5000 mg | ORAL_TABLET | Freq: Every day | ORAL | 0 refills | Status: DC

## 2022-01-23 MED ORDER — PREGABALIN 150 MG PO CAPS: 150.0000 mg | ORAL_CAPSULE | Freq: Two times a day (BID) | ORAL | 1 refills | Status: DC

## 2022-01-23 MED ORDER — TIZANIDINE HCL 4 MG PO TABS: 4.0000 mg | ORAL_TABLET | Freq: Three times a day (TID) | ORAL | 5 refills | Status: DC | PRN

## 2022-01-23 MED ORDER — LOSARTAN POTASSIUM 100 MG PO TABS: 100.0000 mg | ORAL_TABLET | Freq: Every day | ORAL | 1 refills | Status: DC

## 2022-01-23 NOTE — Patient Instructions (Addendum)
Return in about 4 weeks (around 02/20/2022) for bp recheck woth provider.        Great to see you today.  I have refilled the medication(s) we provide.   If labs were collected, we will inform you of lab results once received either by echart message or telephone call.   - echart message- for normal results that have been seen by the patient already.   - telephone call: abnormal results or if patient has not viewed results in their echart.

## 2022-01-23 NOTE — Progress Notes (Signed)
/   [name] , 1950/08/19, 71 y.o., female MRN: [id number] Patient Care Team    Relationship Specialty Notifications Start End  [name], DO PCP - General Family Medicine  01/04/16   [name], DPM Consulting Physician Podiatry  01/12/16   Ashok Pall, MD Consulting Physician Neurosurgery  10/03/21     Chief Complaint  Patient presents with   Hypertension    Cmc; pt is not fasting    Subjective: Pt presents for routine OV follow up on [facility] Hypertension/morbid obesity/hyperlipidemia: Pt reports compliance with Cozaar 100  QD and HCTZ 50 qd. Patient denies chest pain, shortness of breath, dizziness or lower extremity edema.  Pt takes daily ASA. Pt is not prescribed statin. Exercise: routine exercise.  RF: Hypertension, history of TIA, smoker, family history of heart disease  Arthritis/lumbar radiculopathy: Patient reports she has just received her third injection in her back earlier this week.  So far she feels like the injections are working for her.  She rarely needs the oxycodone now.  She states she may take 1 every couple weeks if she is having a bad day but she has not really been using it that much.  She is compliant with Lyrica 150 mg twice daily and feels like this is have been very helpful.      09/24/2021   10:12 AM 04/09/2021    9:41 AM 04/12/2020    8:22 AM 02/25/2020   10:34 AM 02/10/2019    8:06 AM  Depression screen PHQ 2/9  Decreased Interest 0 0 0 0 0  Down, Depressed, Hopeless 0 0 0 0 0  PHQ - 2 Score 0 0 0 0 0      01/08/2017    3:43 PM  GAD 7 : Generalized Anxiety Score  Nervous, Anxious, on Edge 2  Control/stop worrying 2  Worry too much - different things 0  Trouble relaxing 0  Restless 0  Easily annoyed or irritable 0  Afraid - awful might happen 0  Total GAD 7 Score 4  Anxiety Difficulty Very difficult    Allergies  Allergen Reactions   Sulfa Antibiotics    Social History   Tobacco Use   Smoking status: Every Day     Packs/day: 1.00    Years: 3.00    Total pack years: 3.00    Types: Cigarettes   Smokeless tobacco: Never  Substance Use Topics   Alcohol use: No    Alcohol/week: 0.0 standard drinks of alcohol   Past Medical History:  Diagnosis Date   Allergic rhinitis    Astigmatism    right eye   Back pain    Dr. 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   Posterior vitreous detachment, both eyes    Postmenopausal    Pseudophakia of left eye    Psoriasis  2009   Sciatica of left side 05/2013   TIA (transient ischemic attack) 2000   Urinary incontinence    Past Surgical History:  Procedure Laterality Date   CHOLECYSTECTOMY  11/2001   LUMBAR DISC SURGERY  04/2005   R L4-5  and L5-S1   SKIN BIOPSY Left 1999   left hand bcc vs scc   TONSILLECTOMY AND ADENOIDECTOMY     Family History  Problem Relation Age of Onset   Rheum arthritis Mother    Alcohol abuse Mother    Heart disease Father 15       MI at 17; died   Diabetes Father    Dementia Maternal Grandmother    Allergies as of 01/23/2022       Reactions   Sulfa Antibiotics         Medication List        Accurate as of January 23, 2022  6:20 PM. If you have any questions, ask your nurse or doctor.          STOP taking these medications    NON FORMULARY Stopped by: Howard Pouch, DO       TAKE these medications     amLODipine 2.5 MG tablet Commonly known as: NORVASC Take 1 tablet (2.5 mg total) by mouth daily. Started by: Howard Pouch, DO   aspirin EC 325 MG tablet Take 325 mg by mouth daily.   Diclofenac Sodium CR 100 MG 24 hr tablet Take 1 tablet (100 mg total) by mouth daily.   hydrochlorothiazide 50 MG tablet Commonly known as: HYDRODIURIL TAKE 1 TABLET(50 MG) BY MOUTH DAILY   losartan 100 MG tablet Commonly known as: COZAAR Take 1 tablet (100 mg total) by mouth daily.   MULTIVITAMIN ADULT PO Take 1 Dose by mouth.   oxyCODONE-acetaminophen 5-325 MG tablet Commonly known as: PERCOCET/ROXICET Take 1 tablet by mouth daily as needed for severe pain. What changed: when to take this Changed by: Howard Pouch, DO   pregabalin 150 MG capsule Commonly known as: Lyrica Take 1 capsule (150 mg total) by mouth 2 (two) times daily.   tiZANidine 4 MG tablet Commonly known as: Zanaflex Take 1 tablet (4 mg total) by mouth every 8 (eight) hours as needed for muscle spasms.   tolterodine 4 MG 24 hr capsule Commonly known as: DETROL LA Take 4 mg by mouth daily.        Results for orders placed or performed in visit on 01/23/22 (from the past 24 hour(s))  CBC     Status: Abnormal   Collection Time: 01/23/22  2:03 PM  Result Value Ref Range   WBC 11.0 (H) 4.0 - 10.5 K/uL   RBC 4.20 3.87 - 5.11 Mil/uL   Platelets 159.0 150.0 - 400.0 K/uL   Hemoglobin 13.9 12.0 - 15.0 g/dL   HCT 39.1 36.0 - 46.0 %   MCV 93.0 78.0 - 100.0 fl   MCHC 35.6 30.0 - 36.0 g/dL   RDW 13.2 11.5 - 15.5 %  Hemoglobin A1c     Status: None   Collection Time: 01/23/22  2:03 PM  Result Value Ref Range   Hgb A1c MFr Bld 5.2 4.6 - 6.5 %   No results found.   ROS: Negative, with the exception of above mentioned in HPI  Objective:  BP (!) 160/82   Pulse 68   Temp 98.6 F (37 C) (Oral)   Ht 5' 4"  (1.626 m)   Wt 162 lb (73.5 kg)   SpO2 97%  BMI 27.81 kg/m  Body mass index is 27.81 kg/m. Physical  Exam Vitals and nursing note reviewed.  Constitutional:      General: She is not in acute distress.    Appearance: Normal appearance. She is not ill-appearing, toxic-appearing or diaphoretic.  HENT:     Head: Normocephalic and atraumatic.  Eyes:     General: No scleral icterus.       Right eye: No discharge.        Left eye: No discharge.     Extraocular Movements: Extraocular movements intact.     Conjunctiva/sclera: Conjunctivae normal.     Pupils: Pupils are equal, round, and reactive to light.  Cardiovascular:     Rate and Rhythm: Normal rate and regular rhythm.  Pulmonary:     Effort: Pulmonary effort is normal. No respiratory distress.     Breath sounds: Normal breath sounds. No wheezing, rhonchi or rales.  Musculoskeletal:     Cervical back: Neck supple. No tenderness.     Right lower leg: No edema.     Left lower leg: No edema.  Lymphadenopathy:     Cervical: No cervical adenopathy.  Skin:    General: Skin is warm and dry.     Coloration: Skin is not jaundiced or pale.     Findings: No erythema or rash.  Neurological:     Mental Status: She is alert and oriented to person, place, and time. Mental status is at baseline.     Motor: No weakness.     Gait: Gait normal.  Psychiatric:        Mood and Affect: Mood normal.        Behavior: Behavior normal.        Thought Content: Thought content normal.        Judgment: Judgment normal.     Results for orders placed or performed in visit on 01/23/22 (from the past 24 hour(s))  CBC     Status: Abnormal   Collection Time: 01/23/22  2:03 PM  Result Value Ref Range   WBC 11.0 (H) 4.0 - 10.5 K/uL   RBC 4.20 3.87 - 5.11 Mil/uL   Platelets 159.0 150.0 - 400.0 K/uL   Hemoglobin 13.9 12.0 - 15.0 g/dL   HCT 39.1 36.0 - 46.0 %   MCV 93.0 78.0 - 100.0 fl   MCHC 35.6 30.0 - 36.0 g/dL   RDW 13.2 11.5 - 15.5 %  Hemoglobin A1c     Status: None   Collection Time: 01/23/22  2:03 PM  Result Value Ref Range   Hgb A1c MFr Bld 5.2  4.6 - 6.5 %    Assessment/Plan: [name] is a 71 y.o. female present for OV for  Essential hypertension, benign/morbid obesity/hyperlipidemia/history of TIA/long-term current med use - Has been above goal> not related to pain now, may be partially from steroid injection last week, but has been elevated since Jan.  - added amlodipine 2.5 mg qd - Continue losartan 100  -Continue HCTZ 50 mg daily. - continue ASA 325 - low sodium, exercise.  -CBC, CMP, A1c, LDL, TSH collected today - Follow-up 5.5 months  Arthritis/lumbar radiculopathy:  Improving since she has recently received her third steroid injection.   Continue diclofenac (stopped Mobic) Continue Zanaflex Continue Lyrica 150 twice daily We will provide 1 additional prescription of her oxycodone to keep on hand, in the event of a flare.  She understands this is a one-time prescription and should not be used routinely. [place] controlled  substance based reviewed today and appropriate Continue follow-ups with her neurosurgery team   Return in about 4 weeks (around 02/20/2022) for bp recheck woth provider. Once BP is normal return to every 54-monthfollow-ups. Orders Placed This Encounter  Procedures   CBC   Comp Met (CMET)   TSH   Direct LDL   Hemoglobin A1c    Meds ordered this encounter  Medications   hydrochlorothiazide (HYDRODIURIL) 50 MG tablet    Sig: TAKE 1 TABLET(50 MG) BY MOUTH DAILY    Dispense:  90 tablet    Refill:  1   losartan (COZAAR) 100 MG tablet    Sig: Take 1 tablet (100 mg total) by mouth daily.    Dispense:  90 tablet    Refill:  1   Diclofenac Sodium CR 100 MG 24 hr tablet    Sig: Take 1 tablet (100 mg total) by mouth daily.    Dispense:  90 tablet    Refill:  1    DC mobic   tiZANidine (ZANAFLEX) 4 MG tablet    Sig: Take 1 tablet (4 mg total) by mouth every 8 (eight) hours as needed for muscle spasms.    Dispense:  90 tablet    Refill:  5   amLODipine (NORVASC) 2.5 MG tablet     Sig: Take 1 tablet (2.5 mg total) by mouth daily.    Dispense:  90 tablet    Refill:  0   pregabalin (LYRICA) 150 MG capsule    Sig: Take 1 capsule (150 mg total) by mouth 2 (two) times daily.    Dispense:  180 capsule    Refill:  1   oxyCODONE-acetaminophen (PERCOCET/ROXICET) 5-325 MG tablet    Sig: Take 1 tablet by mouth daily as needed for severe pain.    Dispense:  30 tablet    Refill:  0       electronically signed by:  RHoward Pouch DO  [place]

## 2022-01-24 ENCOUNTER — Telehealth: Payer: Self-pay | Admitting: Family Medicine

## 2022-01-24 LAB — TSH: TSH: 1.48 u[IU]/mL (ref 0.35–5.50)

## 2022-01-24 LAB — COMPREHENSIVE METABOLIC PANEL
ALT: 13 U/L (ref 0–35)
AST: 16 U/L (ref 0–37)
Albumin: 4.2 g/dL (ref 3.5–5.2)
Alkaline Phosphatase: 59 U/L (ref 39–117)
BUN: 27 mg/dL — ABNORMAL HIGH (ref 6–23)
CO2: 27 mEq/L (ref 19–32)
Calcium: 9.4 mg/dL (ref 8.4–10.5)
Chloride: 97 mEq/L (ref 96–112)
Creatinine, Ser: 0.64 mg/dL (ref 0.40–1.20)
GFR: 88.21 mL/min (ref >60.00)
Glucose, Bld: 109 mg/dL — ABNORMAL HIGH (ref 70–99)
Potassium: 3.9 mEq/L (ref 3.5–5.1)
Sodium: 131 mEq/L — ABNORMAL LOW (ref 135–145)
Total Bilirubin: 0.7 mg/dL (ref 0.2–1.2)
Total Protein: 6.4 g/dL (ref 6.0–8.3)

## 2022-01-24 LAB — LDL CHOLESTEROL, DIRECT: Direct LDL: 91 mg/dL

## 2022-01-24 NOTE — Telephone Encounter (Signed)
Please call patient Liver, kidney and thyroid function are normal Blood cell counts are normal.  Her WBCs look just very mildly elevated, but this is from the recent steroid shot and is not concerning. Diabetes screening/A1c is normal  Cholesterol looks good and is at goal.  Her sodium is mildly low at 131.  This is new for her.  Low sodium can cause symptoms of dizziness, fatigue, increased thirst, headache, nausea or feeling like a mental fog/confusion.  Are as is as not extremely low, but it is the first time I have noticed it be abnormal for her.  I would encourage her to use electrolyte replacement for hydration.  This is like Gatorade2, propel electrolyte water or add a serving of V8 juice daily will also help.  If she notices any of the symptoms mentioned above, I would like her to be seen immediately for further evaluation.   -Most likely this is from the Lyrica medication.  This type of medication can cause lower in sodium in some people.  Following the above instructions with electrolyte replacement usually is sufficient enough to keep levels normal.

## 2022-01-24 NOTE — Telephone Encounter (Signed)
Spoke with pt regarding labs and instructions.   

## 2022-02-27 ENCOUNTER — Ambulatory Visit (INDEPENDENT_AMBULATORY_CARE_PROVIDER_SITE_OTHER): Payer: Medicare Other | Admitting: Family Medicine

## 2022-02-27 ENCOUNTER — Encounter: Payer: Self-pay | Admitting: Family Medicine

## 2022-02-27 VITALS — BP 123/76 | HR 68 | Temp 97.6°F | Ht 64.0 in | Wt 158.2 lb

## 2022-02-27 DIAGNOSIS — Z23 Encounter for immunization: Secondary | ICD-10-CM | POA: Diagnosis not present

## 2022-02-27 DIAGNOSIS — I10 Essential (primary) hypertension: Secondary | ICD-10-CM | POA: Diagnosis not present

## 2022-02-27 MED ORDER — AMLODIPINE BESYLATE 2.5 MG PO TABS: 2.5000 mg | ORAL_TABLET | Freq: Every day | ORAL | 1 refills | Status: DC

## 2022-02-27 NOTE — Patient Instructions (Signed)
Return in about 4 months (around 06/18/2022) for Routine chronic condition follow-up.        Great to see you today.  I have refilled the medication(s) we provide.   If labs were collected, we will inform you of lab results once received either by echart message or telephone call.   - echart message- for normal results that have been seen by the patient already.   - telephone call: abnormal results or if patient has not viewed results in their echart.

## 2022-02-27 NOTE — Progress Notes (Unsigned)
/   [name] , May 18, 1951, 71 y.o., female MRN: [id number] Patient Care Team    Relationship Specialty Notifications Start End  [name], DO PCP - General Family Medicine  01/04/16   [name], DPM Consulting Physician Podiatry  01/12/16   Ashok Pall, MD Consulting Physician Neurosurgery  10/03/21     Chief Complaint  Patient presents with   Hypertension    Subjective: Pt presents for routine OV follow up on [facility] Hypertension/morbid obesity/hyperlipidemia: Pt reports compliance with Cozaar 100  QD, amlodipine 2.5 mg (added last visit) and HCTZ 50 qd. Patient denies chest pain, shortness of breath, dizziness or lower extremity edema.  Exercise: routine exercise.  RF: Hypertension, history of TIA, smoker, family history of heart disease  Arthritis/lumbar radiculopathy: Patient reports she has just received her third injection in her back earlier this week.  So far she feels like the injections are working for her.  She rarely needs the oxycodone now.  She states she may take 1 every couple weeks if she is having a bad day but she has not really been using it that much.  She is compliant with Lyrica 150 mg twice daily and feels like this is have been very helpful.      09/24/2021   10:12 AM 04/09/2021    9:41 AM 04/12/2020    8:22 AM 02/25/2020   10:34 AM 02/10/2019    8:06 AM  Depression screen PHQ 2/9  Decreased Interest 0 0 0 0 0  Down, Depressed, Hopeless 0 0 0 0 0  PHQ - 2 Score 0 0 0 0 0      01/08/2017    3:43 PM  GAD 7 : Generalized Anxiety Score  Nervous, Anxious, on Edge 2  Control/stop worrying 2  Worry too much - different things 0  Trouble relaxing 0  Restless 0  Easily annoyed or irritable 0  Afraid - awful might happen 0  Total GAD 7 Score 4  Anxiety Difficulty Very difficult    Allergies  Allergen Reactions   Sulfa Antibiotics    Social History   Tobacco Use   Smoking status: Every Day    Packs/day: 1.00    Years: 3.00    Total  pack years: 3.00    Types: Cigarettes   Smokeless tobacco: Never  Substance Use Topics   Alcohol use: No    Alcohol/week: 0.0 standard drinks of alcohol   Past Medical History:  Diagnosis Date   Allergic rhinitis    Astigmatism    right eye   Back pain    Dr. 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   Posterior vitreous detachment, both eyes    Postmenopausal    Pseudophakia of left eye    Psoriasis 2009   Sciatica of left side 05/2013   TIA (  transient ischemic attack) 2000   Urinary incontinence    Past Surgical History:  Procedure Laterality Date   CHOLECYSTECTOMY  11/2001   LUMBAR DISC SURGERY  04/2005   R L4-5  and L5-S1   SKIN BIOPSY Left 1999   left hand bcc vs scc   TONSILLECTOMY AND ADENOIDECTOMY     Family History  Problem Relation Age of Onset   Rheum arthritis Mother    Alcohol abuse Mother    Heart disease Father 7       MI at 43; died   Diabetes Father    Dementia Maternal Grandmother    Allergies as of 02/27/2022       Reactions   Sulfa Antibiotics         Medication List        Accurate as of February 27, 2022  1:16 PM. If you have any questions, ask your nurse or doctor.          STOP taking these medications    oxyCODONE-acetaminophen 5-325 MG tablet Commonly known as: PERCOCET/ROXICET Stopped by: Howard Pouch, DO       TAKE  these medications    amLODipine 2.5 MG tablet Commonly known as: NORVASC Take 1 tablet (2.5 mg total) by mouth daily.   aspirin EC 325 MG tablet Take 325 mg by mouth daily.   Diclofenac Sodium CR 100 MG 24 hr tablet Take 1 tablet (100 mg total) by mouth daily.   hydrochlorothiazide 50 MG tablet Commonly known as: HYDRODIURIL TAKE 1 TABLET(50 MG) BY MOUTH DAILY   losartan 100 MG tablet Commonly known as: COZAAR Take 1 tablet (100 mg total) by mouth daily.   MULTIVITAMIN ADULT PO Take 1 Dose by mouth.   pregabalin 150 MG capsule Commonly known as: Lyrica Take 1 capsule (150 mg total) by mouth 2 (two) times daily.   tiZANidine 4 MG tablet Commonly known as: Zanaflex Take 1 tablet (4 mg total) by mouth every 8 (eight) hours as needed for muscle spasms.   tolterodine 4 MG 24 hr capsule Commonly known as: DETROL LA Take 4 mg by mouth daily.        No results found for this or any previous visit (from the past 24 hour(s)).  No results found.   ROS: Negative, with the exception of above mentioned in HPI  Objective:  BP 123/76   Pulse 68   Temp 97.6 F (36.4 C)   Ht '5\' 4"'$  (1.626 m)   Wt 158 lb 3.2 oz (71.8 kg)   SpO2 95%   BMI 27.15 kg/m  Body mass index is 27.15 kg/m. Physical Exam Vitals and nursing note reviewed.  Constitutional:      General: She is not in acute distress.    Appearance: Normal appearance. She is not ill-appearing, toxic-appearing or diaphoretic.  HENT:     Head: Normocephalic and atraumatic.  Eyes:     General: No scleral icterus.       Right eye: No discharge.        Left eye: No discharge.     Extraocular Movements: Extraocular movements intact.     Conjunctiva/sclera: Conjunctivae normal.     Pupils: Pupils are equal, round, and reactive to light.  Cardiovascular:     Rate and Rhythm: Normal rate and regular rhythm.  Pulmonary:     Effort: Pulmonary effort is normal. No respiratory distress.     Breath sounds: Normal breath  sounds. No wheezing, rhonchi or rales.  Musculoskeletal:  Cervical back: Neck supple. No tenderness.     Right lower leg: No edema.     Left lower leg: No edema.  Lymphadenopathy:     Cervical: No cervical adenopathy.  Skin:    General: Skin is warm and dry.     Coloration: Skin is not jaundiced or pale.     Findings: No erythema or rash.  Neurological:     Mental Status: She is alert and oriented to person, place, and time. Mental status is at baseline.     Motor: No weakness.     Gait: Gait normal.  Psychiatric:        Mood and Affect: Mood normal.        Behavior: Behavior normal.        Thought Content: Thought content normal.        Judgment: Judgment normal.     No results found for this or any previous visit (from the past 24 hour(s)).   Assessment/Plan: [name] is a 71 y.o. female present for OV for  Essential hypertension, benign/morbid obesity/hyperlipidemia/history of TIA/long-term current med use - stable-much improved - continue  amlodipine 2.5 mg qd - Continue losartan 100  -Continue HCTZ 50 mg daily. - continue ASA 325 - low sodium, exercise.  - UTD - Follow-up 5.5 months  (Documentation only) Arthritis/lumbar radiculopathy:  Improving since she has recently received her third steroid injection.   Continue diclofenac (stopped Mobic) Continue Zanaflex Continue Lyrica 150 twice daily We will provide 1 additional prescription of her oxycodone to keep on hand, in the event of a flare.  She understands this is a one-time prescription and should not be used routinely. [place] controlled substance based reviewed today and appropriate Continue follow-ups with her neurosurgery team  Pneumonia vaccine needed: Prevnar 20 completed today. Return in about 4 months (around 06/18/2022) for Routine chronic condition follow-up.  No orders of the defined types were placed in this encounter.   Meds ordered this encounter  Medications   amLODipine  (NORVASC) 2.5 MG tablet    Sig: Take 1 tablet (2.5 mg total) by mouth daily.    Dispense:  90 tablet    Refill:  1       electronically signed by:  Howard Pouch, DO  [place]

## 2022-03-04 ENCOUNTER — Telehealth: Payer: Self-pay

## 2022-03-04 MED ORDER — BENZONATATE 200 MG PO CAPS: 200.0000 mg | ORAL_CAPSULE | Freq: Two times a day (BID) | ORAL | 0 refills | Status: DC | PRN

## 2022-03-04 NOTE — Telephone Encounter (Signed)
Called in [place] for her to take as needed.  She can also try over-the-counter Delsym. All other cough syrups that we could prescribed are available over-the-counter with the exceptions of cough syrups that have controlled substance in them.  Those of which, would legally require a face-to-face visit in order to prescribe.  If her symptoms are not improving, I recommend she follow-up and we would need to work-up her acute illness.  At the time of her appointment she said she was getting over summer cold.  Patient is a smoker as well.

## 2022-03-04 NOTE — Addendum Note (Signed)
Addended by: Howard Pouch A on: 03/04/2022 12:18 PM   Modules accepted: Orders

## 2022-03-04 NOTE — Telephone Encounter (Signed)
Spoke with pt regarding med and instructions.

## 2022-03-04 NOTE — Telephone Encounter (Signed)
Do not see in note. Please advise.

## 2022-03-04 NOTE — Telephone Encounter (Signed)
Pt called stating that her cough has is not getting any better. States that she has done 7 days of mucinex and it has not helped. She is now experiencing rib pain because she can not stop coughing. Requesting to have cough med called in.

## 2022-03-08 ENCOUNTER — Ambulatory Visit (INDEPENDENT_AMBULATORY_CARE_PROVIDER_SITE_OTHER): Payer: Medicare Other | Admitting: Family Medicine

## 2022-03-08 ENCOUNTER — Encounter: Payer: Self-pay | Admitting: Family Medicine

## 2022-03-08 VITALS — BP 147/70 | HR 84 | Temp 98.2°F | Ht 64.0 in | Wt 157.0 lb

## 2022-03-08 DIAGNOSIS — J209 Acute bronchitis, unspecified: Secondary | ICD-10-CM | POA: Diagnosis not present

## 2022-03-08 DIAGNOSIS — R051 Acute cough: Secondary | ICD-10-CM

## 2022-03-08 MED ORDER — BENZONATATE 200 MG PO CAPS: 200.0000 mg | ORAL_CAPSULE | Freq: Two times a day (BID) | ORAL | 0 refills | Status: DC | PRN

## 2022-03-08 MED ORDER — PREDNISONE 20 MG PO TABS: 40.0000 mg | ORAL_TABLET | Freq: Every day | ORAL | 0 refills | Status: DC

## 2022-03-08 MED ORDER — DOXYCYCLINE HYCLATE 100 MG PO TABS: 100.0000 mg | ORAL_TABLET | Freq: Two times a day (BID) | ORAL | 0 refills | Status: DC

## 2022-03-08 MED ORDER — PREDNISONE 20 MG PO TABS: 40.0000 mg | ORAL_TABLET | Freq: Every day | ORAL | 0 refills | Status: AC

## 2022-03-08 NOTE — Progress Notes (Signed)
[name] , 1951/01/17, 71 y.o., female MRN: [id number] Patient Care Team    Relationship Specialty Notifications Start End  [name], DO PCP - General Family Medicine  01/04/16   [name], DPM Consulting Physician Podiatry  01/12/16   Ashok Pall, MD Consulting Physician Neurosurgery  10/03/21     Chief Complaint  Patient presents with   Cough    Pt states sx unchanged; pt some relief with meds     Subjective: Pt presents for an OV with complaints of cystic cough and phlegm production.  Patient was seen for her chronic conditions approximately 2 weeks ago.  At that time she stated she was getting every summer cold.  She reports she thought she was getting better, but her symptoms seem to rebound.  She is a smoker. She has tried over-the-counter products Mucinex DM and cough syrups.  We had prescribed her Tessalon Perles as well to help.  She states she is coughed so much her ribs are sore.  She is producing phlegm.     09/24/2021   10:12 AM 04/09/2021    9:41 AM 04/12/2020    8:22 AM 02/25/2020   10:34 AM 02/10/2019    8:06 AM  Depression screen PHQ 2/9  Decreased Interest 0 0 0 0 0  Down, Depressed, Hopeless 0 0 0 0 0  PHQ - 2 Score 0 0 0 0 0    Allergies  Allergen Reactions   Sulfa Antibiotics    Social History   Social History Narrative   Widower 2018 (Damon). 2 sons.   College. Retired.    Former smoker (quit 04/2005- 30+ pack year), occasional etoh, no drugs   Drinks caffeine, uses herbal remedies, daily vitamin use   Wears her seatbelt, wears her bicycle helmet   Exercises routinely   Smoke detector in the home.    Firearms in the home (locked)   Feels safe in her relationships.    Past Medical History:  Diagnosis Date   Allergic rhinitis    Astigmatism    right eye   Back pain    Dr. Karin Lieu ([facility])   Basal cell carcinoma    Combined form of age-related cataract, both eyes    Cystocele    Demyelinating  disease ([place]) 2003   No definitive workup completed; prior records   Depression    Disorder of refraction    both eyes   Early stage nonexudative age-related macular degeneration of both eyes    Exophoria    Fibrocystic breast    GERD (gastroesophageal reflux disease)    History of echocardiogram 12/2012   Dr. 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   Posterior vitreous detachment, both eyes    Postmenopausal    Pseudophakia of left eye    Psoriasis 2009   Sciatica of left side 05/2013   TIA (transient ischemic attack) 2000   Urinary incontinence    Past Surgical History:  Procedure Laterality Date   CHOLECYSTECTOMY  11/2001   LUMBAR DISC SURGERY  04/2005   R L4-5  and L5-S1   SKIN BIOPSY Left 1999   left hand bcc vs scc   TONSILLECTOMY AND ADENOIDECTOMY     Family History  Problem Relation Age of Onset   Rheum arthritis Mother    Alcohol abuse  Mother    Heart disease Father 67       MI at 71; died   Diabetes Father    Dementia Maternal Grandmother    Allergies as of 03/08/2022       Reactions   Sulfa Antibiotics         Medication List        Accurate as of March 08, 2022  3:40 PM. If you have any questions, ask your nurse or doctor.          amLODipine 2.5 MG tablet Commonly known as: NORVASC Take 1 tablet (2.5 mg total) by mouth daily.   aspirin EC 325 MG tablet Take 325 mg by mouth daily.   benzonatate 200 MG capsule Commonly known as: TESSALON Take 1 capsule (200 mg total) by mouth 2 (two) times daily as needed for cough.   Diclofenac Sodium CR 100 MG 24 hr tablet Take 1 tablet (100 mg total) by mouth daily.   doxycycline 100 MG tablet Commonly known as: VIBRA-TABS Take 1 tablet (100 mg total) by mouth 2 (two) times daily. Started by: Howard Pouch, DO   hydrochlorothiazide 50 MG  tablet Commonly known as: HYDRODIURIL TAKE 1 TABLET(50 MG) BY MOUTH DAILY   losartan 100 MG tablet Commonly known as: COZAAR Take 1 tablet (100 mg total) by mouth daily.   MULTIVITAMIN ADULT PO Take 1 Dose by mouth.   predniSONE 20 MG tablet Commonly known as: DELTASONE Take 2 tablets (40 mg total) by mouth daily for 5 days. Started by: Howard Pouch, DO   pregabalin 150 MG capsule Commonly known as: Lyrica Take 1 capsule (150 mg total) by mouth 2 (two) times daily.   tiZANidine 4 MG tablet Commonly known as: Zanaflex Take 1 tablet (4 mg total) by mouth every 8 (eight) hours as needed for muscle spasms.   tolterodine 4 MG 24 hr capsule Commonly known as: DETROL LA Take 4 mg by mouth daily.        All past medical history, surgical history, allergies, family history, immunizations andmedications were updated in the EMR today and reviewed under the history and medication portions of their EMR.     ROS Negative, with the exception of above mentioned in HPI   Objective:  BP (!) 147/70   Pulse 84   Temp 98.2 F (36.8 C) (Oral)   Ht '5\' 4"'$  (1.626 m)   Wt 157 lb (71.2 kg)   SpO2 96%   BMI 26.95 kg/m  Body mass index is 26.95 kg/m. Physical Exam Vitals and nursing note reviewed.  Constitutional:      General: She is not in acute distress.    Appearance: Normal appearance. She is normal weight. She is not ill-appearing or toxic-appearing.  HENT:     Head: Normocephalic and atraumatic.     Right Ear: Tympanic membrane and ear canal normal.     Left Ear: Tympanic membrane and ear canal normal.     Nose: Congestion and rhinorrhea present.     Mouth/Throat:     Pharynx: No oropharyngeal exudate or posterior oropharyngeal erythema.  Eyes:     General:        Right eye: No discharge.        Left eye: No discharge.     Extraocular Movements: Extraocular movements intact.     Conjunctiva/sclera: Conjunctivae normal.     Pupils: Pupils are equal, round, and reactive to  light.  Cardiovascular:     Rate and Rhythm: Normal rate  and regular rhythm.  Pulmonary:     Effort: Pulmonary effort is normal. No respiratory distress.     Breath sounds: Rhonchi present.     Comments: Persistent cough present Musculoskeletal:     Cervical back: Neck supple.  Lymphadenopathy:     Cervical: No cervical adenopathy.  Skin:    Findings: No rash.  Neurological:     Mental Status: She is alert and oriented to person, place, and time. Mental status is at baseline.  Psychiatric:        Mood and Affect: Mood normal.        Behavior: Behavior normal.        Thought Content: Thought content normal.        Judgment: Judgment normal.      No results found. No results found. No results found for this or any previous visit (from the past 24 hour(s)).  Assessment/Plan: LASHAE WOLLENBERG is a 71 y.o. female present for OV for  Bronchitis: Rest.  Hydrate.  Smoking cessation strongly advised. Continue Mucinex and OTC cough regimens. Continue Tessalon Perles. Doxycycline twice daily x10 days Prednisone burst 40 mg x 5 days Follow-up in 2 weeks if symptoms have not resolved.  Reviewed expectations re: course of current medical issues. Discussed self-management of symptoms. Outlined signs and symptoms indicating need for more acute intervention. Patient verbalized understanding and all questions were answered. Patient received an After-Visit Summary.    No orders of the defined types were placed in this encounter.  Meds ordered this encounter  Medications   doxycycline (VIBRA-TABS) 100 MG tablet    Sig: Take 1 tablet (100 mg total) by mouth 2 (two) times daily.    Dispense:  20 tablet    Refill:  0   DISCONTD: predniSONE (DELTASONE) 20 MG tablet    Sig: Take 2 tablets (40 mg total) by mouth daily for 10 days.    Dispense:  10 tablet    Refill:  0   predniSONE (DELTASONE) 20 MG tablet    Sig: Take 2 tablets (40 mg total) by mouth daily for 5 days.    Dispense:  10  tablet    Refill:  0   benzonatate (TESSALON) 200 MG capsule    Sig: Take 1 capsule (200 mg total) by mouth 2 (two) times daily as needed for cough.    Dispense:  20 capsule    Refill:  0   Referral Orders  No referral(s) requested today     Note is dictated utilizing voice recognition software. Although note has been proof read prior to signing, occasional typographical errors still can be missed. If any questions arise, please do not hesitate to call for verification.   electronically signed by:  Howard Pouch, DO  [place]

## 2022-03-08 NOTE — Patient Instructions (Signed)

## 2022-03-25 DIAGNOSIS — Z6827 Body mass index (BMI) 27.0-27.9, adult: Secondary | ICD-10-CM | POA: Diagnosis not present

## 2022-03-25 DIAGNOSIS — M5416 Radiculopathy, lumbar region: Secondary | ICD-10-CM | POA: Diagnosis not present

## 2022-03-25 DIAGNOSIS — S92901A Unspecified fracture of right foot, initial encounter for closed fracture: Secondary | ICD-10-CM | POA: Diagnosis not present

## 2022-03-31 DIAGNOSIS — S92902A Unspecified fracture of left foot, initial encounter for closed fracture: Secondary | ICD-10-CM | POA: Diagnosis not present

## 2022-04-02 DIAGNOSIS — M79672 Pain in left foot: Secondary | ICD-10-CM | POA: Diagnosis not present

## 2022-04-12 DIAGNOSIS — S92352A Displaced fracture of fifth metatarsal bone, left foot, initial encounter for closed fracture: Secondary | ICD-10-CM | POA: Insufficient documentation

## 2022-04-12 DIAGNOSIS — S92355A Nondisplaced fracture of fifth metatarsal bone, left foot, initial encounter for closed fracture: Secondary | ICD-10-CM | POA: Diagnosis not present

## 2022-04-12 HISTORY — DX: Displaced fracture of fifth metatarsal bone, left foot, initial encounter for closed fracture: S92.352A

## 2022-05-01 DIAGNOSIS — M5416 Radiculopathy, lumbar region: Secondary | ICD-10-CM | POA: Diagnosis not present

## 2022-05-06 ENCOUNTER — Telehealth: Payer: Self-pay | Admitting: Family Medicine

## 2022-05-06 ENCOUNTER — Other Ambulatory Visit: Payer: Self-pay

## 2022-05-06 NOTE — Telephone Encounter (Signed)
Pt called and still waiting on approval for Amlodipine and has also requested Hydrochlorothiazide be called in as well at least a few because she has not been taking this due to misplacing the bottle. She is aware insurance may not pay for it, and she is willing to pay out of pocket for it. Please contact patient when blood pressure medication is sent.

## 2022-05-07 NOTE — Telephone Encounter (Signed)
Patient returning call regarding refill.  Patient aware medication is processing at pharmacy.

## 2022-05-07 NOTE — Telephone Encounter (Signed)
LM for pt to return call to discuss.  Pharmacy is filling medication that is already there

## 2022-05-08 DIAGNOSIS — R350 Frequency of micturition: Secondary | ICD-10-CM | POA: Diagnosis not present

## 2022-05-08 DIAGNOSIS — N3946 Mixed incontinence: Secondary | ICD-10-CM | POA: Diagnosis not present

## 2022-05-14 ENCOUNTER — Other Ambulatory Visit: Payer: Self-pay

## 2022-05-17 DIAGNOSIS — S92355A Nondisplaced fracture of fifth metatarsal bone, left foot, initial encounter for closed fracture: Secondary | ICD-10-CM | POA: Diagnosis not present

## 2022-05-21 DIAGNOSIS — N3946 Mixed incontinence: Secondary | ICD-10-CM | POA: Diagnosis not present

## 2022-06-03 DIAGNOSIS — R300 Dysuria: Secondary | ICD-10-CM | POA: Diagnosis not present

## 2022-07-01 ENCOUNTER — Ambulatory Visit (INDEPENDENT_AMBULATORY_CARE_PROVIDER_SITE_OTHER): Payer: Medicare Other | Admitting: Family Medicine

## 2022-07-01 ENCOUNTER — Encounter: Payer: Self-pay | Admitting: Family Medicine

## 2022-07-01 VITALS — BP 124/81 | HR 94 | Temp 98.4°F | Ht 64.0 in | Wt 151.0 lb

## 2022-07-01 DIAGNOSIS — I10 Essential (primary) hypertension: Secondary | ICD-10-CM

## 2022-07-01 DIAGNOSIS — E782 Mixed hyperlipidemia: Secondary | ICD-10-CM

## 2022-07-01 DIAGNOSIS — Z8673 Personal history of transient ischemic attack (TIA), and cerebral infarction without residual deficits: Secondary | ICD-10-CM | POA: Diagnosis not present

## 2022-07-01 DIAGNOSIS — R051 Acute cough: Secondary | ICD-10-CM

## 2022-07-01 LAB — POCT INFLUENZA A/B
Influenza A, POC: NEGATIVE
Influenza B, POC: NEGATIVE

## 2022-07-01 LAB — POC COVID19 BINAXNOW: SARS Coronavirus 2 Ag: POSITIVE — AB

## 2022-07-01 MED ORDER — LOSARTAN POTASSIUM 100 MG PO TABS: 100.0000 mg | ORAL_TABLET | Freq: Every day | ORAL | 1 refills | Status: DC

## 2022-07-01 MED ORDER — AZITHROMYCIN 250 MG PO TABS: ORAL_TABLET | ORAL | 0 refills | Status: AC

## 2022-07-01 MED ORDER — AMLODIPINE BESYLATE 2.5 MG PO TABS
2.5000 mg | ORAL_TABLET | Freq: Every day | ORAL | 1 refills | Status: DC
Start: 2022-07-01 — End: 2022-09-17

## 2022-07-01 MED ORDER — HYDROCHLOROTHIAZIDE 50 MG PO TABS: ORAL_TABLET | ORAL | 1 refills | Status: DC

## 2022-07-01 MED ORDER — MOLNUPIRAVIR EUA 200MG CAPSULE: 4.0000 | ORAL_CAPSULE | Freq: Two times a day (BID) | ORAL | 0 refills | Status: AC

## 2022-07-01 NOTE — Progress Notes (Signed)
/   [name] , 04/02/1951, 71 y.o., female MRN: [id number] Patient Care Team    Relationship Specialty Notifications Start End  [name], DO PCP - General Family Medicine  01/04/16   [name], DPM Consulting Physician Podiatry  01/12/16   Ashok Pall, MD Consulting Physician Neurosurgery  10/03/21     Chief Complaint  Patient presents with   Hypertension    [facility]; pt is fasting    Subjective: Pt presents for routine OV follow up on Chronic Conditions/illness Management Hypertension/morbid obesity/hyperlipidemia: Pt reports compliance  with Cozaar 100  QD, amlodipine 2.5 mg (added last visit) and HCTZ 50 qd. Patient denies chest pain, shortness of breath, dizziness or lower extremity edema.  Exercise: routine exercise.  RF: Hypertension, history of TIA, smoker, family history of heart disease  Cough: Pt reports she travel back from vacation Saturday and noticed resp. System of cough, congestion and rhinorrhea. She reports she overall has felt ill last few days.      07/01/2022   10:33 AM 09/24/2021   10:12 AM 04/09/2021    9:41 AM 04/12/2020    8:22 AM 02/25/2020   10:34 AM  Depression screen PHQ 2/9  Decreased Interest 0 0 0 0 0  Down, Depressed, Hopeless 0 0 0 0 0  PHQ - 2 Score 0 0 0 0 0      01/08/2017    3:43 PM  GAD 7 : Generalized Anxiety Score  Nervous, Anxious, on Edge 2  Control/stop worrying 2  Worry too much - different things 0  Trouble relaxing 0  Restless 0  Easily annoyed or irritable 0  Afraid - awful might happen 0  Total GAD 7 Score 4  Anxiety Difficulty Very difficult    Allergies  Allergen Reactions   Sulfa Antibiotics    Social History   Tobacco Use   Smoking status: Every Day    Packs/day: 1.00    Years: 3.00    Total pack years: 3.00    Types: Cigarettes   Smokeless tobacco: Never  Substance Use Topics   Alcohol use: No    Alcohol/week: 0.0 standard drinks of alcohol   Past Medical History:  Diagnosis Date    Allergic rhinitis    Astigmatism    right eye   Back pain    Dr. Karin Lieu ([facility])   Basal cell carcinoma    Closed fracture of fifth metatarsal bone of left foot 04/12/2022   Combined form of age-related cataract, both eyes    Combined forms of age-related cataract of right eye 06/03/2019   Cystocele    Demyelinating disease ([place]) 2003   No definitive workup completed; prior records   Depression    Disorder of refraction    both eyes   Early stage nonexudative age-related macular degeneration of both eyes    Exophoria    Fibrocystic breast    GERD (gastroesophageal reflux disease)    History of echocardiogram 12/2012   Dr. Daiva Huge: EF55-60%, mild TR, trace pulmonic and MR, abnl left vent diastolic   Hypertension    Lumbar pain 09/24/2021   Lumbar radiculopathy 09/24/2021   Nonexudative age-related macular degeneration, bilateral, early dry stage    Piriformis syndrome 05/2013   Posterior vitreous detachment, both eyes    Postmenopausal    Pseudophakia of left eye    Psoriasis 2009   Regular astigmatism, right eye 06/03/2019   S/P lumbar discectomy 09/24/2021   Sciatica of left side 05/2013  TIA (transient ischemic attack) 2000   Urinary incontinence    Past Surgical History:  Procedure Laterality Date   CHOLECYSTECTOMY  11/2001   LUMBAR DISC SURGERY  04/2005   R L4-5  and L5-S1   SKIN BIOPSY Left 1999   left hand bcc vs scc   TONSILLECTOMY AND ADENOIDECTOMY     Family History  Problem Relation Age of Onset   Rheum arthritis Mother    Alcohol abuse Mother    Heart disease Father 63       MI at 44; died   Diabetes Father    Dementia Maternal Grandmother    Allergies as of 07/01/2022       Reactions   Sulfa Antibiotics         Medication List        Accurate as of July 01, 2022 11:34 AM. If you have any questions, ask your nurse or doctor.          STOP taking these medications    benzonatate 200 MG capsule Commonly known as:  TESSALON Stopped by: Howard Pouch, DO   Diclofenac Sodium CR 100 MG 24 hr tablet Stopped by: Howard Pouch, DO   doxycycline 100 MG tablet Commonly known as: VIBRA-TABS Stopped by: Howard Pouch, DO   pregabalin 150 MG capsule Commonly known as: Lyrica Stopped by: Howard Pouch, DO   tiZANidine 4 MG tablet Commonly known as: Zanaflex Stopped by: Howard Pouch, DO   tolterodine 4 MG 24 hr capsule Commonly known as: DETROL LA Stopped by: Howard Pouch, DO       TAKE these medications    amLODipine 2.5 MG tablet Commonly known as: NORVASC Take 1 tablet (2.5 mg total) by mouth daily.   aspirin EC 325 MG tablet Take 325 mg by mouth daily.   azithromycin 250 MG tablet Commonly known as: ZITHROMAX Take 2 tablets on day 1, then 1 tablet daily on days 2 through 5 Started by: Howard Pouch, DO   hydrochlorothiazide 50 MG tablet Commonly known as: HYDRODIURIL TAKE 1 TABLET(50 MG) BY MOUTH DAILY   losartan 100 MG tablet Commonly known as: COZAAR Take 1 tablet (100 mg total) by mouth daily.   molnupiravir EUA 200 mg Caps capsule Commonly known as: LAGEVRIO Take 4 capsules (800 mg total) by mouth 2 (two) times daily for 5 days. Started by: Howard Pouch, DO   MULTIVITAMIN ADULT PO Take 1 Dose by mouth.        Results for orders placed or performed in visit on 07/01/22 (from the past 24 hour(s))  POC COVID-19 BinaxNow     Status: Abnormal   Collection Time: 07/01/22 10:50 AM  Result Value Ref Range   SARS Coronavirus 2 Ag Positive (A) Negative  POCT Influenza A/B     Status: Normal   Collection Time: 07/01/22 11:32 AM  Result Value Ref Range   Influenza A, POC Negative Negative   Influenza B, POC Negative Negative    No results found.   ROS: Negative, with the exception of above mentioned in HPI Review of Systems  Constitutional:  Positive for malaise/fatigue. Negative for chills and fever.  HENT:  Positive for congestion. Negative for sore throat.   Eyes:   Negative for pain, discharge and redness.  Respiratory:  Positive for cough. Negative for sputum production, shortness of breath and wheezing.   Gastrointestinal:  Negative for diarrhea and nausea.  Musculoskeletal:  Negative for myalgias.  Skin:  Negative for rash.  Neurological:  Positive for  headaches. Negative for dizziness.    Objective:  BP 124/81   Pulse 94   Temp 98.4 F (36.9 C) (Oral)   Ht '5\' 4"'$  (1.626 m)   Wt 151 lb (68.5 kg)   SpO2 98%   BMI 25.92 kg/m  Body mass index is 25.92 kg/m. Physical Exam Vitals and nursing note reviewed.  Constitutional:      General: She is not in acute distress.    Appearance: Normal appearance. She is not ill-appearing, toxic-appearing or diaphoretic.  HENT:     Head: Normocephalic and atraumatic.     Nose: Congestion and rhinorrhea present.  Eyes:     General: No scleral icterus.       Right eye: No discharge.        Left eye: No discharge.     Extraocular Movements: Extraocular movements intact.     Conjunctiva/sclera: Conjunctivae normal.     Pupils: Pupils are equal, round, and reactive to light.  Cardiovascular:     Rate and Rhythm: Normal rate and regular rhythm.     Heart sounds: No murmur heard. Pulmonary:     Effort: Pulmonary effort is normal. No respiratory distress.     Breath sounds: Normal breath sounds. No wheezing, rhonchi or rales.  Musculoskeletal:     Cervical back: Neck supple. No tenderness.     Right lower leg: No edema.     Left lower leg: No edema.  Lymphadenopathy:     Cervical: No cervical adenopathy.  Skin:    General: Skin is warm and dry.     Coloration: Skin is not jaundiced or pale.     Findings: No erythema or rash.  Neurological:     Mental Status: She is alert and oriented to person, place, and time. Mental status is at baseline.     Motor: No weakness.     Gait: Gait normal.  Psychiatric:        Mood and Affect: Mood normal.        Behavior: Behavior normal.        Thought Content:  Thought content normal.        Judgment: Judgment normal.     Results for orders placed or performed in visit on 07/01/22 (from the past 24 hour(s))  POC COVID-19 BinaxNow     Status: Abnormal   Collection Time: 07/01/22 10:50 AM  Result Value Ref Range   SARS Coronavirus 2 Ag Positive (A) Negative  POCT Influenza A/B     Status: Normal   Collection Time: 07/01/22 11:32 AM  Result Value Ref Range   Influenza A, POC Negative Negative   Influenza B, POC Negative Negative     Assessment/Plan: [name] is a 71 y.o. female present for OV for Chronic Conditions/illness Management Essential hypertension, benign/morbid obesity/hyperlipidemia/history of TIA/long-term current med use Stable Continue amlodipine 2.5 mg qd Continue losartan 100  Continue HCTZ 50 mg daily. - continue ASA 325  - low sodium, exercise.  - UTD - Follow-up 5.5 months   Cough/covid 19: Rest, hydrate.  mucinex (DM if cough), nettie pot or nasal saline.  Molnupiravir and azith  prescribed, take until completed.  Reviewed home care instructions for COVID. Advised self-isolation at home for at least 5 days. After 5 days, if improved and fever resolved, can be in public, but should [place] a mask around others for an additional 5 days. If symptoms, esp, dyspnea develops/worsens, recommend in-person evaluation at either an urgent care or the emergency room.  Return in about 24 weeks (around 12/16/2022) for Routine chronic condition follow-up.  Orders Placed This Encounter  Procedures   POCT Influenza A/B   POC COVID-19 BinaxNow    Meds ordered this encounter  Medications   amLODipine (NORVASC) 2.5 MG tablet    Sig: Take 1 tablet (2.5 mg total) by mouth daily.    Dispense:  90 tablet    Refill:  1   hydrochlorothiazide (HYDRODIURIL) 50 MG tablet    Sig: TAKE 1 TABLET(50 MG) BY MOUTH DAILY    Dispense:  90 tablet    Refill:  1   losartan (COZAAR) 100 MG tablet    Sig: Take 1 tablet (100 mg total) by  mouth daily.    Dispense:  90 tablet    Refill:  1   molnupiravir EUA (LAGEVRIO) 200 mg CAPS capsule    Sig: Take 4 capsules (800 mg total) by mouth 2 (two) times daily for 5 days.    Dispense:  40 capsule    Refill:  0   azithromycin (ZITHROMAX) 250 MG tablet    Sig: Take 2 tablets on day 1, then 1 tablet daily on days 2 through 5    Dispense:  6 tablet    Refill:  0       electronically signed by:  Howard Pouch, DO  [place]

## 2022-07-01 NOTE — Patient Instructions (Addendum)
Return in about 24 weeks (around 12/16/2022) for Routine chronic condition follow-up.        Great to see you today.  I have refilled the medication(s) we provide.   If labs were collected, we will inform you of lab results once received either by echart message or telephone call.   - echart message- for normal results that have been seen by the patient already.   - telephone call: abnormal results or if patient has not viewed results in their echart.   COVID-19 COVID-19, or coronavirus disease 2019, is an infection that is caused by a new (novel) coronavirus called SARS-CoV-2. COVID-19 can cause many symptoms. In some people, the virus may not cause any symptoms. In others, it may cause mild or severe symptoms. Some people with severe infection develop severe disease. What are the causes? This illness is caused by a virus. The virus may be in the air as tiny specks of fluid (aerosols) or droplets, or it may be on surfaces. You may catch the virus by: Breathing in droplets from an infected person. Droplets can be spread by a person breathing, speaking, singing, coughing, or sneezing. Touching something, like a table or a doorknob, that has virus on it (is contaminated) and then touching your mouth, nose, or eyes. What increases the risk? Risk for infection: You are more likely to get infected with the COVID-19 virus if: You are within 6 ft (1.8 m) of a person with COVID-19 for 15 minutes or longer. You are providing care for a person who is infected with COVID-19. You are in close personal contact with other people. Close personal contact includes hugging, kissing, or sharing eating or drinking utensils. Risk for serious illness caused by COVID-19: You are more likely to get seriously ill from the COVID-19 virus if: You have cancer. You have a long-term (chronic) disease, such as: Chronic lung disease. This includes pulmonary embolism, chronic obstructive pulmonary disease, and cystic  fibrosis. Long-term disease that lowers your body's ability to fight infection (immunocompromise). Serious cardiac conditions, such as heart failure, coronary artery disease, or cardiomyopathy. Diabetes. Chronic kidney disease. Liver diseases. These include cirrhosis, nonalcoholic fatty liver disease, alcoholic liver disease, or autoimmune hepatitis. You have obesity. You are pregnant or were recently pregnant. You have sickle cell disease. What are the signs or symptoms? Symptoms of this condition can range from mild to severe. Symptoms may appear any time from 2 to 14 days after being exposed to the virus. They include: Fever or chills. Shortness of breath or trouble breathing. Feeling tired or very tired. Headaches, body aches, or muscle aches. Runny or stuffy nose, sneezing, coughing, or sore throat. New loss of taste or smell. This is rare. Some people may also have stomach problems, such as nausea, vomiting, or diarrhea. Other people may not have any symptoms of COVID-19. How is this diagnosed? This condition may be diagnosed by testing samples to check for the COVID-19 virus. The most common tests are the PCR test and the antigen test. Tests may be done in the lab or at home. They include: Using a swab to take a sample of fluid from the back of your nose and throat (nasopharyngeal fluid), from your nose, or from your throat. Testing a sample of saliva from your mouth. Testing a sample of coughed-up mucus from your lungs (sputum). How is this treated? Treatment for COVID-19 infection depends on the severity of the condition. Mild symptoms can be managed at home with rest, fluids, and over-the-counter  medicines. Serious symptoms may be treated in a hospital intensive care unit (ICU). Treatment in the ICU may include: Supplemental oxygen. Extra oxygen is given through a tube in the nose, a face mask, or a hood. Medicines. These may include: Antivirals, such as monoclonal antibodies.  These help your body fight off certain viruses that can cause disease. Anti-inflammatories, such as corticosteroids. These reduce inflammation and suppress the immune system. Antithrombotics. These prevent or treat blood clots, if they develop. Convalescent plasma. This helps boost your immune system, if you have an underlying immunosuppressive condition or are getting immunosuppressive treatments. Prone positioning. This means you will lie on your stomach. This helps oxygen to get into your lungs. Infection control measures. If you are at risk for more serious illness caused by COVID-19, your health care provider may prescribe two long-acting monoclonal antibodies, given together every 6 months. How is this prevented? To protect yourself: Use preventive medicine (pre-exposure prophylaxis). You may get pre-exposure prophylaxis if you have moderate or severe immunocompromise. Get vaccinated. Anyone 73 months old or older who meets guidelines can get a COVID-19 vaccine or vaccine series. This includes people who are pregnant or making breast milk (lactating). Get an added dose of COVID-19 vaccine after your first vaccine or vaccine series if you have moderate to severe immunocompromise. This applies if you have had a solid organ transplant or have been diagnosed with an immunocompromising condition. You should get the added dose 4 weeks after you got the first COVID-19 vaccine or vaccine series. If you get an mRNA vaccine, you will need a 3-dose primary series. If you get the J&J/Janssen vaccine, you will need a 2-dose primary series, with the second dose being an mRNA vaccine. Talk to your health care provider about getting experimental monoclonal antibodies. This treatment is approved under emergency use authorization to prevent severe illness before or after being exposed to the COVID-19 virus. You may be given monoclonal antibodies if: You have moderate or severe immunocompromise. This includes  treatments that lower your immune response. People with immunocompromise may not develop protection against COVID-19 when they are vaccinated. You cannot be vaccinated. You may not get a vaccine if you have a severe allergic reaction to the vaccine or its components. You are not fully vaccinated. You are in a facility where COVID-19 is present and: Are in close contact with a person who is infected with the COVID-19 virus. Are at high risk of being exposed to the COVID-19 virus. You are at risk of illness from new variants of the COVID-19 virus. To protect others: If you have symptoms of COVID-19, take steps to prevent the virus from spreading to others. Stay home. Leave your house only to get medical care. Do not use public transit, if possible. Do not travel while you are sick. Wash your hands often with soap and water for at least 20 seconds. If soap and water are not available, use alcohol-based hand sanitizer. Make sure that all people in your household wash their hands well and often. Cough or sneeze into a tissue or your sleeve or elbow. Do not cough or sneeze into your hand or into the air. Where to find more information Centers for Disease Control and Prevention: CharmCourses.be World Health Organization: https://www.castaneda.info/ Get help right away if: You have trouble breathing. You have pain or pressure in your chest. You are confused. You have bluish lips and fingernails. You have trouble waking from sleep. You have symptoms that get worse. These symptoms may be an  emergency. Get help right away. Call 911. Do not wait to see if the symptoms will go away. Do not drive yourself to the hospital. Summary COVID-19 is an infection that is caused by a new coronavirus. Sometimes, there are no symptoms. Other times, symptoms range from mild to severe. Some people with a severe COVID-19 infection develop severe disease. The virus that causes COVID-19 can spread  from person to person through droplets or aerosols from breathing, speaking, singing, coughing, or sneezing. Mild symptoms of COVID-19 can be managed at home with rest, fluids, and over-the-counter medicines. This information is not intended to replace advice given to you by your health care provider. Make sure you discuss any questions you have with your health care provider. Document Revised: 06/19/2021 Document Reviewed: 06/21/2021 Elsevier Patient Education  [place].

## 2022-07-17 DIAGNOSIS — R350 Frequency of micturition: Secondary | ICD-10-CM | POA: Diagnosis not present

## 2022-07-17 DIAGNOSIS — N3946 Mixed incontinence: Secondary | ICD-10-CM | POA: Diagnosis not present

## 2022-07-19 DIAGNOSIS — S92355A Nondisplaced fracture of fifth metatarsal bone, left foot, initial encounter for closed fracture: Secondary | ICD-10-CM | POA: Diagnosis not present

## 2022-08-08 ENCOUNTER — Ambulatory Visit (INDEPENDENT_AMBULATORY_CARE_PROVIDER_SITE_OTHER): Payer: Medicare Other

## 2022-08-08 DIAGNOSIS — F17210 Nicotine dependence, cigarettes, uncomplicated: Secondary | ICD-10-CM

## 2022-08-08 DIAGNOSIS — Z87891 Personal history of nicotine dependence: Secondary | ICD-10-CM

## 2022-08-30 ENCOUNTER — Telehealth: Payer: Self-pay | Admitting: Acute Care

## 2022-08-30 DIAGNOSIS — Z87891 Personal history of nicotine dependence: Secondary | ICD-10-CM

## 2022-08-30 DIAGNOSIS — Z122 Encounter for screening for malignant neoplasm of respiratory organs: Secondary | ICD-10-CM

## 2022-08-30 DIAGNOSIS — F17210 Nicotine dependence, cigarettes, uncomplicated: Secondary | ICD-10-CM

## 2022-08-30 NOTE — Telephone Encounter (Signed)
I have called the patient with the results of her low dose Ct Chest. I explained that her scan was read as a LR 2 , 12 month follow up in 07/2023.  I also explained that there was notation of CAD and aortic atherosclerosis. We discussed treatment for CAD is often statin medications.  She is not on statins and she does not want to be on statins.  We also discussed the recommendation for a 2 D echo. I have asked her to discuss this with her PCP.  Dr. Raoul Pitch, please review the CT results and order echo if you feel it ios clinically indicated.   Denise, 12 month follow up LDCT. Thanks so much

## 2022-09-02 NOTE — Telephone Encounter (Signed)
Order placed for yearly lung screening CT.

## 2022-09-05 ENCOUNTER — Ambulatory Visit (INDEPENDENT_AMBULATORY_CARE_PROVIDER_SITE_OTHER): Payer: Medicare Other | Admitting: Family Medicine

## 2022-09-05 ENCOUNTER — Encounter: Payer: Self-pay | Admitting: Family Medicine

## 2022-09-05 VITALS — BP 128/77 | HR 89 | Temp 98.1°F | Ht 64.0 in | Wt 148.6 lb

## 2022-09-05 DIAGNOSIS — I499 Cardiac arrhythmia, unspecified: Secondary | ICD-10-CM

## 2022-09-05 DIAGNOSIS — H26491 Other secondary cataract, right eye: Secondary | ICD-10-CM | POA: Diagnosis not present

## 2022-09-05 DIAGNOSIS — M629 Disorder of muscle, unspecified: Secondary | ICD-10-CM | POA: Diagnosis not present

## 2022-09-05 DIAGNOSIS — R0789 Other chest pain: Secondary | ICD-10-CM

## 2022-09-05 DIAGNOSIS — M542 Cervicalgia: Secondary | ICD-10-CM | POA: Diagnosis not present

## 2022-09-05 DIAGNOSIS — I4892 Unspecified atrial flutter: Secondary | ICD-10-CM

## 2022-09-05 MED ORDER — METOPROLOL SUCCINATE ER 25 MG PO TB24: 25.0000 mg | ORAL_TABLET | Freq: Every day | ORAL | 0 refills | Status: DC

## 2022-09-05 MED ORDER — APIXABAN (ELIQUIS) VTE STARTER PACK (10MG AND 5MG): ORAL_TABLET | ORAL | 0 refills | Status: DC

## 2022-09-05 NOTE — Patient Instructions (Signed)
Stop taking aspirin.  Do not take any ibuprofen or aleve, either. You can take tylenol as needed for pain.  Split your losartan tab in half daily. Remain off amlodipine. OK to continue hctz 67m (whole tab) daily.

## 2022-09-05 NOTE — Progress Notes (Signed)
OFFICE VISIT  09/05/2022  CC:  Chief Complaint  Patient presents with   Neck Gland Concern    neck glands hurt all the way down her back; hurts when she takes a deep breath and discomfort all around ribcage. Returned from cruise within the last 2-3 weeks.     Patient is a 71 y.o. female who presents for neck gland concern.  HPI: 2-day history of sore and this in the anterior neck and anterior chest diffusely when she takes a deep breath.  No wheezing, cough, fever, malaise, palpitations, chest tightness, or chest pressure. She took ibuprofen last night and it did help some.  Feels significantly better today.  No recent URI/sore throat/dental problems.  No history of arrhythmia, no known coronary artery disease.  Covid infection 07/01/22.  Past Medical History:  Diagnosis Date   Allergic rhinitis    Astigmatism    right eye   Back pain    Dr. 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 Daiva Huge: EF55-60%, mild TR, trace pulmonic and MR, abnl left vent diastolic   Hypertension    Lumbar pain 09/24/2021   Lumbar radiculopathy 09/24/2021   Nonexudative age-related macular degeneration, bilateral, early dry stage    Piriformis syndrome 05/2013   Posterior vitreous detachment, both eyes    Postmenopausal    Pseudophakia of left eye    Psoriasis 2009   Regular astigmatism, right eye 06/03/2019   S/P lumbar discectomy 09/24/2021   Sciatica of  left side 05/2013   TIA (transient ischemic attack) 2000   Urinary incontinence     Past Surgical History:  Procedure Laterality Date   CHOLECYSTECTOMY  11/2001   LUMBAR DISC SURGERY  04/2005   R L4-5  and L5-S1   SKIN BIOPSY Left 1999   left hand bcc vs scc   TONSILLECTOMY AND ADENOIDECTOMY      Outpatient Medications Prior to Visit  Medication Sig Dispense Refill   hydrochlorothiazide (HYDRODIURIL) 50 MG tablet TAKE 1 TABLET(50 MG) BY MOUTH DAILY 90 tablet 1   losartan (COZAAR) 100 MG tablet Take 1 tablet (100 mg total) by mouth daily. 90 tablet 1   Multiple Vitamins-Minerals (MULTIVITAMIN ADULT PO) Take 1 Dose by mouth.     aspirin EC 325 MG tablet Take 325 mg by mouth daily.     amLODipine (NORVASC) 2.5 MG tablet Take 1 tablet (2.5 mg total) by mouth daily. (Patient not taking: Reported on 09/05/2022) 90 tablet 1   No facility-administered medications prior to visit.    Allergies  Allergen Reactions   Sulfa Antibiotics     Review of Systems  As per HPI  PE:    09/05/2022   10:05 AM 07/01/2022   10:37 AM 07/01/2022   10:32 AM  Vitals with BMI  Height 5' 4"$   5' 4"$   Weight 148 lbs 10 oz  151 lbs  BMI 25.49  25.91  Systolic 0000000 [id number]  Diastolic 77 81 79  Pulse 89  94   Exam chaperoned by Deveron Furlong, CMA.  Physical Exam  Gen: Alert, well appearing.  Patient is oriented to person, place, time, and situation. AFFECT: pleasant, lucid thought and speech. ENT: Ears: EACs clear, normal epithelium.  TMs with good light reflex and landmarks bilaterally.  Eyes: no injection, icteris, swelling, or exudate.  EOMI, PERRLA. Nose: no drainage or turbinate edema/swelling.  No injection or focal lesion.  Mouth: lips without lesion/swelling.  Oral mucosa pink and moist.  Dentition intact and without obvious caries or gingival swelling.  Oropharynx without erythema, exudate, or swelling.  NECK: soft, approx 2 cm, rubbery and mildly tender LN in L submandib region and similar  in R jugulodigastric region.   CV: irreg irreg, rate about 100, no m/r Chest is clear, no wheezing or rales. Normal symmetric air entry throughout both lung fields. No chest wall deformities or tenderness. EXT: no clubbing or cyanosis.  no edema.    LABS:  Last CBC Lab Results  Component Value Date   WBC 11.0 (H) 01/23/2022   HGB 13.9 01/23/2022   HCT 39.1 01/23/2022   MCV 93.0 01/23/2022   RDW 13.2 01/23/2022   PLT 159.0 [id number]   Last metabolic panel Lab Results  Component Value Date   GLUCOSE 109 (H) 01/23/2022   NA 131 (L) 01/23/2022   K 3.9 01/23/2022   CL 97 01/23/2022   CO2 27 01/23/2022   BUN 27 (H) 01/23/2022   CREATININE 0.64 01/23/2022   GFRNONAA >89 02/06/2017   CALCIUM 9.4 01/23/2022   PROT 6.4 01/23/2022   ALBUMIN 4.2 01/23/2022   BILITOT 0.7 01/23/2022   ALKPHOS 59 01/23/2022   AST 16 01/23/2022   ALT 13 01/23/2022   Last hemoglobin A1c Lab Results  Component Value Date   HGBA1C 5.2 01/23/2022   Last thyroid functions Lab Results  Component Value Date   TSH 1.48 01/23/2022   12 lead EKG today: Atrial flutter, rate 109, no ischemic changes.   (No prior for comparison)  IMPRESSION AND PLAN:  #1 new diagnosis atrial flutter, mild tachycardia. CHADVasc score is 4. Start Eliquis, 10 twice daily x 7 days and then 5 twice daily. Start Toprol-XL 25 mg a day. Check cbc, bmet, tsh, mag. Refer to A-fib clinic.  #2 anterior neck and chest discomfort with deep inspiration. Possibly associated with #1 above. Significantly improved today. Reassured.  An After Visit Summary was printed and given to the patient.  FOLLOW UP: Return in about 1 week (around 09/12/2022) for f/u a-flutter with Dr. Raoul Pitch or me if she is not available.  Signed:  Crissie Sickles, MD           09/05/2022

## 2022-09-06 ENCOUNTER — Other Ambulatory Visit (INDEPENDENT_AMBULATORY_CARE_PROVIDER_SITE_OTHER): Payer: Medicare Other

## 2022-09-06 DIAGNOSIS — I4892 Unspecified atrial flutter: Secondary | ICD-10-CM

## 2022-09-06 DIAGNOSIS — M629 Disorder of muscle, unspecified: Secondary | ICD-10-CM

## 2022-09-06 LAB — BASIC METABOLIC PANEL
BUN: 22 mg/dL (ref 6–23)
CO2: 28 mEq/L (ref 19–32)
Calcium: 10 mg/dL (ref 8.4–10.5)
Chloride: 99 mEq/L (ref 96–112)
Creatinine, Ser: 0.55 mg/dL (ref 0.40–1.20)
GFR: 91.1 mL/min (ref >60.00)
Glucose, Bld: 78 mg/dL (ref 70–99)
Potassium: 3.6 mEq/L (ref 3.5–5.1)
Sodium: 138 mEq/L (ref 135–145)

## 2022-09-06 LAB — CBC
HCT: 43.7 % (ref 36.0–46.0)
Hemoglobin: 15.4 g/dL — ABNORMAL HIGH (ref 12.0–15.0)
MCHC: 35.2 g/dL (ref 30.0–36.0)
MCV: 93.6 fl (ref 78.0–100.0)
Platelets: 178 10*3/uL (ref 150.0–400.0)
RBC: 4.67 Mil/uL (ref 3.87–5.11)
RDW: 13.4 % (ref 11.5–15.5)
WBC: 10.7 10*3/uL — ABNORMAL HIGH (ref 4.0–10.5)

## 2022-09-06 LAB — MAGNESIUM: Magnesium: 1.6 mg/dL (ref 1.5–2.5)

## 2022-09-06 LAB — TSH: TSH: 2.1 u[IU]/mL (ref 0.35–5.50)

## 2022-09-08 DIAGNOSIS — Z1211 Encounter for screening for malignant neoplasm of colon: Secondary | ICD-10-CM | POA: Diagnosis not present

## 2022-09-12 ENCOUNTER — Ambulatory Visit: Payer: Medicare Other | Admitting: Family Medicine

## 2022-09-17 ENCOUNTER — Encounter: Payer: Self-pay | Admitting: Family Medicine

## 2022-09-17 ENCOUNTER — Ambulatory Visit (INDEPENDENT_AMBULATORY_CARE_PROVIDER_SITE_OTHER): Payer: Medicare Other | Admitting: Family Medicine

## 2022-09-17 VITALS — BP 114/75 | HR 81 | Temp 98.1°F | Wt 148.0 lb

## 2022-09-17 DIAGNOSIS — I4892 Unspecified atrial flutter: Secondary | ICD-10-CM | POA: Diagnosis not present

## 2022-09-17 DIAGNOSIS — Z8673 Personal history of transient ischemic attack (TIA), and cerebral infarction without residual deficits: Secondary | ICD-10-CM | POA: Diagnosis not present

## 2022-09-17 DIAGNOSIS — Z5320 Procedure and treatment not carried out because of patient's decision for unspecified reasons: Secondary | ICD-10-CM | POA: Insufficient documentation

## 2022-09-17 MED ORDER — APIXABAN 5 MG PO TABS: 5.0000 mg | ORAL_TABLET | Freq: Two times a day (BID) | ORAL | 5 refills | Status: DC

## 2022-09-17 MED ORDER — METOPROLOL SUCCINATE ER 25 MG PO TB24: 25.0000 mg | ORAL_TABLET | Freq: Every day | ORAL | 1 refills | Status: DC

## 2022-09-17 NOTE — Patient Instructions (Addendum)
Cardiology appt next week to discuss atrial flutter treatment.         Great to see you today.  I have refilled the medication(s) we provide.   If labs were collected, we will inform you of lab results once received either by echart message or telephone call.   - echart message- for normal results that have been seen by the patient already.   - telephone call: abnormal results or if patient has not viewed results in their echart.

## 2022-09-17 NOTE — Progress Notes (Signed)
[name], [name], [name], 72 y.o., female MRN: [id number] Patient Care Team    Relationship Specialty Notifications Start End  [name], DO PCP - General Family Medicine  01/04/16   [name], DPM Consulting Physician Podiatry  01/12/16   Ashok Pall, MD Consulting Physician Neurosurgery  10/03/21     Chief Complaint  Patient presents with   Atrial Flutter     Subjective: [name] is a 72 y.o. Pt presents for follow up on new onset atrial flutter. She was seen last week,by another provider with symptoms of neck and chest discomfort- "did not feel right.". An irregular rhythm was noted on exam and EKG revealed A. Flutter. She was started on metoprolol 25 mg and eliquis. Today she reports she is tolerating medications and symptoms have completely resolved. She has an appt next week with cardio.     09/05/2022   10:22 AM 07/01/2022   10:33 AM 09/24/2021   10:12 AM 04/09/2021    9:41 AM 04/12/2020    8:22 AM  Depression screen PHQ 2/9  Decreased Interest 0 0 0 0 0  Down, Depressed, Hopeless 0 0 0 0 0  PHQ - 2 Score 0 0 0 0 0    Allergies  Allergen Reactions   Sulfa Antibiotics    Social History   Social History Narrative   Widower 2018 (Damon). 2 sons.   College. Retired.    Former smoker (quit 04/2005- 30+ pack year), occasional etoh, no drugs   Drinks caffeine, uses herbal remedies, daily vitamin use   Wears her seatbelt, wears her bicycle helmet   Exercises routinely   Smoke detector in the home.    Firearms in the home (locked)   Feels safe in her relationships.    Past Medical History:  Diagnosis Date   Allergic rhinitis    Astigmatism    right eye   Back pain    Dr. Karin Lieu ([facility])   Basal cell carcinoma    Closed fracture of fifth metatarsal bone of left foot 04/12/2022   Combined form of age-related cataract, both eyes    Combined forms of age-related cataract of right eye 06/03/2019   Cystocele     Demyelinating disease ([place]) 2003   No definitive workup completed; prior records   Depression    Disorder of refraction    both eyes   Early stage nonexudative age-related macular degeneration of both eyes    Exophoria    Fibrocystic breast    GERD (gastroesophageal reflux disease)    History of echocardiogram 12/2012   Dr. Daiva Huge: EF55-60%, mild TR, trace pulmonic and MR, abnl left vent diastolic   Hypertension    Lumbar pain 09/24/2021   Lumbar radiculopathy 09/24/2021   Nonexudative age-related macular degeneration, bilateral, early dry stage    Piriformis syndrome 05/2013   Posterior vitreous detachment, both eyes    Postmenopausal    Pseudophakia of left eye    Psoriasis 2009   Regular astigmatism, right eye 06/03/2019   S/P lumbar discectomy 09/24/2021   Sciatica of left side 05/2013   TIA (transient ischemic attack) 2000   Urinary incontinence    Past Surgical History:  Procedure Laterality Date   CHOLECYSTECTOMY  11/2001   LUMBAR [place] SURGERY  04/2005   R L4-5  and L5-S1   SKIN BIOPSY Left 1999   left hand bcc vs scc   TONSILLECTOMY AND ADENOIDECTOMY     Family History  Problem Relation Age of Onset   Rheum arthritis Mother    Alcohol abuse Mother    Heart disease Father 67       MI at 60; died   Diabetes Father    Dementia Maternal Grandmother    Allergies as of 09/17/2022       Reactions   Sulfa Antibiotics         Medication List        Accurate as of September 17, 2022 10:35 AM. If you have any questions, ask your nurse or doctor.          STOP taking these medications    amLODipine 2.5 MG tablet Commonly known as: NORVASC Stopped by: Howard Pouch, DO   Apixaban Starter Pack ('10mg'$  and '5mg'$ ) Commonly known as: ELIQUIS STARTER PACK Stopped by: Howard Pouch, DO       TAKE these medications    hydrochlorothiazide 50 MG tablet Commonly known as: HYDRODIURIL TAKE 1 TABLET(50 MG) BY MOUTH DAILY   losartan 100 MG tablet Commonly known as:  COZAAR Take 1 tablet (100 mg total) by mouth daily.   metoprolol succinate 25 MG 24 hr tablet Commonly known as: TOPROL-XL Take 1 tablet (25 mg total) by mouth daily.   MULTIVITAMIN ADULT PO Take 1 Dose by mouth.        All past medical history, surgical history, allergies, family history, immunizations andmedications were updated in the EMR today and reviewed under the history and medication portions of their EMR.     ROS Negative, with the exception of above mentioned in HPI   Objective:  BP 114/75   Pulse 81   Temp 98.1 F (36.7 C)   Wt 148 lb (67.1 kg)   SpO2 98%   BMI 25.40 kg/m  Body mass index is 25.4 kg/m. Physical Exam Vitals and nursing note reviewed.  Constitutional:      General: She is not in acute distress.    Appearance: Normal appearance. She is not ill-appearing, toxic-appearing or diaphoretic.  HENT:     Head: Normocephalic and atraumatic.  Eyes:     General: No scleral icterus.       Right eye: No discharge.        Left eye: No discharge.     Extraocular Movements: Extraocular movements intact.     Conjunctiva/sclera: Conjunctivae normal.     Pupils: Pupils are equal, round, and reactive to light.  Cardiovascular:     Rate and Rhythm: Normal rate and regular rhythm.     Heart sounds: No murmur heard. Pulmonary:     Effort: Pulmonary effort is normal. No respiratory distress.     Breath sounds: Normal breath sounds. No wheezing, rhonchi or rales.  Musculoskeletal:     Right lower leg: No edema.     Left lower leg: No edema.  Skin:    General: Skin is warm.     Findings: No rash.  Neurological:     Mental Status: She is alert and oriented to person, place, and time. Mental status is at baseline.     Motor: No weakness.     Gait: Gait normal.  Psychiatric:        Mood and Affect: Mood normal.        Behavior: Behavior normal.        Thought Content: Thought content normal.        Judgment: Judgment normal.   EKG today: HR 75. SR. PR  172, QTC 412.  No results  found. No results found. No results found for this or any previous visit (from the past 24 hour(s)).  Assessment/Plan: [name] is a 72 y.o. female present for OV for  Atrial flutter, unspecified type (HCC)/H/o TIA New onset- noted last week w/ symptoms She has cardio eval next week to discuss options for potential long term treatment options  Continue eliquis 5 mg BID . CHADS-VASc 5 Continue metoprolol 25 mg qd. Tolerating and rpt EKG improved.  Continue losartan and HCTZ for HTN.  Current smoker- strongly encouraged to quit - EKG 12-Lead today : improved. SR.   Reviewed expectations re: course of current medical issues. Discussed self-management of symptoms. Outlined signs and symptoms indicating need for more acute intervention. Patient verbalized understanding and all questions were answered. Patient received an After-Visit Summary.    Orders Placed This Encounter  Procedures   EKG 12-Lead   No orders of the defined types were placed in this encounter.  Referral Orders  No referral(s) requested today     Note is dictated utilizing voice recognition software. Although note has been proof read prior to signing, occasional typographical errors still can be missed. If any questions arise, please do not hesitate to call for verification.   electronically signed by:  Howard Pouch, DO  [place]

## 2022-09-18 LAB — COLOGUARD: COLOGUARD: NEGATIVE

## 2022-09-23 ENCOUNTER — Other Ambulatory Visit: Payer: Self-pay | Admitting: Surgery

## 2022-09-23 DIAGNOSIS — I712 Thoracic aortic aneurysm, without rupture, unspecified: Secondary | ICD-10-CM

## 2022-09-24 NOTE — Progress Notes (Unsigned)
Cardiology Office Note:    Date:  09/25/2022   ID:  [name], DOB 1951-05-10, MRN [id number]  PCP:  [name], DO   [facility] Providers Cardiologist:  None     Referring MD: Tammi Sou, MD   Chief Complaint  Patient presents with   Tachycardia    History of Present Illness:    [name] is a 72 y.o. female is seen at the request of Dr Raoul Pitch for evaluation of possible atrial flutter. She has a history of HTN and remote TIA in 2000. BP was very high at that time. She was seen on 09/05/22 with neck and anterior chest pain. Her chest pain was only when she took a real deep breath and only lasted one night. Ecg showed HR 109 and it was felt this was atrial flutter.  Was started on metoprolol and Eliquis. Seen again on March 5 and was in NSR rate 75. Was referred for further evaluation. Prior Echo in 2014 was essentially normal.   She is a smoker. Has had CT for cancer screening showing severe coronary and aortic calcification. No history of MI. Denies any angina or dyspnea currently. Feels very well.   Past Medical History:  Diagnosis Date   Allergic rhinitis    Astigmatism    right eye   Back pain    Dr. 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 Daiva Huge: EF55-60%, mild TR, trace pulmonic and MR, abnl left vent diastolic   Hypertension    Lumbar pain 09/24/2021   Lumbar radiculopathy 09/24/2021   Nonexudative  age-related macular degeneration, bilateral, early dry stage    Piriformis syndrome 05/2013   Posterior vitreous detachment, both eyes    Postmenopausal    Pseudophakia of left eye    Psoriasis 2009   Regular astigmatism, right eye 06/03/2019   S/P lumbar discectomy 09/24/2021   Sciatica of left side 05/2013   TIA (transient ischemic attack) 2000   Urinary incontinence     Past Surgical History:  Procedure Laterality Date   CHOLECYSTECTOMY  11/2001   LUMBAR DISC SURGERY  04/2005   R L4-5  and L5-S1   SKIN BIOPSY Left 1999   left hand bcc vs scc   TONSILLECTOMY AND ADENOIDECTOMY      Current Medications: Current Meds  Medication Sig   aspirin EC 81 MG tablet Take 1 tablet (81 mg total) by mouth daily. Swallow whole.   hydrochlorothiazide (HYDRODIURIL) 50 MG tablet TAKE 1 TABLET(50 MG) BY MOUTH DAILY   Multiple Vitamins-Minerals (MULTIVITAMIN ADULT PO) Take 1 Dose by mouth.   [DISCONTINUED] apixaban (ELIQUIS) 5 MG TABS tablet Take 1 tablet (5 mg total) by mouth 2 (two) times daily.   [DISCONTINUED] losartan (COZAAR) 100 MG tablet Take 1 tablet (100 mg total) by mouth daily. (Patient taking differently: Take 100 mg by mouth daily. Take 0.5 Tablet (50  MG) by mouth daily.)   [DISCONTINUED] metoprolol succinate (TOPROL-XL) 25 MG 24 hr tablet Take 1 tablet (25 mg total) by mouth daily.     Allergies:   Sulfa antibiotics   Social History   Socioeconomic History   Marital status: Widowed    Spouse name: Not on file   Number of children: Not on file   Years of education: Not on file   Highest education level: Some college, no degree  Occupational History   Not on file  Tobacco Use   Smoking status: Every Day    Packs/day: 1.00    Years: 3.00    Total pack years: 3.00    Types: Cigarettes   Smokeless tobacco: Never  Vaping Use   Vaping Use: Never used  Substance and Sexual Activity   Alcohol use: No    Alcohol/week: 0.0 standard drinks of alcohol   Drug use: No   Sexual  activity: Never  Other Topics Concern   Not on file  Social History Narrative   Widower 2018 (Damon). 2 sons.   College. Retired.    Former smoker (quit 04/2005- 30+ pack year), occasional etoh, no drugs   Drinks caffeine, uses herbal remedies, daily vitamin use   Wears her seatbelt, wears her bicycle helmet   Exercises routinely   Smoke detector in the home.    Firearms in the home (locked)   Feels safe in her relationships.    Social Determinants of Health   Financial Resource Strain: Low Risk  (01/19/2022)   Overall Financial Resource Strain (CARDIA)    Difficulty of Paying Living Expenses: Not hard at all  Food Insecurity: No Food Insecurity (01/19/2022)   Hunger Vital Sign    Worried About Running Out of Food in the Last Year: Never true    Ran Out of Food in the Last Year: Never true  Transportation Needs: No Transportation Needs (01/19/2022)   PRAPARE - Hydrologist (Medical): No    Lack of Transportation (Non-Medical): No  Physical Activity: Unknown (01/19/2022)   Exercise Vital Sign    Days of Exercise per Week: 0 days    Minutes of Exercise per Session: Not on file  Stress: No Stress Concern Present (01/19/2022)   [place]    Feeling of Stress : Not at all  Social Connections: Unknown (01/19/2022)   Social Connection and Isolation Panel [NHANES]    Frequency of Communication with Friends and Family: More than three times a week    Frequency of Social Gatherings with Friends and Family: More than three times a week    Attends Religious Services: Patient refused    Active Member of Clubs or Organizations: Yes    Attends Archivist Meetings: Never    Marital Status: Widowed     Family History: The patient's family history includes Alcohol abuse in her mother; Dementia in her maternal grandmother; Diabetes in her father; Heart disease (age of onset: 29) in her father; Rheum  arthritis in her mother.  ROS:   Please see the history of present illness.     All other systems reviewed and are negative.  EKGs/Labs/Other Studies Reviewed:    The following studies were reviewed today: Echo in 2014 as noted  EKG:  EKG is  ordered today.  The ekg ordered today demonstrates NSR rate 84. Otherwise normal. I have personally reviewed and interpreted this study.   Recent Labs: 01/23/2022: ALT 13  09/06/2022: BUN 22; Creatinine, Ser 0.55; Hemoglobin 15.4; Magnesium 1.6; Platelets 178.0; Potassium 3.6; Sodium 138; TSH 2.10  Recent Lipid Panel    Component Value Date/Time   CHOL 146 04/09/2021 1007   TRIG 94.0 04/09/2021 1007   HDL 40.40 04/09/2021 1007   CHOLHDL 4 04/09/2021 1007   VLDL 18.8 04/09/2021 1007   LDLCALC 87 04/09/2021 1007   LDLDIRECT 91.0 01/23/2022 1403     Risk Assessment/Calculations:                Physical Exam:    VS:  BP 120/68   Pulse 84   Ht '5\' 4"'$  (1.626 m)   Wt 148 lb 12.8 oz (67.5 kg)   SpO2 95%   BMI 25.54 kg/m     Wt Readings from Last 3 Encounters:  09/25/22 148 lb 12.8 oz (67.5 kg)  09/17/22 148 lb (67.1 kg)  09/05/22 148 lb 9.6 oz (67.4 kg)     GEN:  Well nourished, well developed in no acute distress HEENT: Normal NECK: No JVD; No carotid bruits LYMPHATICS: No lymphadenopathy CARDIAC: RRR, no murmurs, rubs, gallops RESPIRATORY:  Clear to auscultation without rales, wheezing or rhonchi  ABDOMEN: Soft, non-tender, non-distended MUSCULOSKELETAL:  No edema; No deformity  SKIN: Warm and dry NEUROLOGIC:  Alert and oriented x 3 PSYCHIATRIC:  Normal affect   ASSESSMENT:    1. Sinus tachycardia   2. Coronary artery calcification   3. Primary hypertension   4. Hypercholesterolemia    PLAN:    In order of problems listed above:  Sinus tachycardia. I have reviewed all her Ecgs. Her Ecg on 2/22 showed sinus tachycardia and not Atrial flutter. P wave morphology is normal. She has no symptoms of palpitations. She  does not need to take Eliquis or Toprol XL. May discontinue. Should resume ASA 81 mg daily given history of TIA HTN. Well controlled on losartan. Continue 100 mg daily Coronary and aortic calcification. Severe. No symptoms at this time so does not need further ischemic evaluation but needs aggressive risk factor modification. This includes heart healthy diet, regular aerobic exercise, smoking cessation, lipid control, and BP control. If she were to develop any symptoms in the future then further ischemic work up is appropriate. HLD. LDL 91. Given severe coronary calcification would target LDL < 55. Patient refuses to take a statin. Recommend starting Zetia 10 mg daily. Repeat lipids with PCP in 3 months. If not at goal would strongly encourage PCSK 9 inhibitor.  Tobacco abuse. Recommend smoking cessation.            Medication Adjustments/Labs and Tests Ordered: Current medicines are reviewed at length with the patient today.  Concerns regarding medicines are outlined above.  Orders Placed This Encounter  Procedures   EKG 12-Lead   Meds ordered this encounter  Medications   aspirin EC 81 MG tablet    Sig: Take 1 tablet (81 mg total) by mouth daily. Swallow whole.    Dispense:  90 tablet    Refill:  3   losartan (COZAAR) 100 MG tablet    Sig: Take 1 tablet (100 mg total) by mouth daily.    Dispense:  90 tablet    Refill:  1    Patient Instructions  You may stop Toprol XL and Eliquis  Resume ASA 81 mg daily  Resume losartan 100 mg daily  We will try you on Zetia 10 mg daily for your cholesterol. You should have repeat lipids checked in 3 months with your  primary care. Goal LDL < 55.  You need to quit smoking.    Signed, Tylee Newby [place], MD  09/25/2022 10:19 AM    [place]

## 2022-09-25 ENCOUNTER — Ambulatory Visit: Payer: Medicare Other | Attending: Internal Medicine | Admitting: Cardiology

## 2022-09-25 ENCOUNTER — Encounter: Payer: Self-pay | Admitting: Cardiology

## 2022-09-25 ENCOUNTER — Ambulatory Visit: Payer: Medicare Other | Admitting: Internal Medicine

## 2022-09-25 VITALS — BP 120/68 | HR 84 | Ht 64.0 in | Wt 148.8 lb

## 2022-09-25 DIAGNOSIS — I10 Essential (primary) hypertension: Secondary | ICD-10-CM

## 2022-09-25 DIAGNOSIS — I2584 Coronary atherosclerosis due to calcified coronary lesion: Secondary | ICD-10-CM

## 2022-09-25 DIAGNOSIS — E7800 Pure hypercholesterolemia, unspecified: Secondary | ICD-10-CM | POA: Diagnosis not present

## 2022-09-25 DIAGNOSIS — I2510 Atherosclerotic heart disease of native coronary artery without angina pectoris: Secondary | ICD-10-CM | POA: Insufficient documentation

## 2022-09-25 DIAGNOSIS — R000 Tachycardia, unspecified: Secondary | ICD-10-CM | POA: Diagnosis not present

## 2022-09-25 MED ORDER — ASPIRIN 81 MG PO TBEC: 81.0000 mg | DELAYED_RELEASE_TABLET | Freq: Every day | ORAL | 3 refills | Status: AC

## 2022-09-25 MED ORDER — EZETIMIBE 10 MG PO TABS: 10.0000 mg | ORAL_TABLET | Freq: Every day | ORAL | 3 refills | Status: DC

## 2022-09-25 MED ORDER — LOSARTAN POTASSIUM 100 MG PO TABS: 100.0000 mg | ORAL_TABLET | Freq: Every day | ORAL | 1 refills | Status: DC

## 2022-09-25 NOTE — Patient Instructions (Signed)
You may stop Toprol XL and Eliquis  Resume ASA 81 mg daily  Resume losartan 100 mg daily  We will try you on Zetia 10 mg daily for your cholesterol. You should have repeat lipids checked in 3 months with your primary care. Goal LDL < 55.  You need to quit smoking.

## 2022-10-14 DIAGNOSIS — Z961 Presence of intraocular lens: Secondary | ICD-10-CM | POA: Diagnosis not present

## 2022-10-14 DIAGNOSIS — H526 Other disorders of refraction: Secondary | ICD-10-CM | POA: Diagnosis not present

## 2022-10-14 DIAGNOSIS — H353131 Nonexudative age-related macular degeneration, bilateral, early dry stage: Secondary | ICD-10-CM | POA: Diagnosis not present

## 2022-10-14 DIAGNOSIS — H26493 Other secondary cataract, bilateral: Secondary | ICD-10-CM | POA: Diagnosis not present

## 2022-10-14 DIAGNOSIS — H43813 Vitreous degeneration, bilateral: Secondary | ICD-10-CM | POA: Diagnosis not present

## 2022-10-16 DIAGNOSIS — N3946 Mixed incontinence: Secondary | ICD-10-CM | POA: Diagnosis not present

## 2022-10-16 DIAGNOSIS — R350 Frequency of micturition: Secondary | ICD-10-CM | POA: Diagnosis not present

## 2022-10-28 ENCOUNTER — Encounter: Payer: Self-pay | Admitting: *Deleted

## 2022-10-30 ENCOUNTER — Telehealth: Payer: Self-pay

## 2022-10-30 ENCOUNTER — Ambulatory Visit: Payer: Medicare Other | Admitting: Surgery

## 2022-10-30 ENCOUNTER — Other Ambulatory Visit: Payer: Medicare Other

## 2022-10-30 NOTE — Telephone Encounter (Signed)
Called pt to schedule AWV. Please schedule with health coach or Neomi Laidler.  

## 2022-11-15 DIAGNOSIS — H26492 Other secondary cataract, left eye: Secondary | ICD-10-CM | POA: Diagnosis not present

## 2022-11-19 ENCOUNTER — Telehealth: Payer: Self-pay | Admitting: Family Medicine

## 2022-11-19 NOTE — Telephone Encounter (Signed)
Contacted [name] to schedule their annual wellness visit. Appointment made for 11/27/2022.  [facility] AWV TEAM Direct Dial [phone]

## 2022-11-20 ENCOUNTER — Encounter: Payer: Self-pay | Admitting: Surgery

## 2022-11-20 ENCOUNTER — Ambulatory Visit
Admission: RE | Admit: 2022-11-20 | Discharge: 2022-11-20 | Disposition: A | Discharge status: Home / Self Care | Payer: Medicare Other | Source: Ambulatory Visit | Attending: Surgery | Admitting: Surgery

## 2022-11-20 ENCOUNTER — Telehealth: Payer: Self-pay | Admitting: Family Medicine

## 2022-11-20 ENCOUNTER — Ambulatory Visit (INDEPENDENT_AMBULATORY_CARE_PROVIDER_SITE_OTHER): Payer: Medicare Other | Admitting: Surgery

## 2022-11-20 VITALS — BP 128/86 | HR 77 | Resp 18 | Ht 64.0 in | Wt 148.0 lb

## 2022-11-20 DIAGNOSIS — I7121 Aneurysm of the ascending aorta, without rupture: Secondary | ICD-10-CM | POA: Diagnosis not present

## 2022-11-20 DIAGNOSIS — I700 Atherosclerosis of aorta: Secondary | ICD-10-CM | POA: Diagnosis not present

## 2022-11-20 DIAGNOSIS — I712 Thoracic aortic aneurysm, without rupture, unspecified: Secondary | ICD-10-CM

## 2022-11-20 DIAGNOSIS — J439 Emphysema, unspecified: Secondary | ICD-10-CM | POA: Diagnosis not present

## 2022-11-20 MED ORDER — IOPAMIDOL (ISOVUE-370) INJECTION 76%
75.0000 mL | Freq: Once | INTRAVENOUS | Status: AC | PRN
  Administered 2022-11-20: 75 mL via INTRAVENOUS

## 2022-11-20 NOTE — Progress Notes (Signed)
HPI:  The patient is a 72 year old woman with a history of hypertension, hyperlipidemia, active smoking, and a 4.2 cm ascending aortic aneurysm who returns for aortic surveillance.  She has had no changes to her medical history over the past year and continues to feel well without chest pain or shortness of breath.  Current Outpatient Medications  Medication Sig Dispense Refill   aspirin EC 81 MG tablet Take 1 tablet (81 mg total) by mouth daily. Swallow whole. 90 tablet 3   ezetimibe (ZETIA) 10 MG tablet Take 1 tablet (10 mg total) by mouth daily. 90 tablet 3   hydrochlorothiazide (HYDRODIURIL) 50 MG tablet TAKE 1 TABLET(50 MG) BY MOUTH DAILY 90 tablet 1   losartan (COZAAR) 100 MG tablet Take 1 tablet (100 mg total) by mouth daily. 90 tablet 1   Multiple Vitamins-Minerals (MULTIVITAMIN ADULT PO) Take 1 Dose by mouth.     No current facility-administered medications for this visit.     Physical Exam: BP 128/86 (BP Location: Left Arm, Patient Position: Sitting)   Pulse 77   Resp 18   Ht 5\' 4"  (1.626 m)   Wt 148 lb (67.1 kg)   SpO2 95% Comment: RA  BMI 25.40 kg/m  She looks well. Cardiac exam shows regular rate and rhythm with normal heart sounds.  There is no murmur. There is no peripheral edema.  Diagnostic Tests:  Narrative & Impression  CLINICAL DATA:  Thoracic aortic aneurysm.   EXAM: CT ANGIOGRAPHY CHEST WITH CONTRAST   TECHNIQUE: Multidetector CT imaging of the chest was performed using the standard protocol during bolus administration of intravenous contrast. Multiplanar CT image reconstructions and MIPs were obtained to evaluate the vascular anatomy.   RADIATION DOSE REDUCTION: This exam was performed according to the departmental dose-optimization program which includes automated exposure control, adjustment of the mA and/or kV according to patient size and/or use of iterative reconstruction technique.   CONTRAST:  75mL ISOVUE-370 IOPAMIDOL (ISOVUE-370)  INJECTION 76%   COMPARISON:  August 08, 2022.  September 17, 2021.   FINDINGS: Cardiovascular: Grossly stable 4.1 cm ascending thoracic aortic aneurysm is noted. No dissection is noted. Normal cardiac size. No pericardial effusion. Coronary artery calcifications are noted.   Mediastinum/Nodes: No enlarged mediastinal, hilar, or axillary lymph nodes. Thyroid gland, trachea, and esophagus demonstrate no significant findings.   Lungs/Pleura: No pneumothorax or pleural effusion is noted. Emphysematous disease is noted. Minimal bibasilar subsegmental atelectasis or scarring is noted.   Upper Abdomen: No acute abnormality.   Musculoskeletal: No chest wall abnormality. No acute or significant osseous findings.   Review of the MIP images confirms the above findings.   IMPRESSION: Grossly stable 4.1 cm ascending thoracic aortic aneurysm. Recommend annual imaging followup by CTA or MRA. This recommendation follows 2010 ACCF/AHA/AATS/ACR/ASA/SCA/SCAI/SIR/STS/SVM Guidelines for the Diagnosis and Management of Patients with Thoracic Aortic Disease. Circulation. 2010; 121: W098-J191. Aortic aneurysm NOS (ICD10-I71.9).   Coronary artery calcifications are noted suggesting coronary artery disease.   Aortic Atherosclerosis (ICD10-I70.0) and Emphysema (ICD10-J43.9).     Electronically Signed   By: Lupita Raider M.D.   On: 11/20/2022 10:51      Impression:  She has a stable 4.1 cm fusiform ascending aortic aneurysm that is well below the surgical threshold of 5.5 cm.  There are no lung nodules.  There are emphysematous changes.  There is also some calcification of the coronary arteries but she remains asymptomatic.  I reviewed the CTA images with her and answered her questions.  I  stressed the importance of continued good blood pressure control in preventing further enlargement and acute aortic dissection.  I advised her against doing any heavy lifting or strenuous physical activity that may  require a Valsalva maneuver and could suddenly raise her blood pressure to high levels.  I also advised her to abstain from smoking.  Plan:  I will see her back in 1 year with a CTA of the chest for aortic surveillance.  I spent 10 minutes performing this established patient evaluation and > 50% of this time was spent face to face counseling and coordinating the care of this patient's aortic aneurysm.    Alleen Borne, MD [facility] [phone]

## 2022-11-20 NOTE — Telephone Encounter (Signed)
Contacted [name] to schedule their annual wellness visit. Appointment made for 12/04/2022. [facility] AWV TEAM Direct Dial [phone] *(signature)*

## 2022-11-29 DIAGNOSIS — N3946 Mixed incontinence: Secondary | ICD-10-CM | POA: Diagnosis not present

## 2022-11-29 DIAGNOSIS — R350 Frequency of micturition: Secondary | ICD-10-CM | POA: Diagnosis not present

## 2022-12-04 ENCOUNTER — Ambulatory Visit (INDEPENDENT_AMBULATORY_CARE_PROVIDER_SITE_OTHER): Payer: Medicare Other

## 2022-12-04 VITALS — Wt 148.0 lb

## 2022-12-04 DIAGNOSIS — Z0000 Encounter for general adult medical examination without abnormal findings: Secondary | ICD-10-CM | POA: Diagnosis not present

## 2022-12-04 NOTE — Progress Notes (Signed)
I connected with  [name] on 12/04/22 by a audio enabled telemedicine application and verified that I am speaking with the correct person using two identifiers.  Patient Location: Home  Provider Location: Home Office  I discussed the limitations of evaluation and management by telemedicine. The patient expressed understanding and agreed to proceed.    Patient Medicare AWV questionnaire was completed by the patient on 12/02/22; I have confirmed that all information answered by patient is correct and no changes since this date.      Subjective:   [name] is a 72 y.o. female who presents for Medicare Annual (Subsequent) preventive examination.  Review of Systems     Cardiac Risk Factors include: advanced age (>10men, >65 women);dyslipidemia;hypertension;smoking/ tobacco exposure     Objective:    Today's Vitals   12/04/22 1305  Weight: 148 lb (67.1 kg)   Body mass index is 25.4 kg/m.     12/04/2022    1:11 PM 09/25/2021    1:32 PM 09/20/2021   12:05 PM 04/12/2020    8:20 AM 02/10/2019    8:03 AM 11/19/2016   10:24 AM 01/12/2016   12:10 PM  Advanced Directives  Does Patient Have a Medical Advance Directive? Yes Yes Yes Yes Yes Yes;No Yes  Type of Estate agent of [place];Living will Healthcare Power of [place];Living will Healthcare Power of [place];Living will Healthcare Power of [place];Living will Healthcare Power of [place];Living will;Out of facility DNR (pink MOST or yellow form)  Healthcare Power of [place];Living will  Does patient want to make changes to medical advance directive?   No - Patient declined   Yes (MAU/Ambulatory/Procedural Areas - Information given)   Copy of Healthcare Power of Attorney in Chart? No - copy requested No - copy requested No - copy requested No - copy requested No - copy requested  No - copy requested  Would patient like information on creating a medical advance directive?  No - Patient declined No - Patient  declined        Current Medications (verified) Outpatient Encounter Medications as of 12/04/2022  Medication Sig   aspirin EC 81 MG tablet Take 1 tablet (81 mg total) by mouth daily. Swallow whole.   ezetimibe (ZETIA) 10 MG tablet Take 1 tablet (10 mg total) by mouth daily.   hydrochlorothiazide (HYDRODIURIL) 50 MG tablet TAKE 1 TABLET(50 MG) BY MOUTH DAILY   losartan (COZAAR) 100 MG tablet Take 1 tablet (100 mg total) by mouth daily.   Multiple Vitamins-Minerals (MULTIVITAMIN ADULT PO) Take 1 Dose by mouth.   Vibegron (GEMTESA PO) Take by mouth.   No facility-administered encounter medications on file as of 12/04/2022.    Allergies (verified) Sulfa antibiotics   History: Past Medical History:  Diagnosis Date   Allergic rhinitis    Astigmatism    right eye   Back pain    Dr. Jennet Maduro ([facility])   Basal cell carcinoma    Closed fracture of fifth metatarsal bone of left foot 04/12/2022   Combined form of age-related cataract, both eyes    Combined forms of age-related cataract of right eye 06/03/2019   Cystocele    Demyelinating disease (HCC) 2003   No definitive workup completed; prior records   Depression    Disorder of refraction    both eyes   Early stage nonexudative age-related macular degeneration of both eyes    Exophoria    Fibrocystic breast    GERD (gastroesophageal reflux disease)    History  of echocardiogram 12/2012   Dr. 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   Posterior vitreous detachment, both eyes    Postmenopausal    Pseudophakia of left eye    Psoriasis 2009   Regular astigmatism, right eye 06/03/2019   S/P lumbar discectomy 09/24/2021   Sciatica of left side 05/2013   TIA (transient ischemic attack) 2000   Urinary  incontinence    Past Surgical History:  Procedure Laterality Date   CHOLECYSTECTOMY  11/2001   LUMBAR DISC SURGERY  04/2005   R L4-5  and L5-S1   SKIN BIOPSY Left 1999   left hand bcc vs scc   TONSILLECTOMY AND ADENOIDECTOMY     Family History  Problem Relation Age of Onset   Rheum arthritis Mother    Alcohol abuse Mother    Heart disease Father 55       MI at 39; died   Diabetes Father    Dementia Maternal Grandmother    Social History   Socioeconomic History   Marital status: Widowed    Spouse name: Not on file   Number of children: Not on file   Years of education: Not on file   Highest education level: Some college, no degree  Occupational History   Not on file  Tobacco Use   Smoking status: Every Day    Packs/day: 1.00    Years: 3.00    Additional pack years: 0.00    Total pack years: 3.00    Types: Cigarettes   Smokeless tobacco: Never  Vaping Use   Vaping Use: Never used  Substance and Sexual Activity   Alcohol use: No    Alcohol/week: 0.0 standard drinks of alcohol   Drug use: No   Sexual activity: Never  Other Topics Concern   Not on file  Social History Narrative   Widower 2018 (Damon). 2 sons.   College. Retired.    Former smoker (quit 04/2005- 30+ pack year), occasional etoh, no drugs   Drinks caffeine, uses herbal remedies, daily vitamin use   Wears her seatbelt, wears her bicycle helmet   Exercises routinely   Smoke detector in the home.    Firearms in the home (locked)   Feels safe in her relationships.    Social Determinants of Health   Financial Resource Strain: Low Risk  (12/02/2022)   Overall Financial Resource Strain (CARDIA)    Difficulty of Paying Living Expenses: Not hard at all  Food Insecurity: No Food Insecurity (12/02/2022)   Hunger Vital Sign    Worried About Running Out of Food in the Last Year: Never true    Ran Out of Food in the Last Year: Never true  Transportation Needs: No Transportation Needs (12/02/2022)   PRAPARE -  Administrator, Civil Service (Medical): No    Lack of Transportation (Non-Medical): No  Physical Activity: Insufficiently Active (12/02/2022)   Exercise Vital Sign    Days of Exercise per Week: 1 day    Minutes of Exercise per Session: 10 min  Stress: No Stress Concern Present (12/02/2022)   [employer] of Occupational Health - Occupational Stress Questionnaire    Feeling of Stress : Not at all  Social Connections: Moderately Integrated (12/02/2022)   Social Connection and Isolation Panel [NHANES]    Frequency  of Communication with Friends and Family: Twice a week    Frequency of Social Gatherings with Friends and Family: Once a week    Attends Religious Services: More than 4 times per year    Active Member of [employer] or Organizations: Yes    Attends Banker Meetings: Never    Marital Status: Widowed    Tobacco Counseling Ready to quit: Not Answered Counseling given: Not Answered   Clinical Intake:  Pre-visit preparation completed: Yes  Pain : No/denies pain     BMI - recorded: 25.4 Nutritional Status: BMI 25 -29 Overweight Nutritional Risks: None Diabetes: No  How often do you need to have someone help you when you read instructions, pamphlets, or other written materials from your doctor or pharmacy?: 1 - Never  Diabetic?no  Interpreter Needed?: No  Information entered by :: Lanier Ensign, LPN   Activities of Daily Living    12/02/2022    1:34 PM  In your present state of health, do you have any difficulty performing the following activities:  Hearing? 0  Vision? 0  Difficulty concentrating or making decisions? 0  Walking or climbing stairs? 0  Dressing or bathing? 0  Doing errands, shopping? 0  Preparing Food and eating ? N  Using the Toilet? N  In the past six months, have you accidently leaked urine? Y  Comment at times  Do you have problems with loss of bowel control? N  Managing your Medications? N  Managing your  Finances? N  Housekeeping or managing your Housekeeping? N    Patient Care Team: Natalia Leatherwood, DO as PCP - General (Family Medicine) Vivi Barrack, DPM as Consulting Physician (Podiatry) Coletta Memos, MD as Consulting Physician (Neurosurgery)  Indicate any recent Medical Services you may have received from other than [facility] providers in the past year (date may be approximate).     Assessment:   This is a routine wellness examination for Faizah.  Hearing/Vision screen Hearing Screening - Comments:: Pt denies any hearing issues  Vision Screening - Comments:: Pt follows up with Dr Neale Burly for annual eye exams   Dietary issues and exercise activities discussed: Current Exercise Habits: Home exercise routine, Type of exercise: walking, Time (Minutes): 10, Frequency (Times/Week): 1, Weekly Exercise (Minutes/Week): 10   Goals Addressed               This Visit's Progress     Patient Stated (pt-stated)        Stay healthy        Depression Screen    12/04/2022    1:10 PM 09/05/2022   10:22 AM 07/01/2022   10:33 AM 09/24/2021   10:12 AM 04/09/2021    9:41 AM 04/12/2020    8:22 AM 02/25/2020   10:34 AM  PHQ 2/9 Scores  PHQ - 2 Score 0 0 0 0 0 0 0    Fall Risk    12/02/2022    1:34 PM 07/01/2022   10:33 AM 01/19/2022    2:20 PM 09/25/2021    1:32 PM 09/24/2021   10:12 AM  Fall Risk   Falls in the past year? 1 1 0 0 0  Number falls in past yr: 0 0  0 0  Injury with Fall? 1 1  0 0  Risk for fall due to : Impaired vision No Fall Risks   No Fall Risks  Follow up Falls prevention discussed Falls evaluation completed  Falls evaluation completed Falls evaluation completed  FALL RISK PREVENTION PERTAINING TO THE HOME:  Any stairs in or around the home? Yes  If so, are there any without handrails? No  Home free of loose throw rugs in walkways, pet beds, electrical cords, etc? Yes  Adequate lighting in your home to reduce risk of falls? Yes   ASSISTIVE DEVICES  UTILIZED TO PREVENT FALLS:  Life alert? No  Use of a cane, walker or w/c? No  Grab bars in the bathroom? Yes  Shower chair or bench in shower? Yes  Elevated toilet seat or a handicapped toilet? No   TIMED UP AND GO:  Was the test performed? No .   Cognitive Function:        12/04/2022    1:13 PM 09/25/2021    1:31 PM 04/12/2020    8:27 AM  6CIT Screen  What Year? 0 points 0 points 0 points  What month? 0 points 0 points 0 points  What time? 0 points 0 points 0 points  Count back from 20 0 points 0 points 0 points  Months in reverse 0 points 0 points 0 points  Repeat phrase 0 points 0 points 0 points  Total Score 0 points 0 points 0 points    Immunizations Immunization History  Administered Date(s) Administered   Moderna SARS-COV2 Booster Vaccination 11/07/2020, 03/22/2021   Moderna Sars-Covid-2 Vaccination 08/14/2019, 09/13/2019, 05/09/2020   PNEUMOCOCCAL CONJUGATE-20 02/27/2022   Tdap 12/21/2013   Zoster, Live 06/24/2013    TDAP status: Up to date  Flu Vaccine status: Declined, Education has been provided regarding the importance of this vaccine but patient still declined. Advised may receive this vaccine at local pharmacy or Health Dept. Aware to provide a copy of the vaccination record if obtained from local pharmacy or Health Dept. Verbalized acceptance and understanding.  Pneumococcal vaccine status: Up to date  Covid-19 vaccine status: Completed vaccines  Qualifies for Shingles Vaccine? No    Screening Tests Health Maintenance  Topic Date Due   INFLUENZA VACCINE  02/13/2023   Medicare Annual Wellness (AWV)  12/04/2023   DTaP/Tdap/Td (2 - Td or Tdap) 12/22/2023   Fecal DNA (Cologuard)  09/08/2025   Pneumonia Vaccine 12+ Years old  Completed   DEXA SCAN  Completed   Hepatitis C Screening  Completed   HPV VACCINES  Aged Out   MAMMOGRAM  Discontinued   COVID-19 Vaccine  Discontinued   Zoster Vaccines- Shingrix  Discontinued    Health  Maintenance  There are no preventive care reminders to display for this patient.   Colorectal cancer screening: Type of screening: Cologuard. Completed 09/08/22. Repeat every 3 years  Mammogram status: No longer required due to pt choice.    Lung Cancer Screening: (Low Dose CT Chest recommended if Age 18-80 years, 30 pack-year currently smoking OR have quit w/in 15years.) does qualify.   Lung Cancer Screening Referral: ordered 09/02/22  Additional Screening:  Hepatitis C Screening: Completed 01/25/16  Vision Screening: Recommended annual ophthalmology exams for early detection of glaucoma and other disorders of the eye. Is the patient up to date with their annual eye exam?  Yes  Who is the provider or what is the name of the office in which the patient attends annual eye exams? Dr Neale Burly  If pt is not established with a provider, would they like to be referred to a provider to establish care? No .   Dental Screening: Recommended annual dental exams for proper oral hygiene  Community Resource Referral / Chronic Care Management: CRR required  this visit?  No   CCM required this visit?  No      Plan:     I have personally reviewed and noted the following in the patient's chart:   Medical and social history Use of alcohol, tobacco or illicit drugs  Current medications and supplements including opioid prescriptions. Patient is not currently taking opioid prescriptions. Functional ability and status Nutritional status Physical activity Advanced directives List of other physicians Hospitalizations, surgeries, and ER visits in previous 12 months Vitals Screenings to include cognitive, depression, and falls Referrals and appointments  In addition, I have reviewed and discussed with patient certain preventive protocols, quality metrics, and best practice recommendations. A written personalized care plan for preventive services as well as general preventive health recommendations  were provided to patient.     Marzella Schlein, LPN   [id number]   Nurse Notes: none

## 2022-12-04 NOTE — Patient Instructions (Signed)
Ms. [name] , Thank you for taking time to come for your Medicare Wellness Visit. I appreciate your ongoing commitment to your health goals. Please review the following plan we discussed and let me know if I can assist you in the future.   These are the goals we discussed:  Goals       Patient Stated      Maintain weight with healthy eating & increasing activity      Patient Stated (pt-stated)      Stay healthy         This is a list of the screening recommended for you and due dates:  Health Maintenance  Topic Date Due   Flu Shot  02/13/2023   Medicare Annual Wellness Visit  12/04/2023   DTaP/Tdap/Td vaccine (2 - Td or Tdap) 12/22/2023   Cologuard (Stool DNA test)  09/08/2025   Pneumonia Vaccine  Completed   DEXA scan (bone density measurement)  Completed   Hepatitis C Screening: USPSTF Recommendation to screen - Ages 21-79 yo.  Completed   HPV Vaccine  Aged Out   Mammogram  Discontinued   COVID-19 Vaccine  Discontinued   Zoster (Shingles) Vaccine  Discontinued    Advanced directives: Please bring a copy of your health care power of attorney and living will to the office at your convenience.  Conditions/risks identified: stay healthy  Next appointment: Follow up in one year for your annual wellness visit    Preventive Care 65 Years and Older, Female Preventive care refers to lifestyle choices and visits with your health care provider that can promote health and wellness. What does preventive care include? A yearly physical exam. This is also called an annual well check. Dental exams once or twice a year. Routine eye exams. Ask your health care provider how often you should have your eyes checked. Personal lifestyle choices, including: Daily care of your teeth and gums. Regular physical activity. Eating a healthy diet. Avoiding tobacco and drug use. Limiting alcohol use. Practicing safe sex. Taking low-dose aspirin every day. Taking vitamin and mineral supplements as  recommended by your health care provider. What happens during an annual well check? The services and screenings done by your health care provider during your annual well check will depend on your age, overall health, lifestyle risk factors, and family history of disease. Counseling  Your health care provider may ask you questions about your: Alcohol use. Tobacco use. Drug use. Emotional well-being. Home and relationship well-being. Sexual activity. Eating habits. History of falls. Memory and ability to understand (cognition). Work and work Astronomer. Reproductive health. Screening  You may have the following tests or measurements: Height, weight, and BMI. Blood pressure. Lipid and cholesterol levels. These may be checked every 5 years, or more frequently if you are over 32 years old. Skin check. Lung cancer screening. You may have this screening every year starting at age 64 if you have a 30-pack-year history of smoking and currently smoke or have quit within the past 15 years. Fecal occult blood test (FOBT) of the stool. You may have this test every year starting at age 64. Flexible sigmoidoscopy or colonoscopy. You may have a sigmoidoscopy every 5 years or a colonoscopy every 10 years starting at age 44. Hepatitis C blood test. Hepatitis B blood test. Sexually transmitted disease (STD) testing. Diabetes screening. This is done by checking your blood sugar (glucose) after you have not eaten for a while (fasting). You may have this done every 1-3 years. Bone density scan.  This is done to screen for osteoporosis. You may have this done starting at age 4. Mammogram. This may be done every 1-2 years. Talk to your health care provider about how often you should have regular mammograms. Talk with your health care provider about your test results, treatment options, and if necessary, the need for more tests. Vaccines  Your health care provider may recommend certain vaccines, such  as: Influenza vaccine. This is recommended every year. Tetanus, diphtheria, and acellular pertussis (Tdap, Td) vaccine. You may need a Td booster every 10 years. Zoster vaccine. You may need this after age 97. Pneumococcal 13-valent conjugate (PCV13) vaccine. One dose is recommended after age 63. Pneumococcal polysaccharide (PPSV23) vaccine. One dose is recommended after age 82. Talk to your health care provider about which screenings and vaccines you need and how often you need them. This information is not intended to replace advice given to you by your health care provider. Make sure you discuss any questions you have with your health care provider. Document Released: 07/28/2015 Document Revised: 03/20/2016 Document Reviewed: 05/02/2015 Elsevier Interactive Patient Education  2017 [employer].  Fall Prevention in the Home Falls can cause injuries. They can happen to people of all ages. There are many things you can do to make your home safe and to help prevent falls. What can I do on the outside of my home? Regularly fix the edges of walkways and driveways and fix any cracks. Remove anything that might make you trip as you walk through a door, such as a raised step or threshold. Trim any bushes or trees on the path to your home. Use bright outdoor lighting. Clear any walking paths of anything that might make someone trip, such as rocks or tools. Regularly check to see if handrails are loose or broken. Make sure that both sides of any steps have handrails. Any raised decks and porches should have guardrails on the edges. Have any leaves, snow, or ice cleared regularly. Use sand or salt on walking paths during winter. Clean up any spills in your garage right away. This includes oil or grease spills. What can I do in the bathroom? Use night lights. Install grab bars by the toilet and in the tub and shower. Do not use towel bars as grab bars. Use non-skid mats or decals in the tub or  shower. If you need to sit down in the shower, use a plastic, non-slip stool. Keep the floor dry. Clean up any water that spills on the floor as soon as it happens. Remove soap buildup in the tub or shower regularly. Attach bath mats securely with double-sided non-slip rug tape. Do not have throw rugs and other things on the floor that can make you trip. What can I do in the bedroom? Use night lights. Make sure that you have a light by your bed that is easy to reach. Do not use any sheets or blankets that are too big for your bed. They should not hang down onto the floor. Have a firm chair that has side arms. You can use this for support while you get dressed. Do not have throw rugs and other things on the floor that can make you trip. What can I do in the kitchen? Clean up any spills right away. Avoid walking on wet floors. Keep items that you use a lot in easy-to-reach places. If you need to reach something above you, use a strong step stool that has a grab bar. Keep electrical cords  out of the way. Do not use floor polish or wax that makes floors slippery. If you must use wax, use non-skid floor wax. Do not have throw rugs and other things on the floor that can make you trip. What can I do with my stairs? Do not leave any items on the stairs. Make sure that there are handrails on both sides of the stairs and use them. Fix handrails that are broken or loose. Make sure that handrails are as long as the stairways. Check any carpeting to make sure that it is firmly attached to the stairs. Fix any carpet that is loose or worn. Avoid having throw rugs at the top or bottom of the stairs. If you do have throw rugs, attach them to the floor with carpet tape. Make sure that you have a light switch at the top of the stairs and the bottom of the stairs. If you do not have them, ask someone to add them for you. What else can I do to help prevent falls? [place] shoes that: Do not have high heels. Have  rubber bottoms. Are comfortable and fit you well. Are closed at the toe. Do not [place] sandals. If you use a stepladder: Make sure that it is fully opened. Do not climb a closed stepladder. Make sure that both sides of the stepladder are locked into place. Ask someone to hold it for you, if possible. Clearly mark and make sure that you can see: Any grab bars or handrails. First and last steps. Where the edge of each step is. Use tools that help you move around (mobility aids) if they are needed. These include: Canes. Walkers. Scooters. Crutches. Turn on the lights when you go into a dark area. Replace any light bulbs as soon as they burn out. Set up your furniture so you have a clear path. Avoid moving your furniture around. If any of your floors are uneven, fix them. If there are any pets around you, be aware of where they are. Review your medicines with your doctor. Some medicines can make you feel dizzy. This can increase your chance of falling. Ask your doctor what other things that you can do to help prevent falls. This information is not intended to replace advice given to you by your health care provider. Make sure you discuss any questions you have with your health care provider. Document Released: 04/27/2009 Document Revised: 12/07/2015 Document Reviewed: 08/05/2014 Elsevier Interactive Patient Education  2017 [employer].

## 2022-12-20 ENCOUNTER — Other Ambulatory Visit: Payer: Self-pay

## 2022-12-20 NOTE — Telephone Encounter (Signed)
RF request for hydrochlorothiazide LOV: 09/17/22 Next ov:  n/a Last written: 07/01/22 (90,1)   Pt is due for appt

## 2022-12-23 DIAGNOSIS — R350 Frequency of micturition: Secondary | ICD-10-CM | POA: Diagnosis not present

## 2023-01-02 DIAGNOSIS — N3946 Mixed incontinence: Secondary | ICD-10-CM | POA: Diagnosis not present

## 2023-01-02 DIAGNOSIS — R350 Frequency of micturition: Secondary | ICD-10-CM | POA: Diagnosis not present

## 2023-01-27 DIAGNOSIS — R350 Frequency of micturition: Secondary | ICD-10-CM | POA: Diagnosis not present

## 2023-01-27 DIAGNOSIS — R8279 Other abnormal findings on microbiological examination of urine: Secondary | ICD-10-CM | POA: Diagnosis not present

## 2023-01-27 DIAGNOSIS — N3946 Mixed incontinence: Secondary | ICD-10-CM | POA: Diagnosis not present

## 2023-02-18 ENCOUNTER — Other Ambulatory Visit: Payer: Self-pay | Admitting: Family Medicine

## 2023-02-18 DIAGNOSIS — Z1231 Encounter for screening mammogram for malignant neoplasm of breast: Secondary | ICD-10-CM

## 2023-03-13 IMAGING — CT CT ANGIO CHEST
3 of 8 series · 18 of 46 positions shown · IV contrast (APPLIED)
Comparison: CT chest without contrast 08/08/2021 and 05/22/2020

CLINICAL DATA: Aortic aneurysm known or suspected. Follow-up
ascending aortic aneurysm measuring 4.2 cm.

EXAM:
CT ANGIOGRAPHY CHEST WITH CONTRAST
TECHNIQUE: Multidetector CT imaging of the chest was performed using the
standard protocol during bolus administration of intravenous
contrast. Multiplanar CT image reconstructions and MIPs were
obtained to evaluate the vascular anatomy.

[Series 4: arterial · axial · arterial · 0.63mm/px · z∈[-205,+45]mm · 13 of 147 slices shown]
[im 11/147  lung]
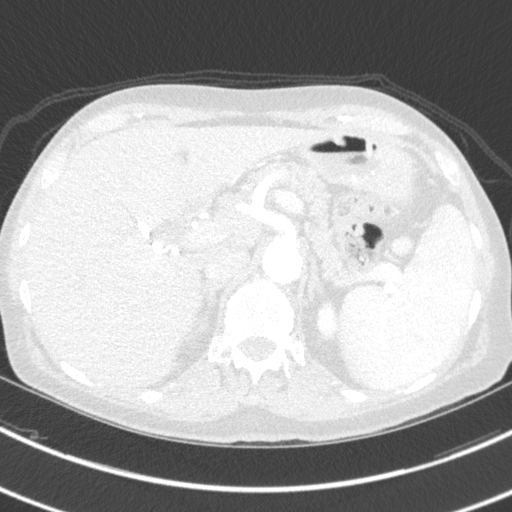
[im 21/147  soft-tissue]
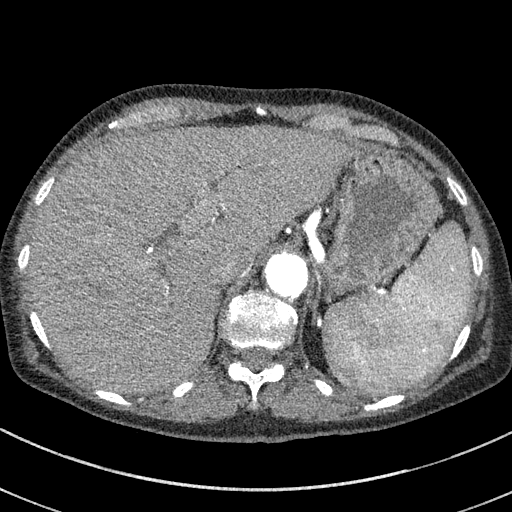
[im 32/147  lung]
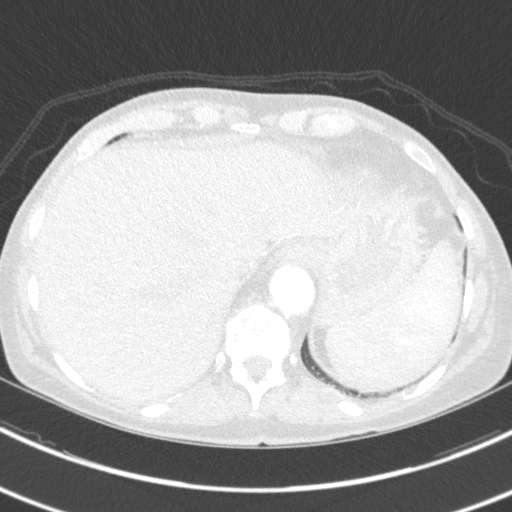
[im 42/147  soft-tissue]
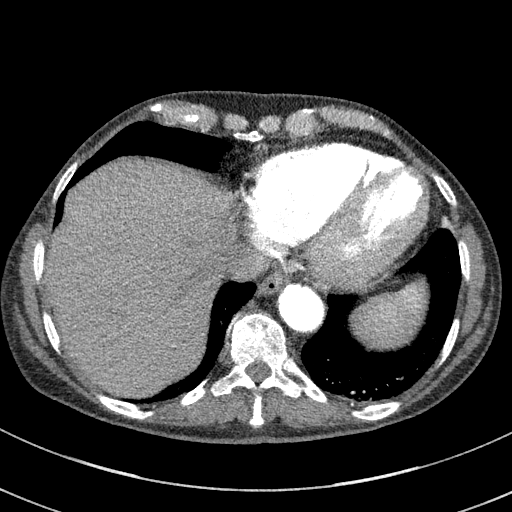
[im 53/147  lung]
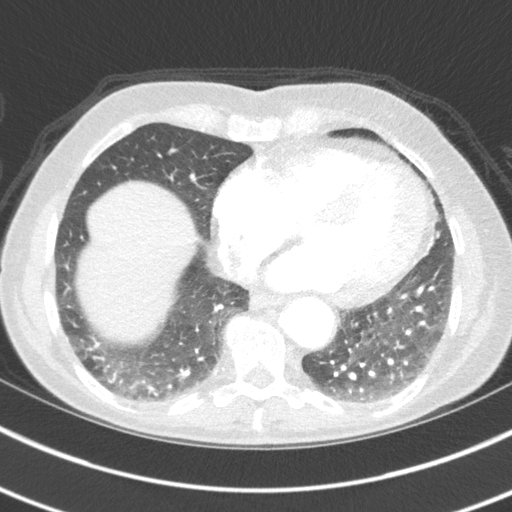
[im 63/147  soft-tissue]
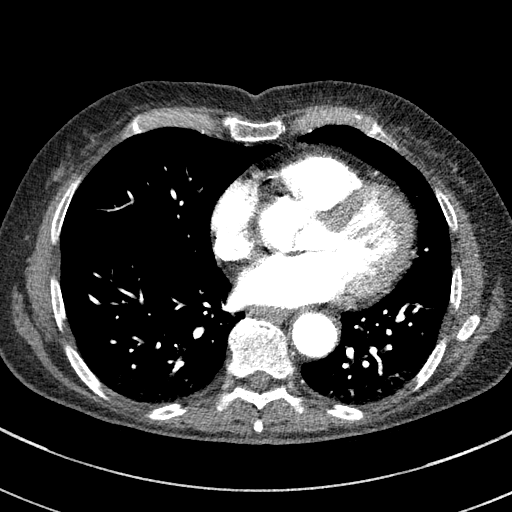
[im 74/147  lung]
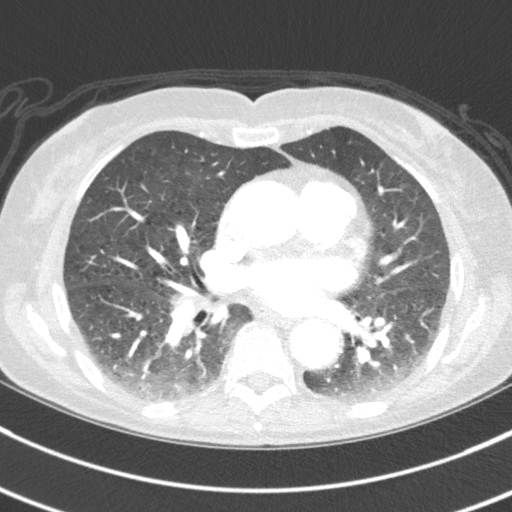
[im 84/147  soft-tissue]
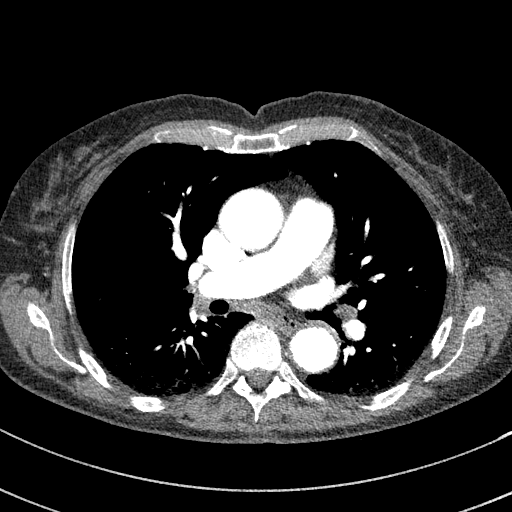
[im 94/147  lung]
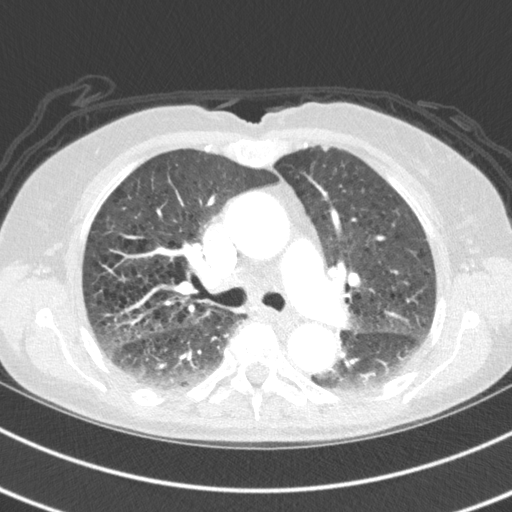
[im 105/147  soft-tissue]
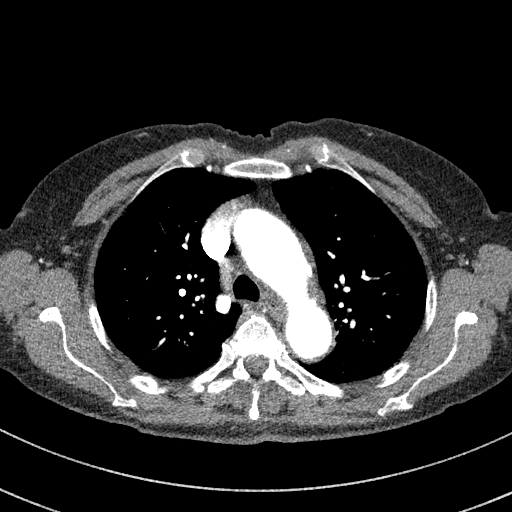
[im 115/147  lung]
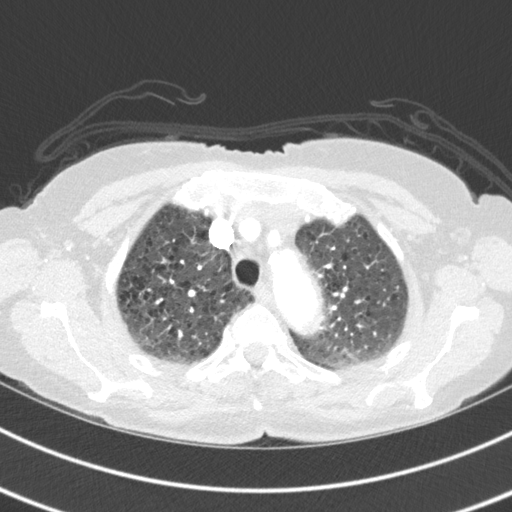
[im 126/147  soft-tissue]
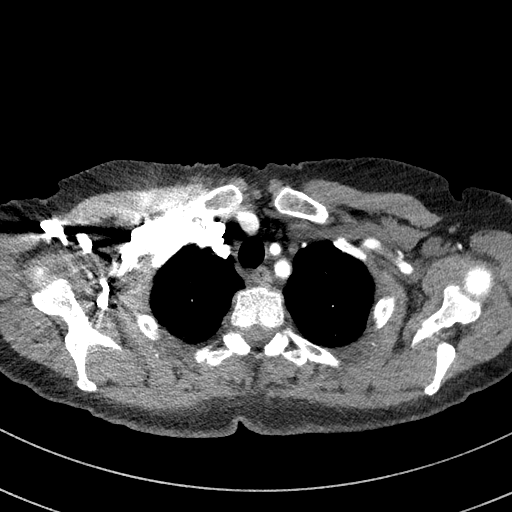
[im 136/147  lung]
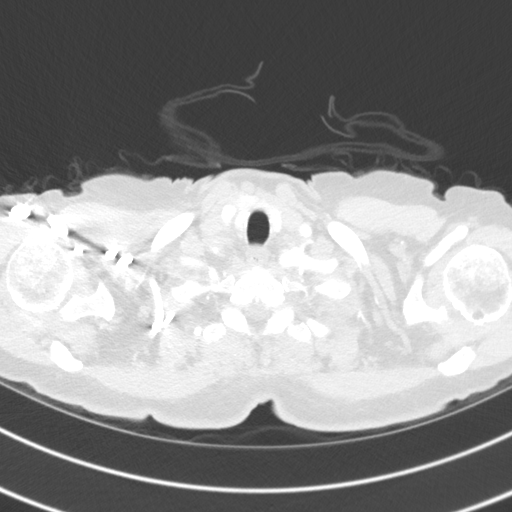

[Series 5: lung · axial · 0.63mm/px · z∈[-205,-163]mm · 2 of 147 slices shown]
[im 11/147  soft-tissue]
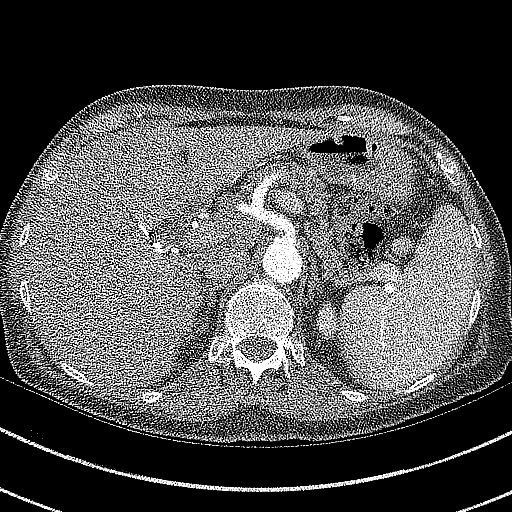
[im 32/147  soft-tissue]
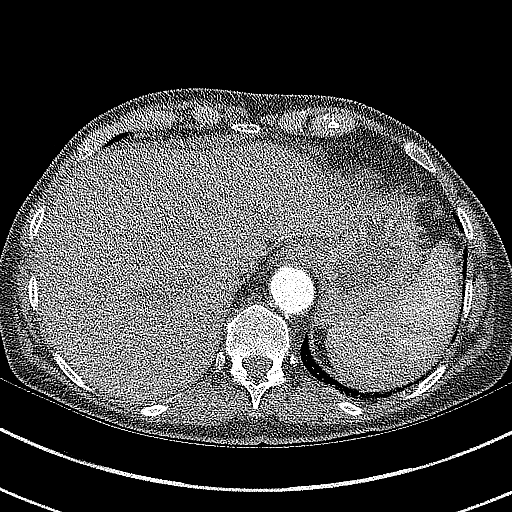

[Series 6: coronals · coronal · 0.58mm/px · 3 of 101 slices shown]
[im 26/101  soft-tissue]
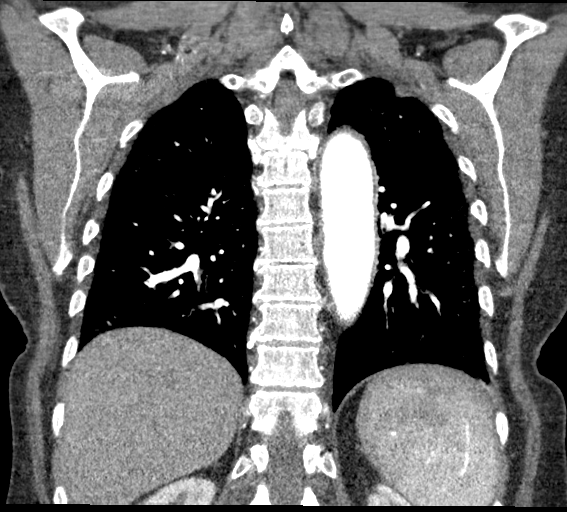
[im 51/101  soft-tissue]
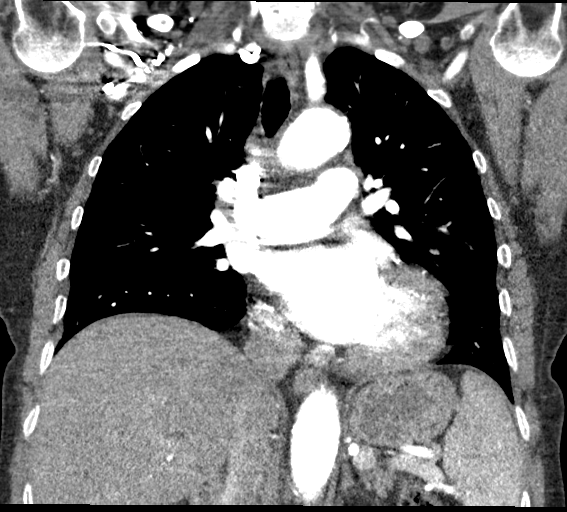
[im 76/101  soft-tissue]
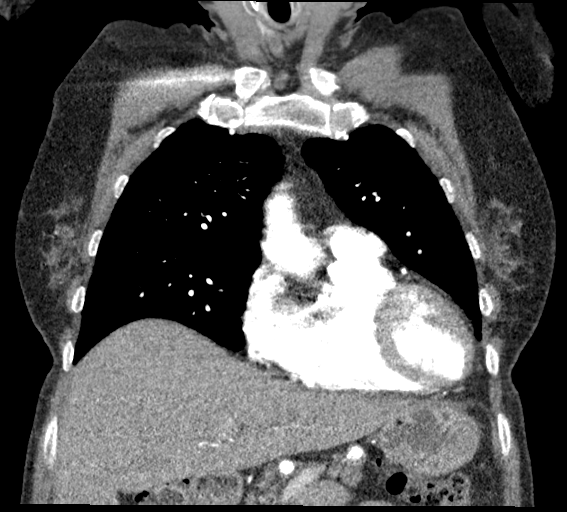

[18 of 46 positions shown; findings below may reference images not displayed]

RADIATION DOSE REDUCTION: This exam was performed according to the
departmental dose-optimization program which includes automated
exposure control, adjustment of the mA and/or kV according to
patient size and/or use of iterative reconstruction technique.

CONTRAST:  100mL OMNIPAQUE IOHEXOL 350 MG/ML SOLN
FINDINGS: Cardiovascular: Heart size is mildly enlarged. No pericardial
effusion. Dense coronary artery calcifications are seen. There is
preferential enhancement of the pulmonary artery.

The ascending aorta measures up to 4.0 x 3.9 cm (transverse by AP)
unchanged when measured in a similar manner on multiple prior CTs
including 05/22/2020.

Mild atherosclerotic calcifications within the aortic arch. No
thoracic aortic aneurysm.

No central pulmonary embolism is seen.

Mediastinum/Nodes: No axillary, mediastinal or hilar pathologically
enlarged lymph nodes by CT criteria. There are subcentimeter short
axis bilateral hilar lymph nodes. The visualized thyroid and
esophagus are grossly unremarkable.

Lungs/Pleura: The central airways are patent. Moderate centrilobular
emphysematous changes are seen, predominantly within the lung
apices. Scattered ground-glass subsegmental atelectasis. No pleural
effusion or pneumothorax.

Upper Abdomen: Cholecystectomy clips. Splenule abutting the splenic
hilum.

Musculoskeletal: Mild-to-moderate multilevel degenerative disc
changes of the thoracic spine.

Review of the MIP images confirms the above findings.
IMPRESSION: :
IMPRESSION: 1. Borderline ascending aortic aneurysm measuring up to 4.0 cm in
caliber, unchanged from 05/22/2020. Recommend annual imaging
followup by CTA or MRA. This recommendation follows 6656
ACCF/AHA/AATS/ACR/ASA/SCA/HANIG/TYNESHA/MD ALIM/JOANNIT Guidelines for the
Diagnosis and Management of Patients with Thoracic Aortic Disease.
Circulation. 6656; 121: E266-e369. Aortic aneurysm NOS (5QTTI-AA3.8)
2. Moderate centrilobular emphysema.

Aortic Atherosclerosis (5QTTI-FAE.E) and Emphysema (5QTTI-ECB.K).

Aortic aneurysm NOS (5QTTI-AA3.8).

## 2023-03-20 ENCOUNTER — Other Ambulatory Visit: Payer: Self-pay

## 2023-03-20 MED ORDER — LOSARTAN POTASSIUM 100 MG PO TABS: 100.0000 mg | ORAL_TABLET | Freq: Every day | ORAL | 1 refills | Status: DC

## 2023-03-27 IMAGING — MR MR LUMBAR SPINE WO/W CM
4 of 7 series · 21 of 48 positions shown · IV contrast (gadavist)
Comparison: Lumbar spine MRI 03/05/2013 (report only, images not
available)

CLINICAL DATA: Low back pain, radiating to right leg for 2 months.
History of lumbar spine surgery 16 years ago

EXAM:
MRI LUMBAR SPINE WITHOUT AND WITH CONTRAST
TECHNIQUE: Multiplanar and multiecho pulse sequences of the lumbar spine were
obtained without and with intravenous contrast.
CONTRAST:  7mL GADAVIST GADOBUTROL 1 MMOL/ML IV SOLN

[Series 2: T1 · sagittal · 4.0mm · 0.51mm/px · 5 of 15 slices shown (1 of 2)]
[im 1/15]
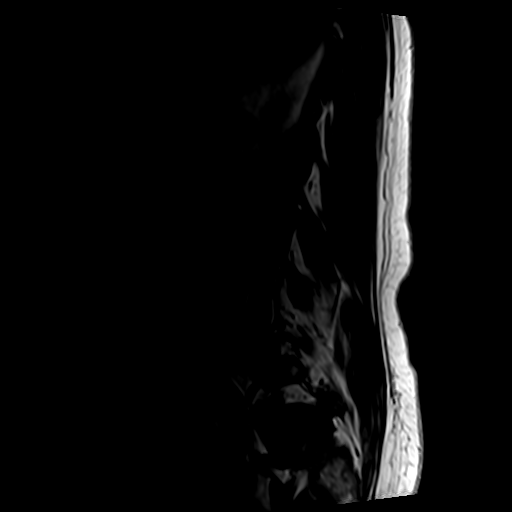
[im 4/15]
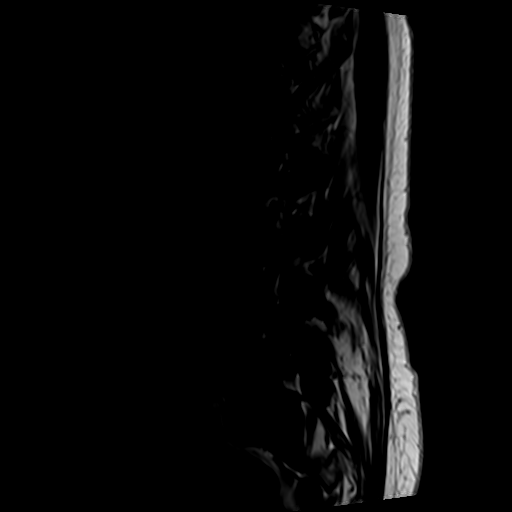
[im 8/15]
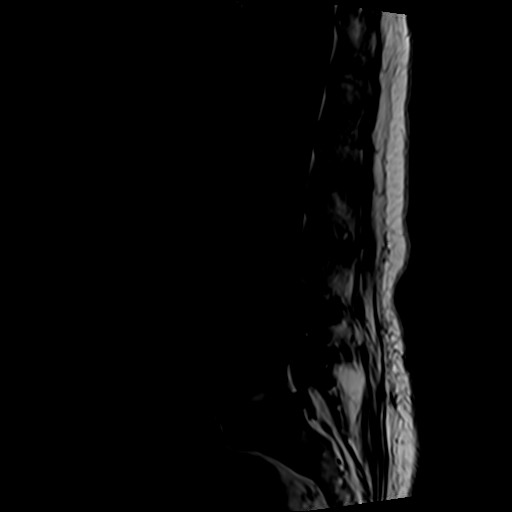
[im 11/15]
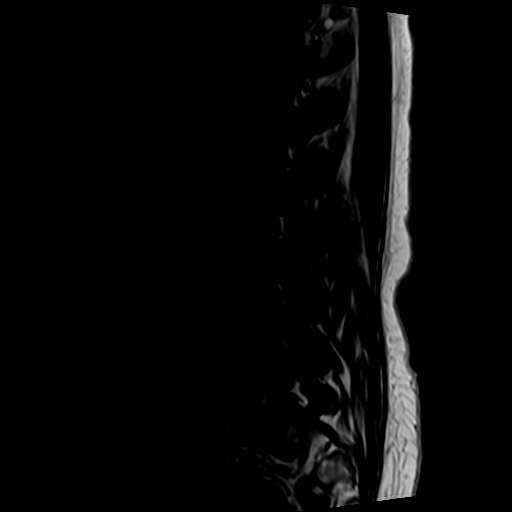
[im 15/15]
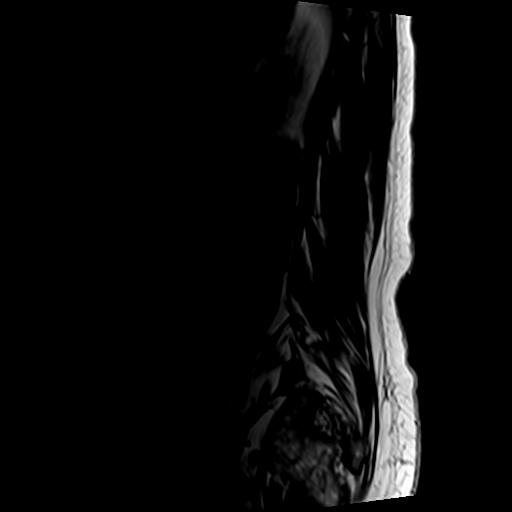

[Series 4: T2 · axial · 4.0mm · 0.78mm/px · z∈[-185,+9]mm · 8 of 35 slices shown (1 of 2)]
[im 1/35]
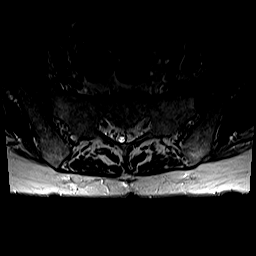
[im 4/35]
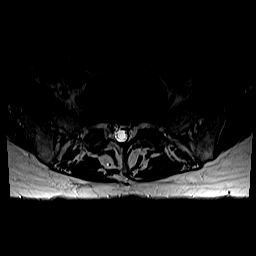
[im 12/35]
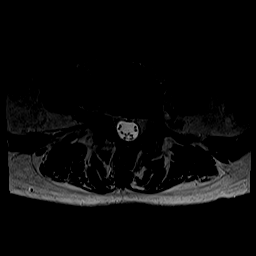
[im 16/35]
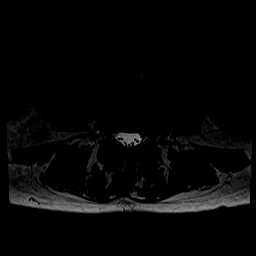
[im 19/35]
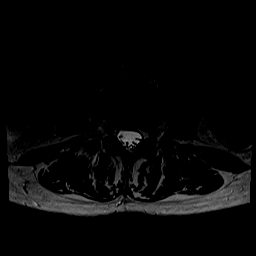
[im 23/35]
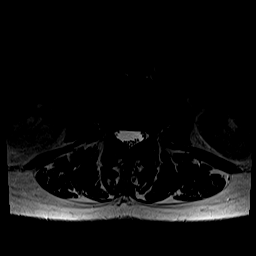
[im 31/35]
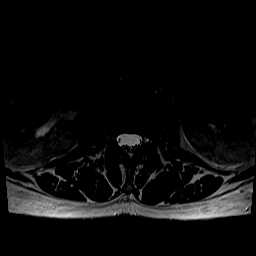
[im 35/35]
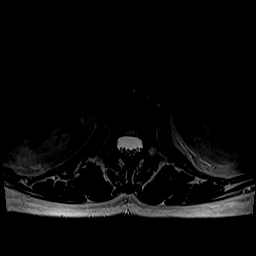

[Series 5: T1 · axial · 4.0mm · 0.39mm/px · z∈[-185,-11]mm · 4 of 35 slices shown (2 of 2)]
[im 1/35]
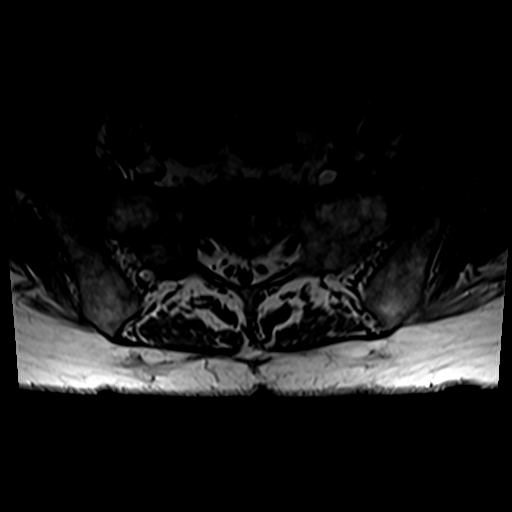
[im 4/35]
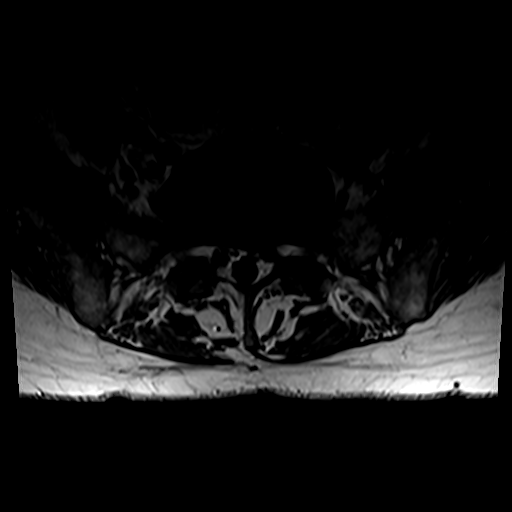
[im 19/35]
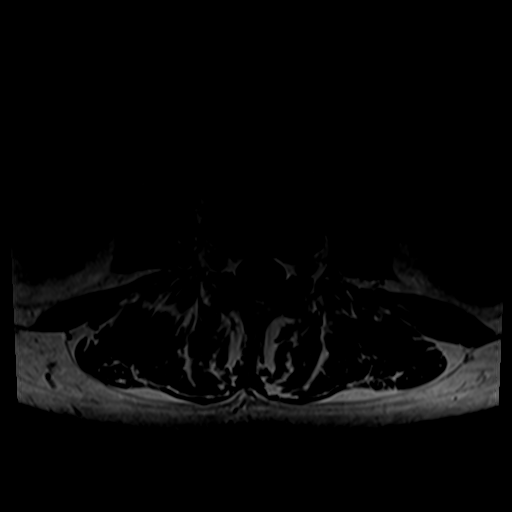
[im 31/35]
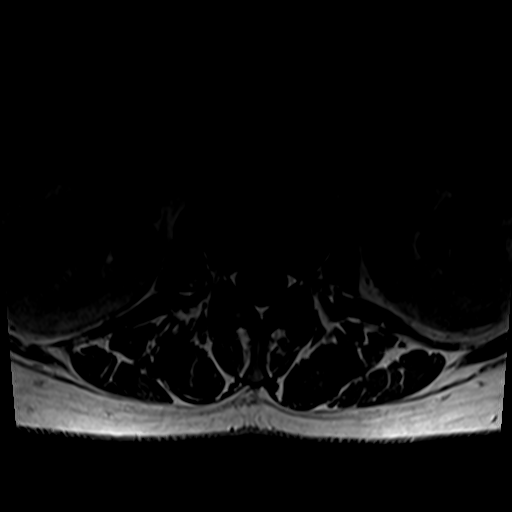

[Series 6: T2 · sagittal · 4.0mm · 0.41mm/px · 4 of 15 slices shown (2 of 2)]
[im 1/15]
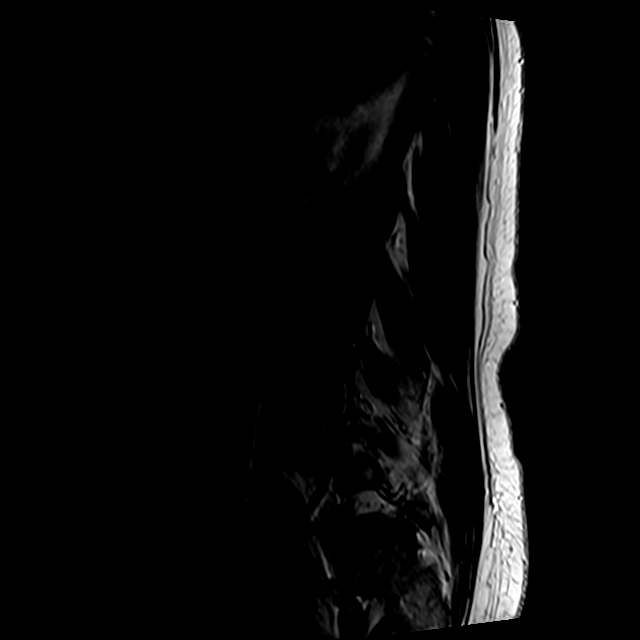
[im 5/15]
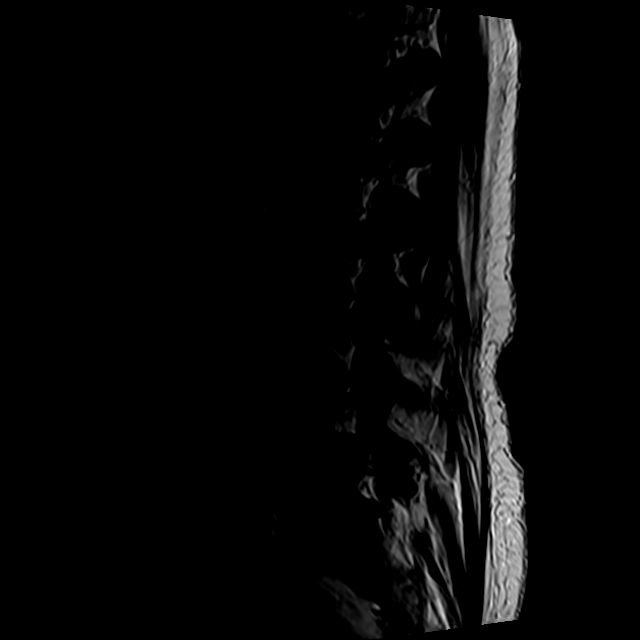
[im 10/15]
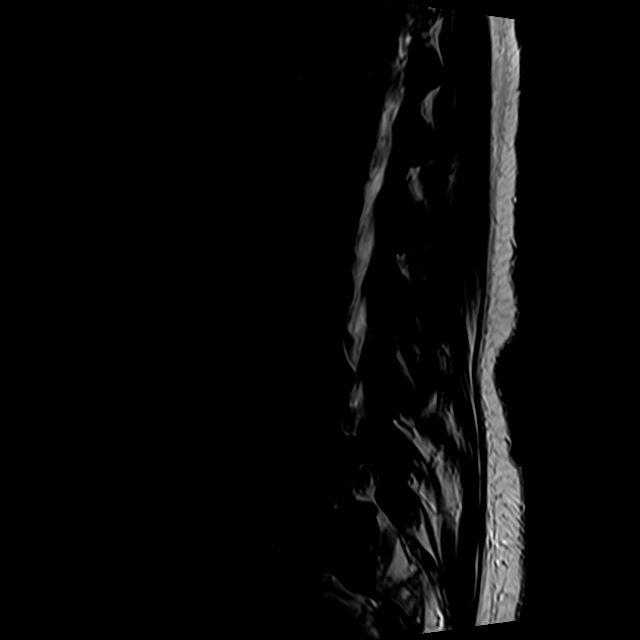
[im 15/15]
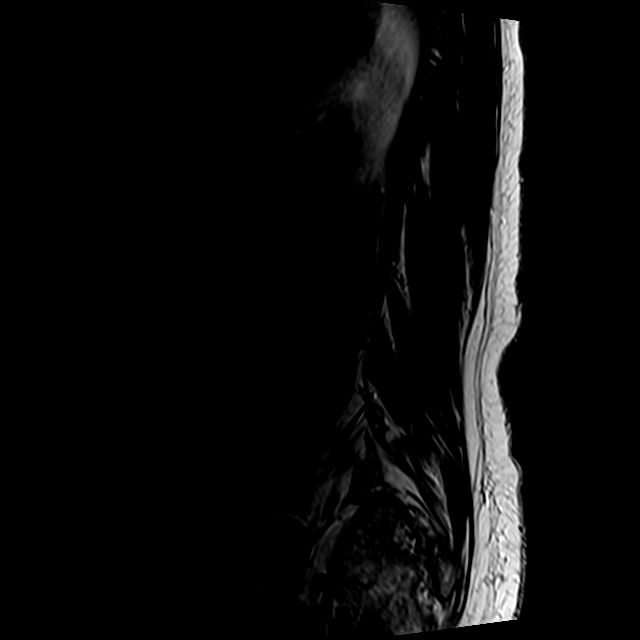

[21 of 48 positions shown; findings below may reference images not displayed]

FINDINGS: Segmentation: Standard; the lowest formed disc space is designated
L5-S1.

Alignment: There is trace grade 1 anterolisthesis of L4 on L5. There
is mild levoscoliosis centered at L3. Alignment is otherwise normal.

Vertebrae: Vertebral body heights are preserved. Background marrow
signal is normal. There is mild degenerative endplate marrow signal
abnormality at L5-S1. There is no suspicious marrow signal
abnormality or marrow edema. There is no abnormal marrow enhancement

Conus medullaris and cauda equina: Conus extends to the T12-L1
level. Conus and cauda equina appear normal. There is no abnormal
enhancement of the cauda equina nerve roots.

Paraspinal and other soft tissues: Unremarkable.

Disc levels:

There is disc desiccation and narrowing most advanced at L4-L5 and
L5-S1. The other disc heights are overall preserved with mild
desiccation.

T12-L1: No significant spinal canal or neural foraminal stenosis

L1-L2: Mild facet arthropathy without significant spinal canal or
neural foraminal stenosis

L2-L3: There is a minimal disc bulge and mild facet arthropathy
without significant spinal canal or neural foraminal stenosis

L3-L4: There is a mild diffuse disc bulge and bilateral facet
arthropathy without significant spinal canal or neural foraminal
stenosis.

L4-L5: The patient is status post right laminotomy. There is a
diffuse disc bulge with right larger than left foraminal components
and right foraminal annular fissure, ligamentum flavum thickening,
and moderate bilateral facet arthropathy resulting in mild spinal
canal stenosis with crowding of the bilateral subarticular zones and
severe right and mild left neural foraminal stenosis with possible
impingement of the exiting right L4 nerve root.

L5-S1: The patient is status post right laminotomy. There is a mild
disc bulge and bilateral facet arthropathy without significant
spinal canal or neural foraminal stenosis.
IMPRESSION: 1. Status post right laminotomy at L4-L5 and L5-S1. There is a
diffuse disc bulge and bilateral facet arthropathy at L4-L5
resulting in severe right and mild left neural foraminal stenosis
with possible impingement of the exiting right L4 nerve root. There
is also crowding of the subarticular zones without evidence of
impingement of the traversing nerve roots.
2. No other significant spinal canal or neural foraminal stenosis or
other evidence of nerve root impingement.

## 2023-04-12 DIAGNOSIS — Z23 Encounter for immunization: Secondary | ICD-10-CM | POA: Diagnosis not present

## 2023-04-21 ENCOUNTER — Telehealth: Payer: Self-pay | Admitting: Family Medicine

## 2023-04-21 MED ORDER — HYDROCHLOROTHIAZIDE 50 MG PO TABS: ORAL_TABLET | ORAL | 1 refills | Status: DC

## 2023-04-21 NOTE — Telephone Encounter (Signed)
  Prescription Request  04/21/2023  LOV: 09/17/2022 Patient is scheduled for 10/9, however she is completely out of medications  What is the name of the medication or equipment? hydrochlorothiazide (HYDRODIURIL) 50 MG tablet   Have you contacted your pharmacy to request a refill? Yes   Which pharmacy would you like this sent to?  [id number] - [place], [place] - [address] [place] [address] Phone: [phone] Fax: [phone]    Patient notified that their request is being sent to the clinical staff for review and that they should receive a response within 2 business days.   Please advise at Mobile [phone] (mobile)

## 2023-04-21 NOTE — Telephone Encounter (Signed)
Refill sent.

## 2023-04-23 ENCOUNTER — Encounter: Payer: Self-pay | Admitting: Family Medicine

## 2023-04-23 ENCOUNTER — Ambulatory Visit (INDEPENDENT_AMBULATORY_CARE_PROVIDER_SITE_OTHER): Payer: Medicare Other | Admitting: Family Medicine

## 2023-04-23 VITALS — BP 138/88 | HR 113 | Temp 98.3°F | Wt 151.4 lb

## 2023-04-23 DIAGNOSIS — I10 Essential (primary) hypertension: Secondary | ICD-10-CM

## 2023-04-23 DIAGNOSIS — Z5320 Procedure and treatment not carried out because of patient's decision for unspecified reasons: Secondary | ICD-10-CM

## 2023-04-23 DIAGNOSIS — E782 Mixed hyperlipidemia: Secondary | ICD-10-CM

## 2023-04-23 DIAGNOSIS — Z8673 Personal history of transient ischemic attack (TIA), and cerebral infarction without residual deficits: Secondary | ICD-10-CM | POA: Diagnosis not present

## 2023-04-23 DIAGNOSIS — I4892 Unspecified atrial flutter: Secondary | ICD-10-CM | POA: Diagnosis not present

## 2023-04-23 LAB — COMPREHENSIVE METABOLIC PANEL
ALT: 15 U/L (ref 0–35)
AST: 18 U/L (ref 0–37)
Albumin: 4.1 g/dL (ref 3.5–5.2)
Alkaline Phosphatase: 69 U/L (ref 39–117)
BUN: 27 mg/dL — ABNORMAL HIGH (ref 6–23)
CO2: 30 meq/L (ref 19–32)
Calcium: 9.5 mg/dL (ref 8.4–10.5)
Chloride: 101 meq/L (ref 96–112)
Creatinine, Ser: 0.52 mg/dL (ref 0.40–1.20)
GFR: 91.94 mL/min (ref >60.00)
Glucose, Bld: 141 mg/dL — ABNORMAL HIGH (ref 70–99)
Potassium: 3.3 meq/L — ABNORMAL LOW (ref 3.5–5.1)
Sodium: 138 meq/L (ref 135–145)
Total Bilirubin: 0.8 mg/dL (ref 0.2–1.2)
Total Protein: 6.2 g/dL (ref 6.0–8.3)

## 2023-04-23 LAB — LDL CHOLESTEROL, DIRECT: Direct LDL: 74 mg/dL

## 2023-04-23 MED ORDER — EZETIMIBE 10 MG PO TABS: 10.0000 mg | ORAL_TABLET | Freq: Every day | ORAL | 3 refills | Status: DC

## 2023-04-23 MED ORDER — METOPROLOL SUCCINATE ER 25 MG PO TB24: 25.0000 mg | ORAL_TABLET | Freq: Every day | ORAL | 3 refills | Status: DC

## 2023-04-23 MED ORDER — HYDROCHLOROTHIAZIDE 50 MG PO TABS: ORAL_TABLET | ORAL | 1 refills | Status: DC

## 2023-04-23 MED ORDER — LOSARTAN POTASSIUM 100 MG PO TABS: 100.0000 mg | ORAL_TABLET | Freq: Every day | ORAL | 1 refills | Status: DC

## 2023-04-23 NOTE — Patient Instructions (Addendum)

## 2023-04-23 NOTE — Progress Notes (Signed)
/   [name] , 1951-06-23, 72 y.o., female MRN: [id number] Patient Care Team    Relationship Specialty Notifications Start End  Natalia Leatherwood, DO PCP - General Family Medicine  01/04/16   Vivi Barrack, DPM Consulting Physician Podiatry  01/12/16   Coletta Memos, MD Consulting Physician Neurosurgery  10/03/21   [place], Peter M, MD Consulting Physician Cardiology  04/23/23   Alfredo Martinez, MD Consulting Physician Urology  04/23/23   Carollee Massed, MD Referring Physician Ophthalmology  04/23/23   Alleen Borne, MD Consulting Physician Cardiothoracic Surgery  04/23/23     Chief Complaint  Patient presents with   Hypertension    Subjective: Pt presents for routine OV follow up on Chronic Conditions/illness Management Hypertension/morbid obesity/hyperlipidemia/TIA: Pt reports compliance with Cozaar 100  every day and HCTZ 50 qd.  Patient denies chest pain, shortness of breath, dizziness or lower extremity edema.  Exercise: routine exercise.  RF: Hypertension, history of TIA, smoker, family history of heart disease Patient had been on metoprolol and Eliquis for suspected atrial flutter, when seen by another provider for chest discomfort.  Cardiology did not feel EKG tracing was consistent with atrial flutter and discontinued metoprolol and Eliquis.  Patient was encouraged to restart baby aspirin for her history of TIA.      12/04/2022    1:10 PM 09/05/2022   10:22 AM 07/01/2022   10:33 AM 09/24/2021   10:12 AM 04/09/2021    9:41 AM  Depression screen PHQ 2/9  Decreased Interest 0 0 0 0 0  Down, Depressed, Hopeless 0 0 0 0 0  PHQ - 2 Score 0 0 0 0 0      01/08/2017    3:43 PM  GAD 7 : Generalized Anxiety Score  Nervous, Anxious, on Edge 2  Control/stop worrying 2  Worry too much - different things 0  Trouble relaxing 0  Restless 0  Easily annoyed or irritable 0  Afraid - awful might happen 0  Total GAD 7 Score 4  Anxiety Difficulty Very difficult    Allergies   Allergen Reactions   Sulfa Antibiotics    Social History   Tobacco Use   Smoking status: Every Day    Current packs/day: 1.00    Average packs/day: 1 pack/day for 3.0 years (3.0 ttl pk-yrs)    Types: Cigarettes   Smokeless tobacco: Never  Substance Use Topics   Alcohol use: No    Alcohol/week: 0.0 standard drinks of alcohol   Past Medical History:  Diagnosis Date   Allergic rhinitis    Astigmatism    right eye   Back pain    Dr. Jennet Maduro ([facility])   Basal cell carcinoma    Closed fracture of fifth metatarsal bone of left foot 04/12/2022   Combined form of age-related cataract, both eyes    Combined forms of age-related cataract of right eye 06/03/2019   Cystocele    Demyelinating disease (HCC) 2003   No definitive workup completed; prior records   Depression    Disorder of refraction    both eyes   Early stage nonexudative age-related macular degeneration of both eyes    Exophoria    Fibrocystic breast    GERD (gastroesophageal reflux disease)    History of echocardiogram 12/2012   Dr. 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   Posterior vitreous detachment, both eyes    Postmenopausal    Pseudophakia of left eye    Psoriasis 2009   Regular astigmatism, right eye 06/03/2019   S/P lumbar discectomy 09/24/2021   Sciatica of left side 05/2013   TIA (transient ischemic attack) 2000   Urinary incontinence    Past Surgical History:  Procedure Laterality Date   CHOLECYSTECTOMY  11/2001   LUMBAR DISC SURGERY  04/2005   R L4-5  and L5-S1   SKIN BIOPSY Left 1999   left hand bcc vs scc   TONSILLECTOMY AND ADENOIDECTOMY     Family History  Problem Relation Age of Onset   Rheum arthritis Mother    Alcohol abuse Mother    Heart disease  Father 41       MI at 11; died   Diabetes Father    Dementia Maternal Grandmother    Allergies as of 04/23/2023       Reactions   Sulfa Antibiotics         Medication List        Accurate as of April 23, 2023  1:17 PM. If you have any questions, ask your nurse or doctor.          aspirin EC 81 MG tablet Take 1 tablet (81 mg total) by mouth daily. Swallow whole.   ezetimibe 10 MG tablet Commonly known as: ZETIA Take 1 tablet (10 mg total) by mouth daily.   GEMTESA PO Take by mouth.   hydrochlorothiazide 50 MG tablet Commonly known as: HYDRODIURIL TAKE 1 TABLET(50 MG) BY MOUTH DAILY   losartan 100 MG tablet Commonly known as: COZAAR Take 1 tablet (100 mg total) by mouth daily.   metoprolol succinate 25 MG 24 hr tablet Commonly known as: TOPROL-XL Take 1 tablet (25 mg total) by mouth daily. Started by: Felix Pacini   MULTIVITAMIN ADULT PO Take 1 Dose by mouth.        No results found for this or any previous visit (from the past 24 hour(s)).   No results found.   ROS: Negative, with the exception of above mentioned in HPI ROS  Objective:  BP 138/88   Pulse (!) 113   Temp 98.3 F (36.8 C)   Wt 151 lb 6.4 oz (68.7 kg)   SpO2 95%   BMI 25.99 kg/m  Body mass index is 25.99 kg/m. Physical Exam Vitals and nursing note reviewed.  Constitutional:      General: She is not in acute distress.    Appearance: Normal appearance. She is not ill-appearing, toxic-appearing or diaphoretic.  HENT:     Head: Normocephalic and atraumatic.  Eyes:     General: No scleral icterus.       Right eye: No discharge.        Left eye: No discharge.     Extraocular Movements: Extraocular movements intact.     Conjunctiva/sclera: Conjunctivae normal.     Pupils: Pupils are equal, round, and reactive to light.  Cardiovascular:     Rate and Rhythm: Normal rate and regular rhythm.  Pulmonary:     Effort: Pulmonary effort is normal. No respiratory distress.      Breath sounds: Normal breath sounds. No wheezing, rhonchi or rales.  Musculoskeletal:     Right lower leg: No edema.     Left lower leg: No edema.  Skin:    General: Skin is warm.     Findings: No  rash.  Neurological:     Mental Status: She is alert and oriented to person, place, and time. Mental status is at baseline.     Motor: No weakness.     Gait: Gait normal.  Psychiatric:        Mood and Affect: Mood normal.        Behavior: Behavior normal.        Thought Content: Thought content normal.        Judgment: Judgment normal.     No results found for this or any previous visit (from the past 24 hour(s)).    Assessment/Plan: RICHIE VADALA is a 72 y.o. female present for OV for Chronic Conditions/illness Management Essential hypertension, benign/morbid obesity/hyperlipidemia/history of TIA/long-term current med use Stable Restart metoprolol 25 XL- tachycardia and BP borderline.  Continue losartan 100 QD Continue HCTZ 50 mg daily. - continue ASA 81 - low sodium, exercise.  CMP and LDL - Follow-up 5.5 months   Return in about 24 weeks (around 10/08/2023) for Routine chronic condition follow-up.  Orders Placed This Encounter  Procedures   Direct LDL   Comp Met (CMET)    Meds ordered this encounter  Medications   hydrochlorothiazide (HYDRODIURIL) 50 MG tablet    Sig: TAKE 1 TABLET(50 MG) BY MOUTH DAILY    Dispense:  90 tablet    Refill:  1   losartan (COZAAR) 100 MG tablet    Sig: Take 1 tablet (100 mg total) by mouth daily.    Dispense:  90 tablet    Refill:  1   ezetimibe (ZETIA) 10 MG tablet    Sig: Take 1 tablet (10 mg total) by mouth daily.    Dispense:  90 tablet    Refill:  3   metoprolol succinate (TOPROL-XL) 25 MG 24 hr tablet    Sig: Take 1 tablet (25 mg total) by mouth daily.    Dispense:  90 tablet    Refill:  3       electronically signed by:  Felix Pacini, DO  [facility] - [place]

## 2023-04-28 ENCOUNTER — Telehealth: Payer: Self-pay | Admitting: Family Medicine

## 2023-04-28 MED ORDER — POTASSIUM CHLORIDE CRYS ER 20 MEQ PO TBCR: 20.0000 meq | EXTENDED_RELEASE_TABLET | Freq: Every day | ORAL | 3 refills | Status: DC

## 2023-04-28 NOTE — Telephone Encounter (Signed)
These call patient Her kidney function is normal. Her LDL/bad cholesterol is excellent at 74. Her potassium is low and she will need to supplement potassium daily as long as she is on the hydrochlorothiazide for her blood pressure.  Hydrochlorothiazide and other diuretics can cause a decrease in potassium..  I have called in the potassium supplement for her.

## 2023-04-28 NOTE — Telephone Encounter (Signed)
Pt advised of results and new meds. Pt is out of town for 2 weeks and will pick of potassium from pharmacy when she returns.

## 2023-06-16 DIAGNOSIS — N3946 Mixed incontinence: Secondary | ICD-10-CM | POA: Diagnosis not present

## 2023-06-26 DIAGNOSIS — N3946 Mixed incontinence: Secondary | ICD-10-CM | POA: Diagnosis not present

## 2023-07-14 DIAGNOSIS — R3914 Feeling of incomplete bladder emptying: Secondary | ICD-10-CM | POA: Diagnosis not present

## 2023-07-14 DIAGNOSIS — R350 Frequency of micturition: Secondary | ICD-10-CM | POA: Diagnosis not present

## 2023-07-14 DIAGNOSIS — N3946 Mixed incontinence: Secondary | ICD-10-CM | POA: Diagnosis not present

## 2023-08-11 ENCOUNTER — Ambulatory Visit: Payer: Medicare Other

## 2023-08-11 DIAGNOSIS — F17210 Nicotine dependence, cigarettes, uncomplicated: Secondary | ICD-10-CM

## 2023-08-11 DIAGNOSIS — Z87891 Personal history of nicotine dependence: Secondary | ICD-10-CM

## 2023-08-11 DIAGNOSIS — Z122 Encounter for screening for malignant neoplasm of respiratory organs: Secondary | ICD-10-CM

## 2023-08-15 DIAGNOSIS — N3946 Mixed incontinence: Secondary | ICD-10-CM | POA: Diagnosis not present

## 2023-08-15 DIAGNOSIS — R3914 Feeling of incomplete bladder emptying: Secondary | ICD-10-CM | POA: Diagnosis not present

## 2023-08-20 ENCOUNTER — Other Ambulatory Visit: Payer: Self-pay

## 2023-08-20 DIAGNOSIS — F17210 Nicotine dependence, cigarettes, uncomplicated: Secondary | ICD-10-CM

## 2023-08-20 DIAGNOSIS — Z122 Encounter for screening for malignant neoplasm of respiratory organs: Secondary | ICD-10-CM

## 2023-08-20 DIAGNOSIS — Z87891 Personal history of nicotine dependence: Secondary | ICD-10-CM

## 2023-09-15 ENCOUNTER — Ambulatory Visit: Payer: Self-pay | Admitting: Family Medicine

## 2023-09-15 NOTE — Telephone Encounter (Signed)
 Pt was contacted and confirmed appt time/date. She has a scheduled appt for 3/26 @ 11

## 2023-09-15 NOTE — Telephone Encounter (Signed)
 Copied from CRM [id number]. Topic: Clinical - Red Word Triage >> Sep 15, 2023  8:43 AM Sim Boast F wrote: Red Word that prompted transfer to Nurse Triage: Patient has bad cough and bloody mucus    Chief Complaint: Cough Symptoms: Cough, bloody mucus, chest rattling with cough Frequency: Ongoing since Friday Pertinent Negatives: Patient denies fever Disposition: [] ED /[] Urgent Care (no appt availability in office) / [x] Appointment(In office/virtual)/ []  [facility]/ [] Home Care/ [] Refused Recommended Disposition /[] [facility]/ []  Follow-up with PCP Additional Notes: Patient stated that she is having cough since Friday.  She is also having bloody mucus from her nose. She is taking  Mucinex and using a Vaporizer but nothing is helping. Patient stated that she used Tessalon Pearls in the past and it was effective. Patient has been scheduled for tomorrow. Patient stated that she can see an upcoming appointment for March 6 with Dr. Claiborne Billings. Unable to see this appointment on my end. I informed patient that she has also has an appointment on March 26. Please follow up with patient to verify if she has something scheduled for March 6.    Reason for Disposition  [1] Continuous (nonstop) coughing interferes with work or school AND [2] no improvement using cough treatment per Care Advice  Answer Assessment - Initial Assessment Questions 1. ONSET: "When did the cough begin?"      Friday   2. SEVERITY: "How bad is the cough today?"      Moderate   3. SPUTUM: "Describe the color of your sputum" (none, dry cough; clear, white, yellow, green)     None today  4. HEMOPTYSIS: "Are you coughing up any blood?" If so ask: "How much?" (flecks, streaks, tablespoons, etc.)     N/A  5. DIFFICULTY BREATHING: "Are you having difficulty breathing?" If Yes, ask: "How bad is it?" (e.g., mild, moderate, severe)    - MILD: No SOB at rest, mild SOB with walking, speaks normally in sentences, can lie  down, no retractions, pulse < 100.    - MODERATE: SOB at rest, SOB with minimal exertion and prefers to sit, cannot lie down flat, speaks in phrases, mild retractions, audible wheezing, pulse 100-120.    - SEVERE: Very SOB at rest, speaks in single words, struggling to breathe, sitting hunched forward, retractions, pulse > 120      No  6. FEVER: "Do you have a fever?" If Yes, ask: "What is your temperature, how was it measured, and when did it start?"     No  7. OTHER SYMPTOMS: "Do you have any other symptoms?" (e.g., runny nose, wheezing, chest pain)       Rattling in chest, bloody nasal discharge when blowing nose  Protocols used: Cough - Acute Non-Productive-A-AH

## 2023-09-16 ENCOUNTER — Encounter: Payer: Self-pay | Admitting: Family Medicine

## 2023-09-16 ENCOUNTER — Ambulatory Visit (INDEPENDENT_AMBULATORY_CARE_PROVIDER_SITE_OTHER): Admitting: Family Medicine

## 2023-09-16 VITALS — BP 138/85 | HR 67 | Ht 64.0 in | Wt 156.2 lb

## 2023-09-16 DIAGNOSIS — J441 Chronic obstructive pulmonary disease with (acute) exacerbation: Secondary | ICD-10-CM | POA: Diagnosis not present

## 2023-09-16 DIAGNOSIS — R062 Wheezing: Secondary | ICD-10-CM | POA: Diagnosis not present

## 2023-09-16 DIAGNOSIS — J439 Emphysema, unspecified: Secondary | ICD-10-CM | POA: Insufficient documentation

## 2023-09-16 DIAGNOSIS — R051 Acute cough: Secondary | ICD-10-CM | POA: Diagnosis not present

## 2023-09-16 DIAGNOSIS — J432 Centrilobular emphysema: Secondary | ICD-10-CM

## 2023-09-16 LAB — POC INFLUENZA A&B (BINAX/QUICKVUE)
Influenza A, POC: NEGATIVE
Influenza B, POC: NEGATIVE

## 2023-09-16 LAB — POC COVID19 BINAXNOW: SARS Coronavirus 2 Ag: NEGATIVE

## 2023-09-16 MED ORDER — BENZONATATE 200 MG PO CAPS
200.0000 mg | ORAL_CAPSULE | Freq: Two times a day (BID) | ORAL | 0 refills | Status: DC | PRN
Start: 2023-09-16 — End: 2023-10-01

## 2023-09-16 MED ORDER — PREDNISONE 20 MG PO TABS: ORAL_TABLET | ORAL | 0 refills | Status: DC

## 2023-09-16 MED ORDER — ALBUTEROL SULFATE HFA 108 (90 BASE) MCG/ACT IN AERS: 2.0000 | INHALATION_SPRAY | Freq: Four times a day (QID) | RESPIRATORY_TRACT | 3 refills | Status: DC | PRN

## 2023-09-16 MED ORDER — IPRATROPIUM-ALBUTEROL 0.5-2.5 (3) MG/3ML IN SOLN
3.0000 mL | Freq: Once | RESPIRATORY_TRACT | Status: AC
Start: 2023-09-16 — End: 2023-09-16
  Administered 2023-09-16: 3 mL via RESPIRATORY_TRACT

## 2023-09-16 MED ORDER — AMOXICILLIN-POT CLAVULANATE 875-125 MG PO TABS: 1.0000 | ORAL_TABLET | Freq: Two times a day (BID) | ORAL | 0 refills | Status: DC

## 2023-09-16 NOTE — Patient Instructions (Addendum)
 Return in about 2 weeks (around 09/30/2023), or if symptoms worsen or fail to improve.        Great to see you today.  I have refilled the medication(s) we provide.   If labs were collected or images ordered, we will inform you of  results once we have received them and reviewed. We will contact you either by echart message, or telephone call.  Please give ample time to the testing facility, and our office to run,  receive and review results. Please do not call inquiring of results, even if you can see them in your chart. We will contact you as soon as we are able. If it has been over 1 week since the test was completed, and you have not yet heard from [place], then please call us.    - echart message- for normal results that have been seen by the patient already.   - telephone call: abnormal results or if patient has not viewed results in their echart.  If a referral to a specialist was entered for you, please call us in 2 weeks if you have not heard from the specialist office to schedule.

## 2023-09-16 NOTE — Progress Notes (Signed)
 [name] , Jul 17, 1950, 73 y.o., female MRN: [id number] Patient Care Team    Relationship Specialty Notifications Start End  Natalia Leatherwood, DO PCP - General Family Medicine  01/04/16   Vivi Barrack, DPM Consulting Physician Podiatry  01/12/16   Coletta Memos, MD Consulting Physician Neurosurgery  10/03/21   [place], Peter M, MD Consulting Physician Cardiology  04/23/23   Alfredo Martinez, MD Consulting Physician Urology  04/23/23   Carollee Massed, MD Referring Physician Ophthalmology  04/23/23   Alleen Borne, MD Consulting Physician Cardiothoracic Surgery  04/23/23     Chief Complaint  Patient presents with   Cough    Productive x 3-4 d; yellow/white phlegm and some blood when blowing nose. Took Mucinex DM, last dose yesterday.     Subjective: [name] is a 73 y.o. Pt presents for an OV with complaints of cough, nasal congestion, and increased phlegm production of 5 days duration.  Associated symptoms include deep cough with blood tinged mucous present.  Pt is a smoker. Travels frequently.  Pt has tried mucinex to ease their symptoms.      09/16/2023    9:50 AM 12/04/2022    1:10 PM 09/05/2022   10:22 AM 07/01/2022   10:33 AM 09/24/2021   10:12 AM  Depression screen PHQ 2/9  Decreased Interest 0 0 0 0 0  Down, Depressed, Hopeless 0 0 0 0 0  PHQ - 2 Score 0 0 0 0 0    Allergies  Allergen Reactions   Sulfa Antibiotics    Social History   Social History Narrative   Widower 2018 (Damon). 2 sons.   College. Retired.    Former smoker (quit 04/2005- 30+ pack year), occasional etoh, no drugs   Drinks caffeine, uses herbal remedies, daily vitamin use   Wears her seatbelt, wears her bicycle helmet   Exercises routinely   Smoke detector in the home.    Firearms in the home (locked)   Feels safe in her relationships.    Past Medical History:  Diagnosis Date   Allergic rhinitis    Astigmatism    right eye   Back pain    Dr. Jennet Maduro ([facility])   Basal cell carcinoma    Closed fracture of fifth metatarsal bone of left foot 04/12/2022   Combined form of age-related cataract, both eyes    Combined forms of age-related cataract of right eye 06/03/2019   Cystocele    Demyelinating disease (HCC) 2003   No definitive workup completed; prior records   Depression    Disorder of refraction    both eyes   Early stage nonexudative age-related macular degeneration of both eyes    Exophoria    Fibrocystic breast    GERD (gastroesophageal reflux disease)    History of echocardiogram 12/2012   Dr. 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   Posterior vitreous detachment, both eyes    Postmenopausal    Pseudophakia of left eye    Psoriasis 2009   Regular astigmatism, right eye 06/03/2019   S/P lumbar discectomy 09/24/2021   Sciatica of left side 05/2013   TIA (transient ischemic attack) 2000   Urinary incontinence    Past Surgical History:  Procedure Laterality Date   CHOLECYSTECTOMY  11/2001   LUMBAR DISC SURGERY  04/2005   R L4-5  and L5-S1   SKIN BIOPSY Left 1999   left hand bcc vs scc   TONSILLECTOMY AND ADENOIDECTOMY     Family History  Problem Relation Age of Onset   Rheum arthritis Mother    Alcohol abuse Mother    Heart disease Father 54       MI at 8; died   Diabetes Father    Dementia Maternal Grandmother    Allergies as of 09/16/2023       Reactions   Sulfa Antibiotics         Medication List        Accurate as of September 16, 2023 10:38 AM. If you have any questions, ask your nurse or doctor.          albuterol 108 (90 Base) MCG/ACT inhaler Commonly known as: VENTOLIN HFA Inhale 2 puffs into the lungs every 6 (six) hours as needed for wheezing or shortness of breath. Started  by: Felix Pacini   amoxicillin-clavulanate 875-125 MG tablet Commonly known as: AUGMENTIN Take 1 tablet by mouth 2 (two) times daily. Started by: Felix Pacini   aspirin EC 81 MG tablet Take 1 tablet (81 mg total) by mouth daily. Swallow whole.   benzonatate 200 MG capsule Commonly known as: TESSALON Take 1 capsule (200 mg total) by mouth 2 (two) times daily as needed for cough. Started by: Felix Pacini   ezetimibe 10 MG tablet Commonly known as: ZETIA Take 1 tablet (10 mg total) by mouth daily.   GEMTESA PO Take by mouth.   hydrochlorothiazide 50 MG tablet Commonly known as: HYDRODIURIL TAKE 1 TABLET(50 MG) BY MOUTH DAILY   losartan 100 MG tablet Commonly known as: COZAAR Take 1 tablet (100 mg total) by mouth daily.   metoprolol succinate 25 MG 24 hr tablet Commonly known as: TOPROL-XL Take 1 tablet (25 mg total) by mouth daily.   MULTIVITAMIN ADULT PO Take 1 Dose by mouth.   potassium chloride SA 20 MEQ tablet Commonly known as: KLOR-CON M Take 1 tablet (20 mEq total) by mouth daily.   predniSONE 20 MG tablet Commonly known as: DELTASONE 60 mg x2d, 40 mg x4d, 20 mg x3d, 10 mg x2d Started by: Felix Pacini        All past medical history, surgical history, allergies, family history, immunizations andmedications were updated in the EMR today and reviewed under the history and medication portions of their EMR.     ROS Negative, with the exception of above mentioned in HPI   Objective:  BP 138/85   Pulse 67   Ht 5\' 4"  (1.626 m)   Wt 156 lb 3.2 oz (70.9 kg)   SpO2 95%   BMI 26.81 kg/m  Body mass index is 26.81 kg/m. Physical Exam Vitals and nursing note reviewed.  Constitutional:      General: She is not in acute distress.    Appearance: Normal appearance. She is normal weight. She is not ill-appearing or toxic-appearing.  HENT:     Head: Normocephalic and atraumatic.     Right Ear: Tympanic membrane, ear canal and external ear normal.     Left  Ear: Tympanic membrane, ear canal and external ear normal.     Nose: Congestion and rhinorrhea present.     Mouth/Throat:     Mouth: Mucous membranes are moist.     Pharynx: No oropharyngeal exudate or posterior oropharyngeal erythema.  Eyes:  General: No scleral icterus.       Right eye: No discharge.        Left eye: No discharge.     Extraocular Movements: Extraocular movements intact.     Conjunctiva/sclera: Conjunctivae normal.     Pupils: Pupils are equal, round, and reactive to light.  Cardiovascular:     Rate and Rhythm: Normal rate and regular rhythm.  Pulmonary:     Effort: Pulmonary effort is normal. No respiratory distress.     Breath sounds: No stridor. Wheezing and rhonchi present. No rales.     Comments: Cough present Musculoskeletal:     Cervical back: Neck supple. No tenderness.  Lymphadenopathy:     Cervical: No cervical adenopathy.  Skin:    Findings: No rash.  Neurological:     Mental Status: She is alert and oriented to person, place, and time. Mental status is at baseline.     Motor: No weakness.     Coordination: Coordination normal.     Gait: Gait normal.  Psychiatric:        Mood and Affect: Mood normal.        Behavior: Behavior normal.        Thought Content: Thought content normal.        Judgment: Judgment normal.   97.30F temp  No results found. No results found. Results for orders placed or performed in visit on 09/16/23 (from the past 24 hours)  POC COVID-19 BinaxNow     Status: None   Collection Time: 09/16/23 10:27 AM  Result Value Ref Range   SARS Coronavirus 2 Ag Negative Negative  POC Influenza A&B (Binax test)     Status: None   Collection Time: 09/16/23 10:28 AM  Result Value Ref Range   Influenza A, POC Negative Negative   Influenza B, POC Negative Negative    Assessment/Plan: [name] is a 73 y.o. female present for OV for  Acute cough/wheezing/COPD with acute exacerbation (HCC) (Primary)/Centrilobular emphysema  (HCC) - POC COVID-19 BinaxNow>neg - POC Influenza A&B (Binax test)>Neg Rest, hydrate.  mucinex (DM if cough), nettie pot or nasal saline.  augmentin prescribed, take until completed.  Tessalon perles Prednisone taper Albuterol inhaler prescribed, with illness ok to use 2 puffs q 6 hrs prn.   - if requiring more than 2 x weekly when not sick, would consider daily inhaler/pulm referral for eval Duoneb trx provided today> wheezing and air movement improved If cough present it can last up to 6-8 weeks.  F/U 2 weeks of not improved.   Reviewed expectations re: course of current medical issues. Discussed self-management of symptoms. Outlined signs and symptoms indicating need for more acute intervention. Patient verbalized understanding and all questions were answered. Patient received an After-Visit Summary.    Orders Placed This Encounter  Procedures   POC COVID-19 BinaxNow   POC Influenza A&B (Binax test)   Meds ordered this encounter  Medications   amoxicillin-clavulanate (AUGMENTIN) 875-125 MG tablet    Sig: Take 1 tablet by mouth 2 (two) times daily.    Dispense:  20 tablet    Refill:  0   predniSONE (DELTASONE) 20 MG tablet    Sig: 60 mg x2d, 40 mg x4d, 20 mg x3d, 10 mg x2d    Dispense:  18 tablet    Refill:  0   benzonatate (TESSALON) 200 MG capsule    Sig: Take 1 capsule (200 mg total) by mouth 2 (two) times daily as needed for cough.  Dispense:  30 capsule    Refill:  0   albuterol (VENTOLIN HFA) 108 (90 Base) MCG/ACT inhaler    Sig: Inhale 2 puffs into the lungs every 6 (six) hours as needed for wheezing or shortness of breath.    Dispense:  8 g    Refill:  3   Referral Orders  No referral(s) requested today     Note is dictated utilizing voice recognition software. Although note has been proof read prior to signing, occasional typographical errors still can be missed. If any questions arise, please do not hesitate to call for verification.   electronically  signed by:  Felix Pacini, DO  [facility] - [place]

## 2023-09-16 NOTE — Addendum Note (Signed)
 Addended by: Emi Holes D on: 09/16/2023 11:17 AM   Modules accepted: Orders

## 2023-09-29 NOTE — Telephone Encounter (Signed)
 Pt scheduled

## 2023-09-29 NOTE — Telephone Encounter (Signed)
 Copied from CRM [id number]. Topic: Clinical - Medication Refill >> Sep 29, 2023 10:31 AM Truddie Crumble wrote: Most Recent Primary Care Visit:  Provider: Felix Pacini A  Department: [facility]  Visit Type: ACUTE  Date: 09/16/2023  Medication: amlodipine   Has the patient contacted their pharmacy? Yes (Agent: If no, request that the patient contact the pharmacy for the refill. If patient does not wish to contact the pharmacy document the reason why and proceed with request.) (Agent: If yes, when and what did the pharmacy advise?)  Is this the correct pharmacy for this prescription? Yes If no, delete pharmacy and type the correct one.  This is the patient's preferred pharmacy:  [id number] - [place], [place] - [address] [facility] [address]  [place] [address] Phone: [phone] Fax: [phone]   Has the prescription been filled recently? No  Is the patient out of the medication? Yes  Has the patient been seen for an appointment in the last year OR does the patient have an upcoming appointment? Yes  Can we respond through MyChart? Yes  Agent: Please be advised that Rx refills may take up to 3 business days. We ask that you follow-up with your pharmacy.

## 2023-10-01 ENCOUNTER — Ambulatory Visit: Payer: Self-pay | Admitting: Family Medicine

## 2023-10-01 ENCOUNTER — Ambulatory Visit

## 2023-10-01 ENCOUNTER — Ambulatory Visit (INDEPENDENT_AMBULATORY_CARE_PROVIDER_SITE_OTHER): Admitting: Family Medicine

## 2023-10-01 ENCOUNTER — Encounter: Payer: Self-pay | Admitting: Family Medicine

## 2023-10-01 VITALS — BP 111/62 | HR 76 | Temp 98.5°F | Wt 157.2 lb

## 2023-10-01 DIAGNOSIS — J209 Acute bronchitis, unspecified: Secondary | ICD-10-CM | POA: Diagnosis not present

## 2023-10-01 DIAGNOSIS — R052 Subacute cough: Secondary | ICD-10-CM

## 2023-10-01 DIAGNOSIS — J449 Chronic obstructive pulmonary disease, unspecified: Secondary | ICD-10-CM

## 2023-10-01 DIAGNOSIS — R053 Chronic cough: Secondary | ICD-10-CM | POA: Diagnosis not present

## 2023-10-01 MED ORDER — PREDNISONE 20 MG PO TABS
ORAL_TABLET | ORAL | 0 refills | Status: DC
Start: 2023-10-01 — End: 2023-10-08

## 2023-10-01 MED ORDER — BENZONATATE 200 MG PO CAPS: 200.0000 mg | ORAL_CAPSULE | Freq: Two times a day (BID) | ORAL | 0 refills | Status: DC | PRN

## 2023-10-01 NOTE — Telephone Encounter (Signed)
 noted

## 2023-10-01 NOTE — Progress Notes (Signed)
 OFFICE VISIT  10/01/2023  CC:  Chief Complaint  Patient presents with   Cough    Patient is a 73 y.o. female who presents for worsening cough. She was seen on 09/16/2023 by Dr. Claiborne Billings for a COPD exacerbation.  Prednisone taper, Augmentin, and albuterol prescribed.  Tessalon Perles prescribed.  HPI: Rattly cough persists.  Whitish sputum.  Not thick or green or blood-tinged. Denies classic wheezing.  No shortness of breath or chest pain.  No fever. Her upper respiratory symptoms have significantly improved.  Mild decreased energy but no aches or significant malaise. She finds that Tessalon does help some.  She has no problem finishing the prednisone and Augmentin.  She is a longtime smoker, current.  Past Medical History:  Diagnosis Date   Allergic rhinitis    Astigmatism    right eye   Back pain    Dr. Jennet Maduro ([facility])   Basal cell carcinoma    Closed fracture of fifth metatarsal bone of left foot 04/12/2022   Combined form of age-related cataract, both eyes    Combined forms of age-related cataract of right eye 06/03/2019   Cystocele    Demyelinating disease (HCC) 2003   No definitive workup completed; prior records   Depression    Disorder of refraction    both eyes   Early stage nonexudative age-related macular degeneration of both eyes    Exophoria    Fibrocystic breast    GERD (gastroesophageal reflux disease)    History of echocardiogram 12/2012   Dr. 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   Posterior vitreous detachment, both eyes    Postmenopausal    Pseudophakia of left eye    Psoriasis 2009   Regular astigmatism, right eye 06/03/2019   S/P lumbar discectomy 09/24/2021   Sciatica of left side 05/2013   TIA (transient ischemic  attack) 2000   Urinary incontinence     Past Surgical History:  Procedure Laterality Date   CHOLECYSTECTOMY  11/2001   LUMBAR DISC SURGERY  04/2005   R L4-5  and L5-S1   SKIN BIOPSY Left 1999   left hand bcc vs scc   TONSILLECTOMY AND ADENOIDECTOMY      Outpatient Medications Prior to Visit  Medication Sig Dispense Refill   albuterol (VENTOLIN HFA) 108 (90 Base) MCG/ACT inhaler Inhale 2 puffs into the lungs every 6 (six) hours as needed for wheezing or shortness of breath. 8 g 3   aspirin EC 81 MG tablet Take 1 tablet (81 mg total) by mouth daily. Swallow whole. 90 tablet 3   benzonatate (TESSALON) 200 MG capsule Take 1 capsule (200 mg total) by mouth 2 (two) times daily as needed for cough. 30 capsule 0   ezetimibe (ZETIA) 10 MG tablet Take 1 tablet (10 mg total) by mouth daily. 90 tablet 3   hydrochlorothiazide (HYDRODIURIL) 50 MG tablet TAKE 1 TABLET(50 MG) BY MOUTH DAILY 90 tablet 1   losartan (COZAAR) 100 MG tablet Take 1 tablet (100 mg total) by mouth daily. 90 tablet 1   metoprolol succinate (TOPROL-XL) 25 MG 24 hr tablet Take 1 tablet (25 mg total) by mouth daily. 90 tablet 3   Multiple Vitamins-Minerals (MULTIVITAMIN ADULT PO) Take 1 Dose by mouth.     potassium chloride SA (KLOR-CON M) 20 MEQ  tablet Take 1 tablet (20 mEq total) by mouth daily. 90 tablet 3   Vibegron (GEMTESA PO) Take by mouth.     amoxicillin-clavulanate (AUGMENTIN) 875-125 MG tablet Take 1 tablet by mouth 2 (two) times daily. (Patient not taking: Reported on 10/01/2023) 20 tablet 0   predniSONE (DELTASONE) 20 MG tablet 60 mg x2d, 40 mg x4d, 20 mg x3d, 10 mg x2d (Patient not taking: Reported on 10/01/2023) 18 tablet 0   No facility-administered medications prior to visit.    Allergies  Allergen Reactions   Sulfa Antibiotics     Review of Systems  As per HPI  PE:    10/01/2023   10:54 AM 09/16/2023    9:47 AM 08/11/2023    9:00 AM  Vitals with BMI  Height  5\' 4"  5\' 4"   Weight 157 lbs 3 oz 156 lbs 3  oz 151 lbs  BMI 26.97 26.8 25.91  Systolic 111 138   Diastolic 62 85   Pulse 76 67    02 sat 95% RA today  Physical Exam  Gen: Alert, well appearing.  Patient is oriented to person, place, time, and situation. AFFECT: pleasant, lucid thought and speech. ZOX:WRUE: no injection, icteris, swelling, or exudate.  EOMI, PERRLA. Mouth: lips without lesion/swelling.  Oral mucosa pink and moist. Oropharynx without erythema, exudate, or swelling.  CV: RRR, no m/r/g.   LUNGS: CTA bilat on inspiration, left base with bronchial breath sounds, trace end expiratory wheeze and mild prolongation of expiratory phase.  Aeration is good and breathing is nonlabored.  LABS:  Last CBC Lab Results  Component Value Date   WBC 10.7 (H) 09/06/2022   HGB 15.4 (H) 09/06/2022   HCT 43.7 09/06/2022   MCV 93.6 09/06/2022   RDW 13.4 09/06/2022   PLT 178.0 09/06/2022   Last metabolic panel Lab Results  Component Value Date   GLUCOSE 141 (H) 04/23/2023   NA 138 04/23/2023   K 3.3 (L) 04/23/2023   CL 101 04/23/2023   CO2 30 04/23/2023   BUN 27 (H) 04/23/2023   CREATININE 0.52 04/23/2023   GFR 91.94 04/23/2023   CALCIUM 9.5 04/23/2023   PROT 6.2 04/23/2023   ALBUMIN 4.1 04/23/2023   BILITOT 0.8 04/23/2023   ALKPHOS 69 04/23/2023   AST 18 04/23/2023   ALT 15 04/23/2023   IMPRESSION AND PLAN:  Persistent acute bronchitis. Focal bronchial breath sounds in the left lung base. Check chest x-ray today. At this point we will hold off on further antibiotics unless chest x-ray shows infiltrate. Will prescribe prednisone 40 mg a day x 5 days then 20 mg a day x 5 days.  She will continue [employer].  An After Visit Summary was printed and given to the patient.  FOLLOW UP: No follow-ups on file.  Signed:  Santiago Bumpers, MD           10/01/2023

## 2023-10-01 NOTE — Telephone Encounter (Signed)
   Chief Complaint: cough Symptoms: worsening cough, "rattling chest" Frequency: worsening over the past few weeks Pertinent Negatives: Patient denies fever, sob, chest pain Disposition: [] ED /[] Urgent Care (no appt availability in office) / [x] Appointment(In office/virtual)/ []  [facility]/ [] Home Care/ [] Refused Recommended Disposition /[] [facility]/ []  Follow-up with PCP Additional Notes: Patient reports she was seen previously for a cold and her cough has not gotten better, even after her abx and steroids. Patient reports she now hears a "rattling in her chest" when coughing. Per protocol, appt scheduled in office today 3/19. Patient advised to call back with worsening symptoms. Patient verbalized understanding.   Copied from CRM [id number]. Topic: Clinical - Red Word Triage >> Oct 01, 2023  9:27 AM Pascal Lux wrote: Red Word that prompted transfer to Nurse Triage: Patient stated that she's taken everything for her cough and still experiencing a severe cough. Reason for Disposition  [1] Continuous (nonstop) coughing interferes with work or school AND [2] no improvement using cough treatment per Care Advice  Answer Assessment - Initial Assessment Questions 1. ONSET: "When did the cough begin?"      weeks 2. SEVERITY: "How bad is the cough today?"      moderate 3. SPUTUM: "Describe the color of your sputum" (none, dry cough; clear, white, yellow, green)     clear 4. HEMOPTYSIS: "Are you coughing up any blood?" If so ask: "How much?" (flecks, streaks, tablespoons, etc.)     none 5. DIFFICULTY BREATHING: "Are you having difficulty breathing?" If Yes, ask: "How bad is it?" (e.g., mild, moderate, severe)    - MILD: No SOB at rest, mild SOB with walking, speaks normally in sentences, can lie down, no retractions, pulse < 100.    - MODERATE: SOB at rest, SOB with minimal exertion and prefers to sit, cannot lie down flat, speaks in phrases, mild retractions, audible wheezing,  pulse 100-120.    - SEVERE: Very SOB at rest, speaks in single words, struggling to breathe, sitting hunched forward, retractions, pulse > 120      none 6. FEVER: "Do you have a fever?" If Yes, ask: "What is your temperature, how was it measured, and when did it start?"     none 7. CARDIAC HISTORY: "Do you have any history of heart disease?" (e.g., heart attack, congestive heart failure)      none 8. LUNG HISTORY: "Do you have any history of lung disease?"  (e.g., pulmonary embolus, asthma, emphysema)     none 9. PE RISK FACTORS: "Do you have a history of blood clots?" (or: recent major surgery, recent prolonged travel, bedridden)     none 10. OTHER SYMPTOMS: "Do you have any other symptoms?" (e.g., runny nose, wheezing, chest pain)       "Rattling in chest"  Protocols used: Cough - Acute Productive-A-AH

## 2023-10-08 ENCOUNTER — Encounter: Payer: Self-pay | Admitting: Family Medicine

## 2023-10-08 ENCOUNTER — Ambulatory Visit (INDEPENDENT_AMBULATORY_CARE_PROVIDER_SITE_OTHER): Payer: Medicare Other | Admitting: Family Medicine

## 2023-10-08 VITALS — BP 128/62 | HR 88 | Temp 98.0°F | Wt 159.8 lb

## 2023-10-08 DIAGNOSIS — Z5320 Procedure and treatment not carried out because of patient's decision for unspecified reasons: Secondary | ICD-10-CM | POA: Diagnosis not present

## 2023-10-08 DIAGNOSIS — I10 Essential (primary) hypertension: Secondary | ICD-10-CM

## 2023-10-08 DIAGNOSIS — F17200 Nicotine dependence, unspecified, uncomplicated: Secondary | ICD-10-CM

## 2023-10-08 DIAGNOSIS — R7309 Other abnormal glucose: Secondary | ICD-10-CM | POA: Diagnosis not present

## 2023-10-08 DIAGNOSIS — E782 Mixed hyperlipidemia: Secondary | ICD-10-CM | POA: Diagnosis not present

## 2023-10-08 DIAGNOSIS — J432 Centrilobular emphysema: Secondary | ICD-10-CM

## 2023-10-08 LAB — COMPREHENSIVE METABOLIC PANEL WITH GFR
ALT: 15 U/L (ref 0–35)
AST: 14 U/L (ref 0–37)
Albumin: 4.2 g/dL (ref 3.5–5.2)
Alkaline Phosphatase: 55 U/L (ref 39–117)
BUN: 25 mg/dL — ABNORMAL HIGH (ref 6–23)
CO2: 31 meq/L (ref 19–32)
Calcium: 9.7 mg/dL (ref 8.4–10.5)
Chloride: 98 meq/L (ref 96–112)
Creatinine, Ser: 0.61 mg/dL (ref 0.40–1.20)
GFR: 88.18 mL/min (ref >60.00)
Glucose, Bld: 118 mg/dL — ABNORMAL HIGH (ref 70–99)
Potassium: 4.3 meq/L (ref 3.5–5.1)
Sodium: 136 meq/L (ref 135–145)
Total Bilirubin: 0.8 mg/dL (ref 0.2–1.2)
Total Protein: 6.7 g/dL (ref 6.0–8.3)

## 2023-10-08 LAB — CBC
HCT: 44.8 % (ref 36.0–46.0)
Hemoglobin: 15.5 g/dL — ABNORMAL HIGH (ref 12.0–15.0)
MCHC: 34.7 g/dL (ref 30.0–36.0)
MCV: 96.1 fl (ref 78.0–100.0)
Platelets: 204 10*3/uL (ref 150.0–400.0)
RBC: 4.66 Mil/uL (ref 3.87–5.11)
RDW: 13 % (ref 11.5–15.5)
WBC: 12.7 10*3/uL — ABNORMAL HIGH (ref 4.0–10.5)

## 2023-10-08 LAB — HEMOGLOBIN A1C: Hgb A1c MFr Bld: 5.3 % (ref 4.6–6.5)

## 2023-10-08 MED ORDER — ALBUTEROL SULFATE HFA 108 (90 BASE) MCG/ACT IN AERS: 2.0000 | INHALATION_SPRAY | Freq: Four times a day (QID) | RESPIRATORY_TRACT | 0 refills | Status: DC | PRN

## 2023-10-08 MED ORDER — BUDESONIDE-FORMOTEROL FUMARATE 160-4.5 MCG/ACT IN AERO: 1.0000 | INHALATION_SPRAY | Freq: Two times a day (BID) | RESPIRATORY_TRACT | 5 refills | Status: AC

## 2023-10-08 MED ORDER — IPRATROPIUM-ALBUTEROL 0.5-2.5 (3) MG/3ML IN SOLN
3.0000 mL | Freq: Once | RESPIRATORY_TRACT | Status: AC
Start: 2023-10-08 — End: 2023-10-08
  Administered 2023-10-08: 3 mL via RESPIRATORY_TRACT

## 2023-10-08 MED ORDER — POTASSIUM CHLORIDE CRYS ER 20 MEQ PO TBCR: 20.0000 meq | EXTENDED_RELEASE_TABLET | Freq: Every day | ORAL | 3 refills | Status: DC

## 2023-10-08 MED ORDER — LOSARTAN POTASSIUM 100 MG PO TABS: 100.0000 mg | ORAL_TABLET | Freq: Every day | ORAL | 1 refills | Status: DC

## 2023-10-08 MED ORDER — METOPROLOL SUCCINATE ER 25 MG PO TB24: 25.0000 mg | ORAL_TABLET | Freq: Every day | ORAL | 3 refills | Status: DC

## 2023-10-08 MED ORDER — HYDROCHLOROTHIAZIDE 50 MG PO TABS: ORAL_TABLET | ORAL | 1 refills | Status: DC

## 2023-10-08 NOTE — Progress Notes (Addendum)
 /   [name] , [birth year], 73 y.o., female MRN: [id number] Patient Care Team    Relationship Specialty Notifications Start End  Sandra Leatherwood, DO PCP - General Family Medicine  01/04/16   [name], DPM Consulting Physician Podiatry  01/12/16   Sandra Memos, MD Consulting Physician Neurosurgery  10/03/21   [name], Sandra M, MD Consulting Physician Cardiology  04/23/23   Sandra Martinez, MD Consulting Physician Urology  04/23/23   Sandra Massed, MD Referring Physician Ophthalmology  04/23/23   Sandra Borne, MD Consulting Physician Cardiothoracic Surgery  04/23/23     Chief Complaint  Patient presents with   Hypertension   Hyperlipidemia    Subjective: Pt presents for routine OV follow up on Chronic Conditions/illness Management Hypertension/morbid obesity/hyperlipidemia/TIA: Pt reports compliance  with Cozaar 100  every day and HCTZ 50 qd.  Patient denies chest pain, shortness of breath, dizziness or lower extremity edema.   Exercise: routine exercise.  RF: Hypertension, history of TIA, smoker, family history of heart disease Patient had been on metoprolol and Eliquis for suspected atrial flutter, when seen by another provider for chest discomfort.  Cardiology did not feel EKG tracing was consistent with atrial flutter and discontinued metoprolol and Eliquis.  Patient was encouraged to restart baby aspirin for her history of TIA.  COPD: Recent copd flare, required albuteral, abx and x2 prednisone. Still has cough. Still smoking.  Cxr reassuring 09/2023, CT chest reassuring 07/2023    09/16/2023    9:50 AM 12/04/2022    1:10 PM 09/05/2022   10:22 AM 07/01/2022   10:33 AM 09/24/2021   10:12 AM  Depression screen PHQ 2/9  Decreased Interest 0 0 0 0 0  Down, Depressed, Hopeless 0 0 0 0 0  PHQ - 2 Score 0 0 0 0 0      01/08/2017    3:43 PM  GAD 7 : Generalized Anxiety Score  Nervous, Anxious, on Edge 2  Control/stop worrying 2  Worry too much - different things 0   Trouble relaxing 0  Restless 0  Easily annoyed or irritable 0  Afraid - awful might happen 0  Total GAD 7 Score 4  Anxiety Difficulty Very difficult    Allergies  Allergen Reactions   Sulfa Antibiotics    Social History   Tobacco Use   Smoking status: Every Day    Current packs/day: 1.00    Average packs/day: 1 pack/day for 3.0 years (3.0 ttl pk-yrs)    Types: Cigarettes   Smokeless tobacco: Never  Substance Use Topics   Alcohol use: No    Alcohol/week: 0.0 standard drinks of alcohol   Past Medical History:  Diagnosis Date   Allergic rhinitis    Astigmatism    right eye   Back pain    Dr. Jennet Maduro ([facility])   Basal cell carcinoma    Closed fracture of fifth metatarsal bone of left foot 04/12/2022   Combined form of age-related cataract, both eyes    Combined forms of age-related cataract of right eye 06/03/2019   Cystocele    Demyelinating disease (HCC) 2003   No definitive workup completed; prior records   Depression    Disorder of refraction    both eyes   Early stage nonexudative age-related macular degeneration of both eyes    Exophoria    Fibrocystic breast    GERD (gastroesophageal reflux disease)    History of echocardiogram 12/2012   Dr. Marny Lowenstein: EF55-60%, mild TR, trace pulmonic and  MR, abnl left vent diastolic   Hypertension    Lumbar pain 09/24/2021   Lumbar radiculopathy 09/24/2021   Nonexudative age-related macular degeneration, bilateral, early dry stage    Piriformis syndrome 05/2013   Posterior vitreous detachment, both eyes    Postmenopausal    Pseudophakia of left eye    Psoriasis 2009   Regular astigmatism, right eye 06/03/2019   S/P lumbar discectomy 09/24/2021   Sciatica of left side 05/2013   TIA (transient ischemic attack) 2000   Urinary incontinence    Past Surgical History:  Procedure Laterality Date   CHOLECYSTECTOMY  11/2001   LUMBAR DISC SURGERY  04/2005   R L4-5  and L5-S1   SKIN BIOPSY Left 1999   left  hand bcc vs scc   TONSILLECTOMY AND ADENOIDECTOMY     Family History  Problem Relation Age of Onset   Rheum arthritis Mother    Alcohol abuse Mother    Heart disease Father 21       MI at 4; died   Diabetes Father    Dementia Maternal Grandmother    Allergies as of 10/08/2023       Reactions   Sulfa Antibiotics         Medication List        Accurate as of October 08, 2023  3:56 PM. If you have any questions, ask your nurse or doctor.          STOP taking these medications    benzonatate 200 MG capsule Commonly known as: TESSALON Stopped by: Felix Pacini   predniSONE 20 MG tablet Commonly known as: DELTASONE Stopped by: Felix Pacini       TAKE these medications    albuterol 108 (90 Base) MCG/ACT inhaler Commonly known as: VENTOLIN HFA Inhale 2 puffs into the lungs every 6 (six) hours as needed for wheezing or shortness of breath. Started by: Felix Pacini   aspirin EC 81 MG tablet Take 1 tablet (81 mg total) by mouth daily. Swallow whole.   budesonide-formoterol 160-4.5 MCG/ACT inhaler Commonly known as: SYMBICORT Inhale 1 puff into the lungs 2 (two) times daily. Started by: Felix Pacini   estradiol 0.1 MG/GM vaginal cream Commonly known as: ESTRACE Place vaginally.   ezetimibe 10 MG tablet Commonly known as: ZETIA Take 1 tablet (10 mg total) by mouth daily.   GEMTESA PO Take by mouth.   hydrochlorothiazide 50 MG tablet Commonly known as: HYDRODIURIL TAKE 1 TABLET(50 MG) BY MOUTH DAILY   losartan 100 MG tablet Commonly known as: COZAAR Take 1 tablet (100 mg total) by mouth daily.   metoprolol succinate 25 MG 24 hr tablet Commonly known as: TOPROL-XL Take 1 tablet (25 mg total) by mouth daily.   MULTIVITAMIN ADULT PO Take 1 Dose by mouth.   potassium chloride SA 20 MEQ tablet Commonly known as: KLOR-CON Waters Take 1 tablet (20 mEq total) by mouth daily.   solifenacin 5 MG tablet Commonly known as: VESICARE Take 5 mg by mouth daily.         No results found for this or any previous visit (from the past 24 hours).   No results found.   ROS: Negative, with the exception of above mentioned in HPI ROS  Objective:  BP 128/62   Pulse 88   Temp 98 F (36.7 C)   Wt 159 lb 12.8 oz (72.5 kg)   SpO2 97%   BMI 27.43 kg/Waters  Body mass index is 27.43 kg/Waters. Physical Exam Vitals and  nursing note reviewed.  Constitutional:      General: She is not in acute distress.    Appearance: Normal appearance. She is not ill-appearing, toxic-appearing or diaphoretic.  HENT:     Head: Normocephalic and atraumatic.  Eyes:     General: No scleral icterus.       Right eye: No discharge.        Left eye: No discharge.     Extraocular Movements: Extraocular movements intact.     Conjunctiva/sclera: Conjunctivae normal.     Pupils: Pupils are equal, round, and reactive to light.  Cardiovascular:     Rate and Rhythm: Normal rate and regular rhythm.  Pulmonary:     Effort: Pulmonary effort is normal. No respiratory distress.     Breath sounds: Rhonchi present. No wheezing or rales.     Comments: cough Musculoskeletal:     Right lower leg: No edema.     Left lower leg: No edema.  Skin:    General: Skin is warm.     Findings: No rash.  Neurological:     Mental Status: She is alert and oriented to person, place, and time. Mental status is at baseline.     Motor: No weakness.     Gait: Gait normal.  Psychiatric:        Mood and Affect: Mood normal.        Behavior: Behavior normal.        Thought Content: Thought content normal.        Judgment: Judgment normal.     No results found for this or any previous visit (from the past 24 hours).    Assessment/Plan: KAJUANA SHAREEF is a 73 y.o. female present for OV for Chronic Conditions/illness Management Essential hypertension, benign/morbid obesity/hyperlipidemia/history of TIA/long-term current med use Stable Continue metoprolol 25 XL- tachycardia and BP borderline.   Continue  losartan 100 QD Continue HCTZ 50 mg daily. - continue ASA 81 - low sodium, exercise.  Cbc, cmp and tsh collected Ldl UTD 04/2023 - Follow-up 5.5 months  Elevated glucose A1c collected  Current smoker/Centrilobular emphysema (HCC) Encouraged her to stop smoking.  Continue albuterol prn Start symbicort 1 puff BID.  Consider pulm referral if not seeing improvement.  Diminished breath sounds with rhonchi present> duoneb x1> improved air movement.    Return in about 24 weeks (around 03/24/2024) for Routine chronic condition follow-up.  Orders Placed This Encounter  Procedures   CBC   Comp Met (CMET)   TSH   Hemoglobin A1c    Meds ordered this encounter  Medications   hydrochlorothiazide (HYDRODIURIL) 50 MG tablet    Sig: TAKE 1 TABLET(50 MG) BY MOUTH DAILY    Dispense:  90 tablet    Refill:  1   losartan (COZAAR) 100 MG tablet    Sig: Take 1 tablet (100 mg total) by mouth daily.    Dispense:  90 tablet    Refill:  1   metoprolol succinate (TOPROL-XL) 25 MG 24 hr tablet    Sig: Take 1 tablet (25 mg total) by mouth daily.    Dispense:  90 tablet    Refill:  3   potassium chloride SA (KLOR-CON Waters) 20 MEQ tablet    Sig: Take 1 tablet (20 mEq total) by mouth daily.    Dispense:  90 tablet    Refill:  3   budesonide-formoterol (SYMBICORT) 160-4.5 MCG/ACT inhaler    Sig: Inhale 1 puff into the lungs 2 (two) times daily.  Dispense:  1 each    Refill:  5   albuterol (VENTOLIN HFA) 108 (90 Base) MCG/ACT inhaler    Sig: Inhale 2 puffs into the lungs every 6 (six) hours as needed for wheezing or shortness of breath.    Dispense:  8 g    Refill:  0   ipratropium-albuterol (DUONEB) 0.5-2.5 (3) MG/3ML nebulizer solution 3 mL       electronically signed by:  Felix Pacini, DO  [facility] [place]

## 2023-10-08 NOTE — Patient Instructions (Signed)

## 2023-10-09 LAB — TSH: TSH: 1.11 u[IU]/mL (ref 0.35–5.50)

## 2023-10-27 ENCOUNTER — Other Ambulatory Visit: Payer: Self-pay | Admitting: Surgery

## 2023-10-27 DIAGNOSIS — I712 Thoracic aortic aneurysm, without rupture, unspecified: Secondary | ICD-10-CM

## 2023-11-25 ENCOUNTER — Other Ambulatory Visit

## 2023-12-02 ENCOUNTER — Ambulatory Visit

## 2024-01-07 ENCOUNTER — Ambulatory Visit: Payer: Medicare Other | Admitting: *Deleted

## 2024-01-07 VITALS — Ht 64.0 in | Wt 159.0 lb

## 2024-01-07 DIAGNOSIS — Z0000 Encounter for general adult medical examination without abnormal findings: Secondary | ICD-10-CM | POA: Diagnosis not present

## 2024-01-07 NOTE — Patient Instructions (Signed)
 [name] , Thank you for taking time to come for your Medicare Wellness Visit. I appreciate your ongoing commitment to your health goals. Please review the following plan we discussed and let me know if I can assist you in the future.   Screening recommendations/referrals: Colonoscopy: up to date Mammogram: up to date Bone Density: up to date Recommended yearly ophthalmology/optometry visit for glaucoma screening and checkup Recommended yearly dental visit for hygiene and checkup  Vaccinations: Influenza vaccine: up to date Pneumococcal vaccine: up to date Tdap vaccine: Education provided     Preventive Care 65 Years and Older, Female Preventive care refers to lifestyle choices and visits with your health care provider that can promote health and wellness. What does preventive care include? A yearly physical exam. This is also called an annual well check. Dental exams once or twice a year. Routine eye exams. Ask your health care provider how often you should have your eyes checked. Personal lifestyle choices, including: Daily care of your teeth and gums. Regular physical activity. Eating a healthy diet. Avoiding tobacco and drug use. Limiting alcohol use. Practicing safe sex. Taking low-dose aspirin  every day. Taking vitamin and mineral supplements as recommended by your health care provider. What happens during an annual well check? The services and screenings done by your health care provider during your annual well check will depend on your age, overall health, lifestyle risk factors, and family history of disease. Counseling  Your health care provider may ask you questions about your: Alcohol use. Tobacco use. Drug use. Emotional well-being. Home and relationship well-being. Sexual activity. Eating habits. History of falls. Memory and ability to understand (cognition). Work and work Astronomer. Reproductive health. Screening  You may have the following tests or  measurements: Height, weight, and BMI. Blood pressure. Lipid and cholesterol levels. These may be checked every 5 years, or more frequently if you are over 109 years old. Skin check. Lung cancer screening. You may have this screening every year starting at age 41 if you have a 30-pack-year history of smoking and currently smoke or have quit within the past 15 years. Fecal occult blood test (FOBT) of the stool. You may have this test every year starting at age 39. Flexible sigmoidoscopy or colonoscopy. You may have a sigmoidoscopy every 5 years or a colonoscopy every 10 years starting at age 53. Hepatitis C blood test. Hepatitis B blood test. Sexually transmitted disease (STD) testing. Diabetes screening. This is done by checking your blood sugar (glucose) after you have not eaten for a while (fasting). You may have this done every 1-3 years. Bone density scan. This is done to screen for osteoporosis. You may have this done starting at age 28. Mammogram. This may be done every 1-2 years. Talk to your health care provider about how often you should have regular mammograms. Talk with your health care provider about your test results, treatment options, and if necessary, the need for more tests. Vaccines  Your health care provider may recommend certain vaccines, such as: Influenza vaccine. This is recommended every year. Tetanus, diphtheria, and acellular pertussis (Tdap, Td) vaccine. You may need a Td booster every 10 years. Zoster vaccine. You may need this after age 79. Pneumococcal 13-valent conjugate (PCV13) vaccine. One dose is recommended after age 56. Pneumococcal polysaccharide (PPSV23) vaccine. One dose is recommended after age 34. Talk to your health care provider about which screenings and vaccines you need and how often you need them. This information is not intended to replace advice  given to you by your health care provider. Make sure you discuss any questions you have with your  health care provider. Document Released: 07/28/2015 Document Revised: 03/20/2016 Document Reviewed: 05/02/2015 Elsevier Interactive Patient Education  2017 [employer].  Fall Prevention in the Home Falls can cause injuries. They can happen to people of all ages. There are many things you can do to make your home safe and to help prevent falls. What can I do on the outside of my home? Regularly fix the edges of walkways and driveways and fix any cracks. Remove anything that might make you trip as you walk through a door, such as a raised step or threshold. Trim any bushes or trees on the path to your home. Use bright outdoor lighting. Clear any walking paths of anything that might make someone trip, such as rocks or tools. Regularly check to see if handrails are loose or broken. Make sure that both sides of any steps have handrails. Any raised decks and porches should have guardrails on the edges. Have any leaves, snow, or ice cleared regularly. Use sand or salt on walking paths during winter. Clean up any spills in your garage right away. This includes oil or grease spills. What can I do in the bathroom? Use night lights. Install grab bars by the toilet and in the tub and shower. Do not use towel bars as grab bars. Use non-skid mats or decals in the tub or shower. If you need to sit down in the shower, use a plastic, non-slip stool. Keep the floor dry. Clean up any water that spills on the floor as soon as it happens. Remove soap buildup in the tub or shower regularly. Attach bath mats securely with double-sided non-slip rug tape. Do not have throw rugs and other things on the floor that can make you trip. What can I do in the bedroom? Use night lights. Make sure that you have a light by your bed that is easy to reach. Do not use any sheets or blankets that are too big for your bed. They should not hang down onto the floor. Have a firm chair that has side arms. You can use this for  support while you get dressed. Do not have throw rugs and other things on the floor that can make you trip. What can I do in the kitchen? Clean up any spills right away. Avoid walking on wet floors. Keep items that you use a lot in easy-to-reach places. If you need to reach something above you, use a strong step stool that has a grab bar. Keep electrical cords out of the way. Do not use floor polish or wax that makes floors slippery. If you must use wax, use non-skid floor wax. Do not have throw rugs and other things on the floor that can make you trip. What can I do with my stairs? Do not leave any items on the stairs. Make sure that there are handrails on both sides of the stairs and use them. Fix handrails that are broken or loose. Make sure that handrails are as long as the stairways. Check any carpeting to make sure that it is firmly attached to the stairs. Fix any carpet that is loose or worn. Avoid having throw rugs at the top or bottom of the stairs. If you do have throw rugs, attach them to the floor with carpet tape. Make sure that you have a light switch at the top of the stairs and the bottom of  the stairs. If you do not have them, ask someone to add them for you. What else can I do to help prevent falls? Huebsch shoes that: Do not have high heels. Have rubber bottoms. Are comfortable and fit you well. Are closed at the toe. Do not Swiney sandals. If you use a stepladder: Make sure that it is fully opened. Do not climb a closed stepladder. Make sure that both sides of the stepladder are locked into place. Ask someone to hold it for you, if possible. Clearly mark and make sure that you can see: Any grab bars or handrails. First and last steps. Where the edge of each step is. Use tools that help you move around (mobility aids) if they are needed. These include: Canes. Walkers. Scooters. Crutches. Turn on the lights when you go into a dark area. Replace any light bulbs as soon  as they burn out. Set up your furniture so you have a clear path. Avoid moving your furniture around. If any of your floors are uneven, fix them. If there are any pets around you, be aware of where they are. Review your medicines with your doctor. Some medicines can make you feel dizzy. This can increase your chance of falling. Ask your doctor what other things that you can do to help prevent falls. This information is not intended to replace advice given to you by your health care provider. Make sure you discuss any questions you have with your health care provider. Document Released: 04/27/2009 Document Revised: 12/07/2015 Document Reviewed: 08/05/2014 Elsevier Interactive Patient Education  2017 [employer].

## 2024-01-07 NOTE — Progress Notes (Signed)
 Subjective:   [name] is a 73 y.o. female who presents for Medicare Annual (Subsequent) preventive examination.  Visit Complete: Virtual I connected with  [name] on 01/07/24 by a audio enabled telemedicine application and verified that I am speaking with the correct person using two identifiers.  Patient Location: Home  Provider Location: Home Office  I discussed the limitations of evaluation and management by telemedicine. The patient expressed understanding and agreed to proceed.  Vital Signs: Because this visit was a virtual/telehealth visit, some criteria may be missing or patient reported. Any vitals not documented were not able to be obtained and vitals that have been documented are patient reported.   Cardiac Risk Factors include: advanced age (>84men, >93 women);obesity (BMI >30kg/m2)     Objective:    Today's Vitals   01/07/24 1019  Weight: 159 lb (72.1 kg)  Height: 5' 4 (1.626 m)   Body mass index is 27.29 kg/m.     01/07/2024   10:19 AM 12/04/2022    1:11 PM 09/25/2021    1:32 PM 09/20/2021   12:05 PM 04/12/2020    8:20 AM 02/10/2019    8:03 AM 11/19/2016   10:24 AM  Advanced Directives  Does Patient Have a Medical Advance Directive? Yes Yes Yes Yes Yes Yes Yes;No   Type of Estate agent of [employer] Power of [place];Living will Healthcare Power of [place];Living will Healthcare Power of [place];Living will Healthcare Power of [place];Living will Healthcare Power of [place];Living will;Out of facility DNR (pink MOST or yellow form)   Does patient want to make changes to medical advance directive?    No - Patient declined   Yes (MAU/Ambulatory/Procedural Areas - Information given)   Copy of Healthcare Power of Attorney in Chart? No - copy requested No - copy requested No - copy requested No - copy requested No - copy requested No - copy requested    Would patient like information on creating a medical advance directive?    No - Patient declined No - Patient declined        Data saved with a previous flowsheet row definition    Current Medications (verified) Outpatient Encounter Medications as of 01/07/2024  Medication Sig   albuterol  (VENTOLIN  HFA) 108 (90 Base) MCG/ACT inhaler Inhale 2 puffs into the lungs every 6 (six) hours as needed for wheezing or shortness of breath.   aspirin  EC 81 MG tablet Take 1 tablet (81 mg total) by mouth daily. Swallow whole.   budesonide -formoterol  (SYMBICORT ) 160-4.5 MCG/ACT inhaler Inhale 1 puff into the lungs 2 (two) times daily.   estradiol (ESTRACE) 0.1 MG/GM vaginal cream Place vaginally.   ezetimibe  (ZETIA ) 10 MG tablet Take 1 tablet (10 mg total) by mouth daily.   hydrochlorothiazide  (HYDRODIURIL ) 50 MG tablet TAKE 1 TABLET(50 MG) BY MOUTH DAILY   losartan  (COZAAR ) 100 MG tablet Take 1 tablet (100 mg total) by mouth daily.   metoprolol  succinate (TOPROL -XL) 25 MG 24 hr tablet Take 1 tablet (25 mg total) by mouth daily.   Multiple Vitamins-Minerals (MULTIVITAMIN ADULT PO) Take 1 Dose by mouth.   potassium chloride  SA (KLOR-CON  M) 20 MEQ tablet Take 1 tablet (20 mEq total) by mouth daily.   solifenacin (VESICARE) 5 MG tablet Take 5 mg by mouth daily.   Vibegron (GEMTESA PO) Take by mouth.   No facility-administered encounter medications on file as of 01/07/2024.    Allergies (verified) Sulfa antibiotics   History: Past Medical History:  Diagnosis Date  Allergic rhinitis    Astigmatism    right eye   Back pain    Dr. Marchelle ([facility])   Basal cell carcinoma    Closed fracture of fifth metatarsal bone of left foot 04/12/2022   Combined form of age-related cataract, both eyes    Combined forms of age-related cataract of right eye 06/03/2019   Cystocele    Demyelinating disease (HCC) 2003   No definitive workup completed; prior records   Depression    Disorder of refraction    both eyes   Early stage nonexudative age-related macular  degeneration of both eyes    Exophoria    Fibrocystic breast    GERD (gastroesophageal reflux disease)    History of echocardiogram 12/2012   Dr. 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   Posterior vitreous detachment, both eyes    Postmenopausal    Pseudophakia of left eye    Psoriasis 2009   Regular astigmatism, right eye 06/03/2019   S/P lumbar discectomy 09/24/2021   Sciatica of left side 05/2013   TIA (transient ischemic attack) 2000   Urinary incontinence    Past Surgical History:  Procedure Laterality Date   CHOLECYSTECTOMY  11/2001   LUMBAR DISC SURGERY  04/2005   R L4-5  and L5-S1   SKIN BIOPSY Left 1999   left hand bcc vs scc   TONSILLECTOMY AND ADENOIDECTOMY     Family History  Problem Relation Age of Onset   Rheum arthritis Mother    Alcohol abuse Mother    Heart disease Father 13       MI at 49; died   Diabetes Father    Dementia Maternal Grandmother    Social History   Socioeconomic History   Marital status: Widowed    Spouse name: Not on file   Number of children: Not on file   Years of education: Not on file   Highest education level: Some college, no degree  Occupational History   Not on file  Tobacco Use   Smoking status: Every Day    Current packs/day: 1.00    Average packs/day: 1 pack/day for 3.0 years (3.0 ttl pk-yrs)    Types: Cigarettes   Smokeless tobacco: Never  Vaping Use   Vaping status: Never Used  Substance and Sexual Activity   Alcohol use: No    Alcohol/week: 0.0 standard drinks of alcohol   Drug use: No   Sexual activity: Never  Other Topics Concern   Not on file  Social History Narrative   Widower 2018 (Damon). 2 sons.   College. Retired.    Former smoker (quit 04/2005- 30+ pack year), occasional etoh, no  drugs   Drinks caffeine, uses herbal remedies, daily vitamin use   Wears her seatbelt, wears her bicycle helmet   Exercises routinely   Smoke detector in the home.    Firearms in the home (locked)   Feels safe in her relationships.    Social Drivers of Corporate investment banker Strain: Low Risk  (01/07/2024)   Overall Financial Resource Strain (CARDIA)    Difficulty of Paying Living Expenses: Not hard at all  Food Insecurity: No Food Insecurity (01/07/2024)   Hunger Vital Sign    Worried About Running Out of Food in the Last Year: Never  true    Ran Out of Food in the Last Year: Never true  Transportation Needs: No Transportation Needs (01/07/2024)   PRAPARE - Administrator, Civil Service (Medical): No    Lack of Transportation (Non-Medical): No  Physical Activity: Insufficiently Active (01/07/2024)   Exercise Vital Sign    Days of Exercise per Week: 2 days    Minutes of Exercise per Session: 30 min  Stress: No Stress Concern Present (01/07/2024)   [employer] of Occupational Health - Occupational Stress Questionnaire    Feeling of Stress: Not at all  Social Connections: Moderately Integrated (12/02/2022)   Social Connection and Isolation Panel    Frequency of Communication with Friends and Family: Twice a week    Frequency of Social Gatherings with Friends and Family: Once a week    Attends Religious Services: More than 4 times per year    Active Member of [employer] or Organizations: Yes    Attends Banker Meetings: Never    Marital Status: Widowed    Tobacco Counseling Ready to quit: Not Answered Counseling given: Not Answered   Clinical Intake:                        Activities of Daily Living    01/07/2024   10:23 AM  In your present state of health, do you have any difficulty performing the following activities:  Hearing? 0  Vision? 0  Difficulty concentrating or making decisions? 0  Walking or climbing stairs? 0  Dressing  or bathing? 0  Doing errands, shopping? 0  Preparing Food and eating ? N  Using the Toilet? N  In the past six months, have you accidently leaked urine? N  Do you have problems with loss of bowel control? N  Managing your Medications? N  Managing your Finances? N  Housekeeping or managing your Housekeeping? N    Patient Care Team: Catherine Charlies LABOR, DO as PCP - General (Family Medicine) Gershon Donnice SAUNDERS, DPM as Consulting Physician (Podiatry) Gillie Duncans, MD as Consulting Physician (Neurosurgery) [place], Peter M, MD as Consulting Physician (Cardiology) Gaston Hamilton, MD as Consulting Physician (Urology) Elnor Mulders, MD as Referring Physician (Ophthalmology) Lucas Dorise POUR, MD as Consulting Physician (Cardiothoracic Surgery)  Indicate any recent Medical Services you may have received from other than [facility] providers in the past year (date may be approximate).     Assessment:   This is a routine wellness examination for Babita.  Hearing/Vision screen Hearing Screening - Comments:: No trouble hearing Vision Screening - Comments:: Up to date [facility]   Goals Addressed               This Visit's Progress     Patient Stated   Not on track     Maintain weight with healthy eating & increasing activity      Patient Stated (pt-stated)   On track     Stay healthy       Patient Stated        Stay healthy       Depression Screen    01/07/2024   10:22 AM 09/16/2023    9:50 AM 12/04/2022    1:10 PM 09/05/2022   10:22 AM 07/01/2022   10:33 AM 09/24/2021   10:12 AM 04/09/2021    9:41 AM  PHQ 2/9 Scores  PHQ - 2 Score 0 0 0 0 0 0 0  PHQ- 9 Score 0  Fall Risk    01/07/2024   10:17 AM 09/16/2023    9:49 AM 12/02/2022    1:34 PM 07/01/2022   10:33 AM 01/19/2022    2:20 PM  Fall Risk   Falls in the past year? 0 0 1 1 0  Number falls in past yr: 0 0 0 0   Injury with Fall? 0 0 1 1   Risk for fall due to :  No Fall Risks Impaired vision No Fall Risks   Follow up  Falls evaluation completed;Education provided;Falls prevention discussed Falls evaluation completed Falls prevention discussed Falls evaluation completed       Data saved with a previous flowsheet row definition    MEDICARE RISK AT HOME: Medicare Risk at Home Any stairs in or around the home?: Yes If so, are there any without handrails?: No Home free of loose throw rugs in walkways, pet beds, electrical cords, etc?: Yes Adequate lighting in your home to reduce risk of falls?: Yes Life alert?: No Use of a cane, walker or w/c?: No Grab bars in the bathroom?: Yes Shower chair or bench in shower?: Yes Elevated toilet seat or a handicapped toilet?: Yes  TIMED UP AND GO:  Was the test performed?  No    Cognitive Function:        01/07/2024   10:20 AM 12/04/2022    1:13 PM 09/25/2021    1:31 PM 04/12/2020    8:27 AM  6CIT Screen  What Year? 0 points 0 points 0 points 0 points  What month? 0 points 0 points 0 points 0 points  What time? 0 points 0 points 0 points 0 points  Count back from 20 0 points 0 points 0 points 0 points  Months in reverse 0 points 0 points 0 points 0 points  Repeat phrase 0 points 0 points 0 points 0 points  Total Score 0 points 0 points 0 points 0 points    Immunizations Immunization History  Administered Date(s) Administered   Moderna SARS-COV2 Booster Vaccination 11/07/2020, 03/22/2021   Moderna Sars-Covid-2 Vaccination 08/14/2019, 09/13/2019, 05/09/2020   PNEUMOCOCCAL CONJUGATE-20 02/27/2022   Tdap 12/21/2013   Zoster, Live 06/24/2013    TDAP status: Due, Education has been provided regarding the importance of this vaccine. Advised may receive this vaccine at local pharmacy or Health Dept. Aware to provide a copy of the vaccination record if obtained from local pharmacy or Health Dept. Verbalized acceptance and understanding.  Flu Vaccine status: Up to date  Pneumococcal vaccine status: Up to date  Covid-19 vaccine status: Information provided  on how to obtain vaccines.   Qualifies for Shingles Vaccine? Yes   Zostavax completed No   Shingrix Completed?: No.    Education has been provided regarding the importance of this vaccine. Patient has been advised to call insurance company to determine out of pocket expense if they have not yet received this vaccine. Advised may also receive vaccine at local pharmacy or Health Dept. Verbalized acceptance and understanding.  Screening Tests Health Maintenance  Topic Date Due   DTaP/Tdap/Td (2 - Td or Tdap) 12/22/2023   INFLUENZA VACCINE  02/13/2024   Medicare Annual Wellness (AWV)  01/06/2025   Fecal DNA (Cologuard)  09/08/2025   Pneumococcal Vaccine: 50+ Years  Completed   DEXA SCAN  Completed   Hepatitis C Screening  Completed   Hepatitis B Vaccines  Aged Out   HPV VACCINES  Aged Out   Meningococcal B Vaccine  Aged Out   MAMMOGRAM  Discontinued   COVID-19 Vaccine  Discontinued   Zoster Vaccines- Shingrix  Discontinued    Health Maintenance  Health Maintenance Due  Topic Date Due   DTaP/Tdap/Td (2 - Td or Tdap) 12/22/2023    Colorectal cancer screening: Type of screening: Cologuard. Completed 2024. Repeat every 3 years  Mammogram status: Completed  . Repeat every year  Bone Density status: Completed  . Results reflect: Bone density results: NORMAL. Repeat every   years.  Lung Cancer Screening: (Low Dose CT Chest recommended if Age 10-80 years, 20 pack-year currently smoking OR have quit w/in 15years.) does qualify.   Lung Cancer Screening Referral:  completed 2025  Additional Screening:  Hepatitis C Screening: does not qualify; Completed 2017  Vision Screening: Recommended annual ophthalmology exams for early detection of glaucoma and other disorders of the eye. Is the patient up to date with their annual eye exam?  Yes  Who is the provider or what is the name of the office in which the patient attends annual eye exams? [facility] If pt is not established with a provider,  would they like to be referred to a provider to establish care? No .   Dental Screening: Recommended annual dental exams for proper oral hygiene    Community Resource Referral / Chronic Care Management: CRR required this visit?  No   CCM required this visit?  No     Plan:     I have personally reviewed and noted the following in the patient's chart:   Medical and social history Use of alcohol, tobacco or illicit drugs  Current medications and supplements including opioid prescriptions. Patient is not currently taking opioid prescriptions. Functional ability and status Nutritional status Physical activity Advanced directives List of other physicians Hospitalizations, surgeries, and ER visits in previous 12 months Vitals Screenings to include cognitive, depression, and falls Referrals and appointments  In addition, I have reviewed and discussed with patient certain preventive protocols, quality metrics, and best practice recommendations. A written personalized care plan for preventive services as well as general preventive health recommendations were provided to patient.     Mliss Graff, LPN   [id number]   After Visit Summary: (MyChart) Due to this being a telephonic visit, the after visit summary with patients personalized plan was offered to patient via MyChart   Nurse Notes:

## 2024-01-30 DIAGNOSIS — Z23 Encounter for immunization: Secondary | ICD-10-CM | POA: Diagnosis not present

## 2024-02-17 DIAGNOSIS — R350 Frequency of micturition: Secondary | ICD-10-CM | POA: Diagnosis not present

## 2024-02-17 DIAGNOSIS — N3946 Mixed incontinence: Secondary | ICD-10-CM | POA: Diagnosis not present

## 2024-04-05 ENCOUNTER — Ambulatory Visit (INDEPENDENT_AMBULATORY_CARE_PROVIDER_SITE_OTHER): Admitting: Family Medicine

## 2024-04-05 ENCOUNTER — Encounter: Payer: Self-pay | Admitting: Family Medicine

## 2024-04-05 VITALS — BP 124/68 | HR 80 | Temp 98.3°F | Wt 160.2 lb

## 2024-04-05 DIAGNOSIS — J432 Centrilobular emphysema: Secondary | ICD-10-CM | POA: Diagnosis not present

## 2024-04-05 DIAGNOSIS — E782 Mixed hyperlipidemia: Secondary | ICD-10-CM | POA: Diagnosis not present

## 2024-04-05 DIAGNOSIS — I10 Essential (primary) hypertension: Secondary | ICD-10-CM

## 2024-04-05 DIAGNOSIS — Z8673 Personal history of transient ischemic attack (TIA), and cerebral infarction without residual deficits: Secondary | ICD-10-CM | POA: Diagnosis not present

## 2024-04-05 DIAGNOSIS — M1990 Unspecified osteoarthritis, unspecified site: Secondary | ICD-10-CM | POA: Diagnosis not present

## 2024-04-05 DIAGNOSIS — Z5320 Procedure and treatment not carried out because of patient's decision for unspecified reasons: Secondary | ICD-10-CM

## 2024-04-05 DIAGNOSIS — F17200 Nicotine dependence, unspecified, uncomplicated: Secondary | ICD-10-CM | POA: Diagnosis not present

## 2024-04-05 DIAGNOSIS — Z2821 Immunization not carried out because of patient refusal: Secondary | ICD-10-CM | POA: Diagnosis not present

## 2024-04-05 MED ORDER — POTASSIUM CHLORIDE CRYS ER 20 MEQ PO TBCR: 20.0000 meq | EXTENDED_RELEASE_TABLET | Freq: Every day | ORAL | 1 refills | Status: AC

## 2024-04-05 MED ORDER — LOSARTAN POTASSIUM 100 MG PO TABS: 100.0000 mg | ORAL_TABLET | Freq: Every day | ORAL | 1 refills | Status: AC

## 2024-04-05 MED ORDER — HYDROCHLOROTHIAZIDE 50 MG PO TABS: ORAL_TABLET | ORAL | 1 refills | Status: AC

## 2024-04-05 MED ORDER — METOPROLOL SUCCINATE ER 25 MG PO TB24: 25.0000 mg | ORAL_TABLET | Freq: Every day | ORAL | 1 refills | Status: AC

## 2024-04-05 MED ORDER — EZETIMIBE 10 MG PO TABS: 10.0000 mg | ORAL_TABLET | Freq: Every day | ORAL | 3 refills | Status: AC

## 2024-04-05 MED ORDER — ALBUTEROL SULFATE HFA 108 (90 BASE) MCG/ACT IN AERS: 2.0000 | INHALATION_SPRAY | Freq: Four times a day (QID) | RESPIRATORY_TRACT | 5 refills | Status: AC | PRN

## 2024-04-05 NOTE — Patient Instructions (Signed)
 Return in about 24 weeks (around 09/20/2024) for w/ labs.        Great to see you today.  I have refilled the medication(s) we provide.   If labs were collected or images ordered, we will inform you of  results once we have received them and reviewed. We will contact you either by echart message, or telephone call.  Please give ample time to the testing facility, and our office to run,  receive and review results. Please do not call inquiring of results, even if you can see them in your chart. We will contact you as soon as we are able. If it has been over 1 week since the test was completed, and you have not yet heard from us , then please call us .    - echart message- for normal results that have been seen by the patient already.   - telephone call: abnormal results or if patient has not viewed results in their echart.  If a referral to a specialist was entered for you, please call us  in 2 weeks if you have not heard from the specialist office to schedule.

## 2024-04-05 NOTE — Progress Notes (Signed)
 /   [name] , 26-Nov-1950, 73 y.o., female MRN: [id number] Patient Care Team    Relationship Specialty Notifications Start End  Catherine Charlies LABOR, DO PCP - General Family Medicine  01/04/16   Gershon Donnice SAUNDERS, DPM Consulting Physician Podiatry  01/12/16   Gillie Duncans, MD Consulting Physician Neurosurgery  10/03/21   [place], Peter M, MD Consulting Physician Cardiology  04/23/23   Gaston Hamilton, MD Consulting Physician Urology  04/23/23   Elnor Mulders, MD Referring Physician Ophthalmology  04/23/23   Lucas Dorise POUR, MD Consulting Physician Cardiothoracic Surgery  04/23/23     Chief Complaint  Patient presents with   Hypertension    Pt is fasting.     Subjective: Pt presents for routine OV follow up on Chronic Conditions/illness Management Medication reconciliation completed today Past medical history updated where appropriate  Hypertension/morbid obesity/hyperlipidemia/TIA: Pt reports compliance with Cozaar  100  every day and HCTZ 50 qd.  Patient reports compliance with Zetia  Patient denies chest pain, shortness of breath, dizziness or lower extremity edema.   Exercise: routine exercise.  RF: Hypertension, history of TIA, smoker, family history of heart disease Patient had been on metoprolol  and Eliquis  for suspected atrial flutter, when seen by another provider for chest discomfort.  Cardiology did not feel EKG tracing was consistent with atrial flutter and discontinued metoprolol  and Eliquis .  Patient was encouraged to restart baby aspirin  for her history of TIA.  COPD: Patient is still smoking. Uses albuterol  as needed and Symbicort  twice daily Cxr reassuring 09/2023, CT chest reassuring 07/2023     01/07/2024   10:22 AM 09/16/2023    9:50 AM 12/04/2022    1:10 PM 09/05/2022   10:22 AM 07/01/2022   10:33 AM  Depression screen PHQ 2/9  Decreased Interest 0 0 0 0 0  Down, Depressed, Hopeless 0 0 0 0 0  PHQ - 2 Score 0 0 0 0 0  Altered sleeping 0      Tired, decreased  energy 0      Change in appetite 0      Feeling bad or failure about yourself  0      Trouble concentrating 0      Moving slowly or fidgety/restless 0      Suicidal thoughts 0      PHQ-9 Score 0      Difficult doing work/chores Not difficult at all          01/08/2017    3:43 PM  GAD 7 : Generalized Anxiety Score  Nervous, Anxious, on Edge 2  Control/stop worrying 2  Worry too much - different things 0  Trouble relaxing 0  Restless 0  Easily annoyed or irritable 0  Afraid - awful might happen 0  Total GAD 7 Score 4  Anxiety Difficulty Very difficult    Allergies  Allergen Reactions   Sulfa Antibiotics    Social History   Tobacco Use   Smoking status: Every Day    Current packs/day: 1.00    Average packs/day: 1 pack/day for 3.0 years (3.0 ttl pk-yrs)    Types: Cigarettes   Smokeless tobacco: Never  Substance Use Topics   Alcohol use: No    Alcohol/week: 0.0 standard drinks of alcohol   Past Medical History:  Diagnosis Date   Allergic rhinitis    Astigmatism    right eye   Back pain    Dr. Marchelle ([facility])   Basal cell carcinoma    Closed fracture of fifth metatarsal  bone of left foot 04/12/2022   Combined form of age-related cataract, both eyes    Combined forms of age-related cataract of right eye 06/03/2019   Cystocele    Demyelinating disease (HCC) 2003   No definitive workup completed; prior records   Depression    Disorder of refraction    both eyes   Early stage nonexudative age-related macular degeneration of both eyes    Exophoria    Fibrocystic breast    GERD (gastroesophageal reflux disease)    History of echocardiogram 12/2012   Dr. 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   Posterior vitreous detachment, both  eyes    Postmenopausal    Pseudophakia of left eye    Psoriasis 2009   Regular astigmatism, right eye 06/03/2019   S/P lumbar discectomy 09/24/2021   Sciatica of left side 05/2013   TIA (transient ischemic attack) 2000   Urinary incontinence    Past Surgical History:  Procedure Laterality Date   CHOLECYSTECTOMY  11/2001   LUMBAR DISC SURGERY  04/2005   R L4-5  and L5-S1   SKIN BIOPSY Left 1999   left hand bcc vs scc   TONSILLECTOMY AND ADENOIDECTOMY     Family History  Problem Relation Age of Onset   Rheum arthritis Mother    Alcohol abuse Mother    Heart disease Father 65       MI at 35; died   Diabetes Father    Dementia Maternal Grandmother    Allergies as of 04/05/2024       Reactions   Sulfa Antibiotics         Medication List        Accurate as of April 05, 2024 10:06 AM. If you have any questions, ask your nurse or doctor.          STOP taking these medications    Potassium Chloride  ER 20 MEQ Tbcr Stopped by: Charlies Bellini       TAKE these medications    albuterol  108 (90 Base) MCG/ACT inhaler Commonly known as: VENTOLIN  HFA Inhale 2 puffs into the lungs every 6 (six) hours as needed for wheezing or shortness of breath.   aspirin  EC 81 MG tablet Take 1 tablet (81 mg total) by mouth daily. Swallow whole.   Boostrix 5-2.5-18.5 LF-MCG/0.5 injection Generic drug: Tdap   budesonide -formoterol  160-4.5 MCG/ACT inhaler Commonly known as: SYMBICORT  Inhale 1 puff into the lungs 2 (two) times daily.   estradiol 0.1 MG/GM vaginal cream Commonly known as: ESTRACE Place vaginally.   ezetimibe  10 MG tablet Commonly known as: ZETIA  Take 1 tablet (10 mg total) by mouth daily.   GEMTESA PO Take by mouth.   hydrochlorothiazide  50 MG tablet Commonly known as: HYDRODIURIL  TAKE 1 TABLET(50 MG) BY MOUTH DAILY   losartan  100 MG tablet Commonly known as: COZAAR  Take 1 tablet (100 mg total) by mouth daily.   metoprolol  succinate 25 MG 24 hr  tablet Commonly known as: TOPROL -XL Take 1 tablet (25 mg total) by mouth daily.   MULTIVITAMIN ADULT PO Take 1 Dose by mouth.   potassium chloride  SA 20 MEQ tablet Commonly known as: KLOR-CON  M Take 1 tablet (20 mEq total) by mouth daily.   solifenacin 5 MG tablet Commonly known as: VESICARE Take 5 mg by  mouth daily.        No results found for this or any previous visit (from the past 24 hours).   No results found.   ROS: Negative, with the exception of above mentioned in HPI ROS  Objective:  BP 124/68   Pulse 80   Temp 98.3 F (36.8 C)   Wt 160 lb 3.2 oz (72.7 kg)   SpO2 96%   BMI 27.50 kg/m  Body mass index is 27.5 kg/m. Physical Exam Vitals and nursing note reviewed.  Constitutional:      General: She is not in acute distress.    Appearance: Normal appearance. She is not ill-appearing, toxic-appearing or diaphoretic.  HENT:     Head: Normocephalic and atraumatic.  Eyes:     General: No scleral icterus.       Right eye: No discharge.        Left eye: No discharge.     Extraocular Movements: Extraocular movements intact.     Conjunctiva/sclera: Conjunctivae normal.     Pupils: Pupils are equal, round, and reactive to light.  Cardiovascular:     Rate and Rhythm: Normal rate and regular rhythm.     Heart sounds: No murmur heard. Pulmonary:     Effort: Pulmonary effort is normal. No respiratory distress.     Breath sounds: Normal breath sounds. No wheezing, rhonchi or rales.  Musculoskeletal:     Cervical back: Neck supple.     Right lower leg: No edema.     Left lower leg: No edema.  Skin:    General: Skin is warm.     Findings: No rash.  Neurological:     Mental Status: She is alert and oriented to person, place, and time. Mental status is at baseline.     Motor: No weakness.     Gait: Gait normal.  Psychiatric:        Mood and Affect: Mood normal.        Behavior: Behavior normal.        Thought Content: Thought content normal.         Judgment: Judgment normal.     No results found for this or any previous visit (from the past 24 hours).    Assessment/Plan: JAYDAH STAHLE is a 73 y.o. female present for OV for Chronic Conditions/illness Management Essential hypertension, benign/morbid obesity/hyperlipidemia/history of TIA/long-term current med use Stable Continue metoprolol  25 XL- tachycardia and BP borderline.  Continue losartan  100 QD Continue HCTZ 50 mg daily. Continue Zetia  - continue ASA 81 - low sodium, exercise.  Labs: Due next visit  Current smoker/Centrilobular emphysema (HCC) Encouraged her to stop smoking.  Continue albuterol  prn Continue symbicort  1 puff BID.  Consider pulm referral if not seeing improvement.   Influenza vaccine declined   Return in about 24 weeks (around 09/20/2024) for w/ labs.  No orders of the defined types were placed in this encounter.   Meds ordered this encounter  Medications   albuterol  (VENTOLIN  HFA) 108 (90 Base) MCG/ACT inhaler    Sig: Inhale 2 puffs into the lungs every 6 (six) hours as needed for wheezing or shortness of breath.    Dispense:  8 g    Refill:  5   ezetimibe  (ZETIA ) 10 MG tablet    Sig: Take 1 tablet (10 mg total) by mouth daily.    Dispense:  90 tablet    Refill:  3   hydrochlorothiazide  (HYDRODIURIL ) 50 MG tablet    Sig: TAKE 1 TABLET(50  MG) BY MOUTH DAILY    Dispense:  90 tablet    Refill:  1   losartan  (COZAAR ) 100 MG tablet    Sig: Take 1 tablet (100 mg total) by mouth daily.    Dispense:  90 tablet    Refill:  1   metoprolol  succinate (TOPROL -XL) 25 MG 24 hr tablet    Sig: Take 1 tablet (25 mg total) by mouth daily.    Dispense:  90 tablet    Refill:  1   potassium chloride  SA (KLOR-CON  M) 20 MEQ tablet    Sig: Take 1 tablet (20 mEq total) by mouth daily.    Dispense:  90 tablet    Refill:  1       electronically signed by:  Charlies Bellini, DO  [facility] - [place]

## 2024-05-03 ENCOUNTER — Ambulatory Visit: Payer: Self-pay

## 2024-05-03 NOTE — Telephone Encounter (Signed)
 FYI Only or Action Required?: FYI only for provider.  Patient was last seen in primary care on 04/05/2024 by Catherine Fuller A, DO.  Called Nurse Triage reporting Pt fell an hit her right side on a bed rail just below her ribs.. No bruising and no pain unless she cough , sneezes or hiccups.  Symptoms began a week ago.  Interventions attempted: Nothing.  Symptoms are: stable.  Triage Disposition: See PCP When Office is Open (Within 3 Days)  Patient/caregiver understands and will follow disposition?: Yes              Copied from CRM #[id number]. Topic: Clinical - Red Word Triage >> May 03, 2024  3:43 PM Frederich PARAS wrote: [facility] that prompted transfer to Nurse Triage: pain  Pt fell, she hit bed rail she hit it on her side, about over a week, she says its sore and painful, when she cough, hiccup and sneeze she feel pain  on right side below rib, nothing on outside no bruising. doesnt hurt all the time but she lay on it a certain way it hurts. Reason for Disposition  [1] MODERATE pain (e.g., interferes with normal activities) AND [2] present > 3 days  Answer Assessment - Initial Assessment Questions 1. ONSET: When did the muscle aches or body pains start?      After hitting herself on bed rail 2. LOCATION: What part of your body is hurting? (e.g., entire body, arms, legs)      Right side under ribs 3. SEVERITY: How bad is the pain? (Scale 1-10; or mild, moderate, severe)     With cough - 9/10 4. CAUSE: What do you think is causing the pains?     Fall on that side of her body. 5. FEVER: Do you have a fever? If Yes, ask: What is your temperature, how was it measured, and  when did it start?      no 6. OTHER SYMPTOMS: Do you have any other symptoms? (e.g., chest pain, cold or flu symptoms, rash, weakness, weight loss)     No bruising  Protocols used: Muscle Aches and Body Pain-A-AH

## 2024-05-05 ENCOUNTER — Encounter: Payer: Self-pay | Admitting: Family Medicine

## 2024-05-05 ENCOUNTER — Ambulatory Visit

## 2024-05-05 ENCOUNTER — Ambulatory Visit: Admitting: Family Medicine

## 2024-05-05 VITALS — BP 124/80 | HR 85 | Temp 98.2°F | Wt 157.6 lb

## 2024-05-05 DIAGNOSIS — R10A1 Flank pain, right side: Secondary | ICD-10-CM

## 2024-05-05 DIAGNOSIS — S3991XA Unspecified injury of abdomen, initial encounter: Secondary | ICD-10-CM

## 2024-05-05 MED ORDER — IOHEXOL 300 MG/ML  SOLN
100.0000 mL | Freq: Once | INTRAMUSCULAR | Status: AC | PRN
  Administered 2024-05-05: 100 mL via INTRAVENOUS

## 2024-05-05 MED ORDER — TIZANIDINE HCL 4 MG PO TABS: 2.0000 mg | ORAL_TABLET | Freq: Every day | ORAL | 0 refills | Status: AC

## 2024-05-05 NOTE — Patient Instructions (Addendum)

## 2024-05-05 NOTE — Progress Notes (Signed)
 [name] , 20-Feb-1951, 73 y.o., female MRN: [id number] Patient Care Team    Relationship Specialty Notifications Start End  Catherine Charlies LABOR, DO PCP - General Family Medicine  01/04/16   Gershon Donnice SAUNDERS, DPM Consulting Physician Podiatry  01/12/16   Gillie Duncans, MD Consulting Physician Neurosurgery  10/03/21   [place], Peter M, MD Consulting Physician Cardiology  04/23/23   Gaston Hamilton, MD Consulting Physician Urology  04/23/23   Elnor Mulders, MD Referring Physician Ophthalmology  04/23/23   Lucas Dorise POUR, MD Consulting Physician Cardiothoracic Surgery  04/23/23     Chief Complaint  Patient presents with   Fall    Pt fell over a week ago; hit her side on the way down on her bed frame. Sharp pain. Pt has not tried anything to relieve pain.      Subjective: [name] is a 73 y.o. Pt presents for an OV with complaints of right sided flank pain of 1 week duration after she tripped and fell into the side of the bed rail.  Patient reports the bed rail had struck her right lower back/flank area.  She states she has not noticed any bruising over this area.  Associated symptoms include sharp stabbing pain when taking a deep breath, having a bowel movement, hiccuping or sneezing. She denies any fevers or chills.  She denies any bowel habit changes or melena.  She denies any dizziness. She reports she had 1 episode shortly after injury in which she had a weird taste in her mouth and had nausea and vomiting after eating.  She reports she broke out in a sweat during that time.  This has not occurred since.      01/07/2024   10:22 AM 09/16/2023    9:50 AM 12/04/2022    1:10 PM 09/05/2022   10:22 AM 07/01/2022   10:33 AM  Depression screen PHQ 2/9  Decreased Interest 0 0 0 0 0  Down, Depressed, Hopeless 0 0 0 0 0  PHQ - 2 Score 0 0 0 0 0  Altered sleeping 0      Tired, decreased energy 0      Change in appetite 0      Feeling bad or failure about yourself  0      Trouble  concentrating 0      Moving slowly or fidgety/restless 0      Suicidal thoughts 0      PHQ-9 Score 0      Difficult doing work/chores Not difficult at all        Allergies  Allergen Reactions   Sulfa Antibiotics    Social History   Social History Narrative   Widower 2018 (Damon). 2 sons.   College. Retired.    Former smoker (quit 04/2005- 30+ pack year), occasional etoh, no drugs   Drinks caffeine, uses herbal remedies, daily vitamin use   Wears her seatbelt, wears her bicycle helmet   Exercises routinely   Smoke detector in the home.    Firearms in the home (locked)   Feels safe in her relationships.    Past Medical History:  Diagnosis Date   Allergic rhinitis    Astigmatism    right eye   Back pain    Dr. Marchelle ([facility])   Basal cell carcinoma    Closed fracture of fifth metatarsal bone of left foot 04/12/2022   Combined form of age-related cataract, both eyes    Combined forms  of age-related cataract of right eye 06/03/2019   Cystocele    Demyelinating disease (HCC) 2003   No definitive workup completed; prior records   Depression    Disorder of refraction    both eyes   Early stage nonexudative age-related macular degeneration of both eyes    Exophoria    Fibrocystic breast    GERD (gastroesophageal reflux disease)    History of echocardiogram 12/2012   Dr. 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   Posterior vitreous detachment, both eyes    Postmenopausal    Pseudophakia of left eye    Psoriasis 2009   Regular astigmatism, right eye 06/03/2019   S/P lumbar discectomy 09/24/2021   Sciatica of left side 05/2013   TIA (transient ischemic attack) 2000   Urinary incontinence    Past Surgical History:  Procedure Laterality  Date   CHOLECYSTECTOMY  11/2001   LUMBAR DISC SURGERY  04/2005   R L4-5  and L5-S1   SKIN BIOPSY Left 1999   left hand bcc vs scc   TONSILLECTOMY AND ADENOIDECTOMY     Family History  Problem Relation Age of Onset   Rheum arthritis Mother    Alcohol abuse Mother    Heart disease Father 51       MI at 74; died   Diabetes Father    Dementia Maternal Grandmother    Allergies as of 05/05/2024       Reactions   Sulfa Antibiotics         Medication List        Accurate as of May 05, 2024  3:07 PM. If you have any questions, ask your nurse or doctor.          albuterol  108 (90 Base) MCG/ACT inhaler Commonly known as: VENTOLIN  HFA Inhale 2 puffs into the lungs every 6 (six) hours as needed for wheezing or shortness of breath.   aspirin  EC 81 MG tablet Take 1 tablet (81 mg total) by mouth daily. Swallow whole.   Boostrix 5-2.5-18.5 LF-MCG/0.5 injection Generic drug: Tdap   budesonide -formoterol  160-4.5 MCG/ACT inhaler Commonly known as: SYMBICORT  Inhale 1 puff into the lungs 2 (two) times daily.   estradiol 0.1 MG/GM vaginal cream Commonly known as: ESTRACE Place vaginally.   ezetimibe  10 MG tablet Commonly known as: ZETIA  Take 1 tablet (10 mg total) by mouth daily.   GEMTESA PO Take by mouth.   hydrochlorothiazide  50 MG tablet Commonly known as: HYDRODIURIL  TAKE 1 TABLET(50 MG) BY MOUTH DAILY   losartan  100 MG tablet Commonly known as: COZAAR  Take 1 tablet (100 mg total) by mouth daily.   metoprolol  succinate 25 MG 24 hr tablet Commonly known as: TOPROL -XL Take 1 tablet (25 mg total) by mouth daily.   MULTIVITAMIN ADULT PO Take 1 Dose by mouth.   potassium chloride  SA 20 MEQ tablet Commonly known as: KLOR-CON  M Take 1 tablet (20 mEq total) by mouth daily.   solifenacin 5 MG tablet Commonly known as: VESICARE Take 5 mg by mouth daily.   tiZANidine  4 MG tablet Commonly known as: Zanaflex  Take 0.5-1 tablets (2-4 mg total) by mouth at  bedtime. Started by: Charlies Bellini        All past medical history, surgical history, allergies, family history, immunizations andmedications were updated in  the EMR today and reviewed under the history and medication portions of their EMR.     ROS Negative, with the exception of above mentioned in HPI   Objective:  BP 124/80   Pulse 85   Temp 98.2 F (36.8 C)   Wt 157 lb 9.6 oz (71.5 kg)   SpO2 97%   BMI 27.05 kg/m  Body mass index is 27.05 kg/m.  Physical Exam Vitals and nursing note reviewed.  Constitutional:      General: She is not in acute distress.    Appearance: Normal appearance. She is normal weight. She is not ill-appearing, toxic-appearing or diaphoretic.  HENT:     Head: Normocephalic and atraumatic.  Eyes:     General: No scleral icterus.       Right eye: No discharge.        Left eye: No discharge.     Extraocular Movements: Extraocular movements intact.     Conjunctiva/sclera: Conjunctivae normal.     Pupils: Pupils are equal, round, and reactive to light.  Cardiovascular:     Rate and Rhythm: Normal rate and regular rhythm.  Pulmonary:     Effort: Pulmonary effort is normal. No respiratory distress.     Breath sounds: Normal breath sounds. No wheezing, rhonchi or rales.     Comments: Guarding with deep breath Abdominal:     General: Abdomen is flat. Bowel sounds are normal. There is no distension.     Palpations: Abdomen is soft. There is no mass.     Tenderness: There is abdominal tenderness. There is right CVA tenderness and guarding. There is no left CVA tenderness or rebound.     Hernia: No hernia is present.   Musculoskeletal:       Back:     Right lower leg: No edema.     Left lower leg: No edema.  Skin:    General: Skin is warm.     Findings: No rash.  Neurological:     Mental Status: She is alert and oriented to person, place, and time. Mental status is at baseline.     Motor: No weakness.     Coordination: Coordination normal.      Gait: Gait normal.  Psychiatric:        Mood and Affect: Mood normal.        Behavior: Behavior normal.        Thought Content: Thought content normal.        Judgment: Judgment normal.     No results found. No results found. No results found for this or any previous visit (from the past 24 hours).  Assessment/Plan: [name] is a 73 y.o. female present for OV for  Injury of abdomen, initial encounter (Primary)/right flank pain Patient with blunt trauma to her right flank area over 1 week ago-with worsening symptoms No bruising or erythema present on exam.  Patient is tender to palpation right flank along free-floating ribs posteriorly and anteriorly.  She endorses stabbing pain in the right anterior abdomen with deep breath, on exam today. We discussed differential diagnosis today of rib fracture, other intra-abdominal/organ injury versus muscle strain Pain: She reports she has tramadol  at home if needed.  Muscle relaxant prescribed today in the event this is a muscle skeletal strain -Zanaflex  2-4 mg nightly prescribed -CBC and CMP collected today -Stat CT abdomen pelvis ordered Follow-up dependent upon laboratory results and CT result  Reviewed expectations re: course of current medical issues. Discussed self-management of symptoms.  Outlined signs and symptoms indicating need for more acute intervention. Patient verbalized understanding and all questions were answered. Patient received an After-Visit Summary.    Orders Placed This Encounter  Procedures   CT ABDOMEN PELVIS W CONTRAST   Comp Met (CMET)   CBC   Meds ordered this encounter  Medications   tiZANidine  (ZANAFLEX ) 4 MG tablet    Sig: Take 0.5-1 tablets (2-4 mg total) by mouth at bedtime.    Dispense:  30 tablet    Refill:  0   Referral Orders  No referral(s) requested today     Note is dictated utilizing voice recognition software. Although note has been proof read prior to signing, occasional  typographical errors still can be missed. If any questions arise, please do not hesitate to call for verification.   electronically signed by:  Charlies Bellini, DO  [facility] - [place]

## 2024-05-06 ENCOUNTER — Ambulatory Visit: Payer: Self-pay | Admitting: Family Medicine

## 2024-05-06 LAB — CBC
HCT: 45.6 % (ref 36.0–46.0)
Hemoglobin: 15.8 g/dL — ABNORMAL HIGH (ref 12.0–15.0)
MCHC: 34.7 g/dL (ref 30.0–36.0)
MCV: 96.1 fl (ref 78.0–100.0)
Platelets: 160 K/uL (ref 150.0–400.0)
RBC: 4.75 Mil/uL (ref 3.87–5.11)
RDW: 13 % (ref 11.5–15.5)
WBC: 10.5 K/uL (ref 4.0–10.5)

## 2024-05-06 LAB — COMPREHENSIVE METABOLIC PANEL WITH GFR
ALT: 12 U/L (ref 0–35)
AST: 18 U/L (ref 0–37)
Albumin: 4.4 g/dL (ref 3.5–5.2)
Alkaline Phosphatase: 72 U/L (ref 39–117)
BUN: 31 mg/dL — ABNORMAL HIGH (ref 6–23)
CO2: 30 meq/L (ref 19–32)
Calcium: 9.5 mg/dL (ref 8.4–10.5)
Chloride: 100 meq/L (ref 96–112)
Creatinine, Ser: 0.53 mg/dL (ref 0.40–1.20)
GFR: 90.85 mL/min (ref >60.00)
Glucose, Bld: 98 mg/dL (ref 70–99)
Potassium: 3.9 meq/L (ref 3.5–5.1)
Sodium: 138 meq/L (ref 135–145)
Total Bilirubin: 0.8 mg/dL (ref 0.2–1.2)
Total Protein: 6.9 g/dL (ref 6.0–8.3)

## 2024-05-07 NOTE — Telephone Encounter (Signed)
 Pt advised of results.

## 2024-08-11 ENCOUNTER — Ambulatory Visit

## 2024-08-16 ENCOUNTER — Ambulatory Visit

## 2024-08-18 ENCOUNTER — Other Ambulatory Visit: Payer: Self-pay | Admitting: Acute Care

## 2024-08-18 DIAGNOSIS — Z122 Encounter for screening for malignant neoplasm of respiratory organs: Secondary | ICD-10-CM

## 2024-08-18 DIAGNOSIS — F17210 Nicotine dependence, cigarettes, uncomplicated: Secondary | ICD-10-CM

## 2024-08-18 DIAGNOSIS — Z87891 Personal history of nicotine dependence: Secondary | ICD-10-CM

## 2024-08-25 ENCOUNTER — Ambulatory Visit

## 2024-09-20 ENCOUNTER — Ambulatory Visit: Admitting: Family Medicine

## 2025-01-12 ENCOUNTER — Encounter
